# Patient Record
Sex: Male | Born: 1950 | Race: Black or African American | Hispanic: No | Marital: Single | Smoking: Former smoker
Health system: Southern US, Community
[De-identification: ages and names within clinical notes are randomized; demographics above are authoritative.]

## PROBLEM LIST (undated history)

## (undated) DIAGNOSIS — F1910 Other psychoactive substance abuse, uncomplicated: Secondary | ICD-10-CM

## (undated) DIAGNOSIS — I5022 Chronic systolic (congestive) heart failure: Secondary | ICD-10-CM

## (undated) DIAGNOSIS — I700 Atherosclerosis of aorta: Secondary | ICD-10-CM

## (undated) DIAGNOSIS — N189 Chronic kidney disease, unspecified: Secondary | ICD-10-CM

## (undated) DIAGNOSIS — E039 Hypothyroidism, unspecified: Secondary | ICD-10-CM

## (undated) DIAGNOSIS — I2510 Atherosclerotic heart disease of native coronary artery without angina pectoris: Secondary | ICD-10-CM

## (undated) DIAGNOSIS — G4730 Sleep apnea, unspecified: Secondary | ICD-10-CM

## (undated) DIAGNOSIS — I10 Essential (primary) hypertension: Secondary | ICD-10-CM

## (undated) DIAGNOSIS — Z952 Presence of prosthetic heart valve: Secondary | ICD-10-CM

## (undated) DIAGNOSIS — H9190 Unspecified hearing loss, unspecified ear: Secondary | ICD-10-CM

## (undated) DIAGNOSIS — J449 Chronic obstructive pulmonary disease, unspecified: Secondary | ICD-10-CM

## (undated) DIAGNOSIS — M1990 Unspecified osteoarthritis, unspecified site: Secondary | ICD-10-CM

## (undated) DIAGNOSIS — E079 Disorder of thyroid, unspecified: Secondary | ICD-10-CM

## (undated) HISTORY — PX: LIVER BIOPSY: SHX301

## (undated) HISTORY — DX: Chronic obstructive pulmonary disease, unspecified: J44.9

## (undated) HISTORY — DX: Atherosclerosis of aorta: I70.0

## (undated) HISTORY — DX: Chronic systolic (congestive) heart failure: I50.22

## (undated) HISTORY — DX: Atherosclerotic heart disease of native coronary artery without angina pectoris: I25.10

## (undated) HISTORY — DX: Presence of prosthetic heart valve: Z95.2

## (undated) HISTORY — PX: COLONOSCOPY: SHX174

## (undated) HISTORY — DX: Chronic kidney disease, unspecified: N18.9

## (undated) HISTORY — DX: Disorder of thyroid, unspecified: E07.9

---

## 2012-06-07 ENCOUNTER — Encounter (HOSPITAL_COMMUNITY): Payer: Self-pay | Admitting: *Deleted

## 2012-06-07 ENCOUNTER — Emergency Department (HOSPITAL_COMMUNITY)
Admission: EM | Admit: 2012-06-07 | Discharge: 2012-06-07 | Disposition: A | Discharge status: Home / Self Care | Payer: Medicare Other | Attending: Emergency Medicine | Admitting: Emergency Medicine

## 2012-06-07 DIAGNOSIS — E119 Type 2 diabetes mellitus without complications: Secondary | ICD-10-CM | POA: Insufficient documentation

## 2012-06-07 DIAGNOSIS — K0889 Other specified disorders of teeth and supporting structures: Secondary | ICD-10-CM

## 2012-06-07 DIAGNOSIS — F17200 Nicotine dependence, unspecified, uncomplicated: Secondary | ICD-10-CM | POA: Insufficient documentation

## 2012-06-07 DIAGNOSIS — K089 Disorder of teeth and supporting structures, unspecified: Secondary | ICD-10-CM | POA: Insufficient documentation

## 2012-06-07 MED ORDER — HYDROCODONE-ACETAMINOPHEN 5-325 MG PO TABS: 1.0000 | ORAL_TABLET | ORAL | Status: AC | PRN

## 2012-06-07 MED ORDER — HYDROCODONE-ACETAMINOPHEN 5-325 MG PO TABS
2.0000 | ORAL_TABLET | Freq: Once | ORAL | Status: AC
  Administered 2012-06-07: 2 via ORAL
  Filled 2012-06-07: qty 2

## 2012-06-07 NOTE — ED Provider Notes (Signed)
History     CSN: [id number]  Arrival date & time 06/07/12  0516   First MD Initiated Contact with Patient 06/07/12 [phone]      Chief Complaint  Patient presents with  . Dental Pain    (Consider location/radiation/quality/duration/timing/severity/associated sxs/prior treatment) HPI  Gordon Robinson is a 61 y.o. male who presents to the Emergency Department complaining of dental pain that has been worse since yesterday. [facility] and begun on Keflex and naprosyn. Last night the tooth became so painful he was unable to sleep. Denies fever, chills, facial swelling, difficulty swallowing.   [facility]  Past Medical History  Diagnosis Date  . Diabetes mellitus     History reviewed. No pertinent past surgical history.  History reviewed. No pertinent family history.  History  Substance Use Topics  . Smoking status: Current Everyday Smoker -- 1.0 packs/day  . Smokeless tobacco: Not on file  . Alcohol Use: No      Review of Systems  Constitutional: Negative for fever.       10 Systems reviewed and are negative for acute change except as noted in the HPI.  HENT: Positive for dental problem. Negative for congestion.   Eyes: Negative for discharge and redness.  Respiratory: Negative for cough and shortness of breath.   Cardiovascular: Negative for chest pain.  Gastrointestinal: Negative for vomiting and abdominal pain.  Musculoskeletal: Negative for back pain.  Skin: Negative for rash.  Neurological: Negative for syncope, numbness and headaches.  Psychiatric/Behavioral:       No behavior change.    Allergies  Review of patient's allergies indicates no known allergies.  Home Medications  No current outpatient prescriptions on file.  BP 157/102  Pulse 60  Temp 97.9 F (36.6 C) (Oral)  Resp 18  Ht 5\' 11"  (1.803 m)  Wt 200 lb (90.719 kg)  BMI 27.89 kg/m2  SpO2 100%  Physical Exam  Nursing note and vitals reviewed. Constitutional:  He appears well-developed and well-nourished.       Awake, alert, nontoxic appearance.  HENT:  Head: Atraumatic.       Dentition in fair repair with multiple fillings. Top 1st molar with missing filling. No abscess noted. No swelling of gums.   Eyes: Right eye exhibits no discharge. Left eye exhibits no discharge.  Neck: Neck supple.  Cardiovascular: Normal heart sounds.   Pulmonary/Chest: Effort normal and breath sounds normal. He exhibits no tenderness.  Abdominal: Soft. There is no tenderness. There is no rebound.  Musculoskeletal: He exhibits no tenderness.       Baseline ROM, no obvious new focal weakness.  Neurological:       Mental status and motor strength appears baseline for patient and situation.  Skin: No rash noted.  Psychiatric: He has a normal mood and affect.    ED Course  Procedures (including critical care time)    MDM  Patient with dental pain on antibiotics and antiinflammatory. Given analgesic. [facility]. Pt stable in ED with no significant deterioration in condition.The patient appears reasonably screened and/or stabilized for discharge and I doubt any other medical condition or other [facility] requiring further screening, evaluation, or treatment in the ED at this time prior to discharge.  MDM Reviewed: nursing note and vitals           Nicoletta Dress. Colon Branch, MD 06/07/12 [phone]

## 2012-06-07 NOTE — ED Notes (Signed)
Pt c/o pain to upper right side of jaw. Pt states he began to hurt yesterday but pain controled by medication. Pt states pain med not working this am.

## 2015-09-02 ENCOUNTER — Encounter (HOSPITAL_COMMUNITY): Payer: Self-pay | Admitting: Nurse Practitioner

## 2015-09-02 ENCOUNTER — Emergency Department (HOSPITAL_COMMUNITY)
Admission: EM | Admit: 2015-09-02 | Discharge: 2015-09-02 | Disposition: A | Discharge status: Home / Self Care | Payer: Medicare Other | Attending: Emergency Medicine | Admitting: Emergency Medicine

## 2015-09-02 ENCOUNTER — Emergency Department (HOSPITAL_COMMUNITY): Payer: Medicare Other

## 2015-09-02 DIAGNOSIS — F10129 Alcohol abuse with intoxication, unspecified: Secondary | ICD-10-CM | POA: Diagnosis not present

## 2015-09-02 DIAGNOSIS — S199XXA Unspecified injury of neck, initial encounter: Secondary | ICD-10-CM | POA: Diagnosis not present

## 2015-09-02 DIAGNOSIS — S0990XA Unspecified injury of head, initial encounter: Secondary | ICD-10-CM | POA: Insufficient documentation

## 2015-09-02 DIAGNOSIS — S0081XA Abrasion of other part of head, initial encounter: Secondary | ICD-10-CM | POA: Diagnosis not present

## 2015-09-02 DIAGNOSIS — R0600 Dyspnea, unspecified: Secondary | ICD-10-CM | POA: Diagnosis not present

## 2015-09-02 DIAGNOSIS — Y9389 Activity, other specified: Secondary | ICD-10-CM | POA: Insufficient documentation

## 2015-09-02 DIAGNOSIS — Y9289 Other specified places as the place of occurrence of the external cause: Secondary | ICD-10-CM | POA: Insufficient documentation

## 2015-09-02 DIAGNOSIS — IMO0002 Reserved for concepts with insufficient information to code with codable children: Secondary | ICD-10-CM

## 2015-09-02 DIAGNOSIS — Z23 Encounter for immunization: Secondary | ICD-10-CM | POA: Diagnosis not present

## 2015-09-02 DIAGNOSIS — S0993XA Unspecified injury of face, initial encounter: Secondary | ICD-10-CM | POA: Diagnosis present

## 2015-09-02 DIAGNOSIS — F17200 Nicotine dependence, unspecified, uncomplicated: Secondary | ICD-10-CM | POA: Diagnosis not present

## 2015-09-02 DIAGNOSIS — Y998 Other external cause status: Secondary | ICD-10-CM | POA: Insufficient documentation

## 2015-09-02 DIAGNOSIS — E119 Type 2 diabetes mellitus without complications: Secondary | ICD-10-CM | POA: Diagnosis not present

## 2015-09-02 LAB — ETHANOL: ALCOHOL ETHYL (B): 293 mg/dL — AB (ref <5)

## 2015-09-02 MED ORDER — SODIUM CHLORIDE 0.9 % IV SOLN: Freq: Once | INTRAVENOUS | Status: DC

## 2015-09-02 MED ORDER — TETANUS-DIPHTH-ACELL PERTUSSIS 5-2.5-18.5 LF-MCG/0.5 IM SUSP
0.5000 mL | Freq: Once | INTRAMUSCULAR | Status: AC
  Administered 2015-09-02: 0.5 mL via INTRAMUSCULAR
  Filled 2015-09-02: qty 0.5
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
## 2015-09-02 NOTE — ED Notes (Signed)
Resting quietly with eye closed. Easily arousable. Verbally responsive. Resp even and unlabored. ABC's intact. NAD noted.  

## 2015-09-02 NOTE — ED Notes (Signed)
Pt returned from CT °

## 2015-09-02 NOTE — ED Notes (Addendum)
Pt awake. Verbally responsive. Resp even and unlabored. No audible adventitious breath sounds noted. Pt eaten 100% of breakfast. Ambulated to bathroom with steady gait. Removed IV 18g from LAC. Pt tolerated well.

## 2015-09-02 NOTE — ED Notes (Signed)
Pt had taken off c collar,  Advised multiple times to adhere to medical advise, keep collar on,  Lay on bed
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
## 2015-09-02 NOTE — Discharge Instructions (Signed)
Abrasion  ° °An abrasion is a cut or scrape on the outer surface of your skin. An abrasion does not extend through all of the layers of your skin. It is important to care for your abrasion properly to prevent infection.  °CAUSES  °Most abrasions are caused by falling on or gliding across the ground or another surface. When your skin rubs on something, the outer and inner layer of skin rubs off.  °SYMPTOMS  °A cut or scrape is the main symptom of this condition. The scrape may be bleeding, or it may appear red or pink. If there was an associated fall, there may be an underlying bruise.  °DIAGNOSIS  °An abrasion is diagnosed with a physical exam.  °TREATMENT  °Treatment for this condition depends on how large and deep the abrasion is. Usually, your abrasion will be cleaned with water and mild soap. This removes any dirt or debris that may be stuck. An antibiotic ointment may be applied to the abrasion to help prevent infection. A bandage (dressing) may be placed on the abrasion to keep it clean.  °You may also need a tetanus shot.  °HOME CARE INSTRUCTIONS  °Medicines  °Take or apply medicines only as directed by your health care provider.  °If you were prescribed an antibiotic ointment, finish all of it even if you start to feel better. °Wound Care  °Clean the wound with mild soap and water 2-3 times per day or as directed by your health care provider. Pat your wound dry with a clean towel. Do not rub it.  °There are many different ways to close and cover a wound. Follow instructions from your health care provider about:  °Wound care.  °Dressing changes and removal. °Check your wound every day for signs of infection. Watch for:  °Redness, swelling, or pain.  °Fluid, blood, or pus. °General Instructions  ° °Keep the dressing dry as directed by your health care provider. Do not take baths, swim, use a hot tub, or do anything that would put your wound underwater until your health care provider approves.  °If there is  swelling, raise (elevate) the injured area above the level of your heart while you are sitting or lying down.  °Keep all follow-up visits as directed by your health care provider. This is important. °SEEK MEDICAL CARE IF:  °You received a tetanus shot and you have swelling, severe pain, redness, or bleeding at the injection site.  °Your pain is not controlled with medicine.  °You have increased redness, swelling, or pain at the site of your wound. °SEEK IMMEDIATE MEDICAL CARE IF:  °You have a red streak going away from your wound.  °You have a fever.  °You have fluid, blood, or pus coming from your wound.  °You notice a bad smell coming from your wound or your dressing. °This information is not intended to replace advice given to you by your health care provider. Make sure you discuss any questions you have with your health care provider.  °Document Released: 07/30/2005 Document Revised: 07/11/2015 Document Reviewed: 10/18/2014  °Elsevier Interactive Patient Education ©2016 Elsevier Inc.  ° ° °Emergency Department Resource Guide °1) Find a Doctor and Pay Out of Pocket °Although you won't have to find out who is covered by your insurance plan, it is a good idea to ask around and get recommendations. You will then need to call the office and see if the doctor you have chosen will accept you as a new patient and what types of   options they offer for patients who are self-pay. Some doctors offer discounts or will set up payment plans for their patients who do not have insurance, but you will need to ask so you aren't surprised when you get to your appointment. ° °2) Contact Your Local Health Department °Not all health departments have doctors that can see patients for sick visits, but many do, so it is worth a call to see if yours does. If you don't know where your local health department is, you can check in your phone book. The CDC also has a tool to help you locate your state's health department, and many state  websites also have listings of all of their local health departments. ° °3) Find a Walk-in Clinic °If your illness is not likely to be very severe or complicated, you may want to try a walk in clinic. These are popping up all over the country in pharmacies, drugstores, and shopping centers. They're usually staffed by nurse practitioners or physician assistants that have been trained to treat common illnesses and complaints. They're usually fairly quick and inexpensive. However, if you have serious medical issues or chronic medical problems, these are probably not your best option. ° °No Primary Care Doctor: °- Call [facility] at  832-8000 - they can help you locate a primary care doctor that  accepts your insurance, provides certain services, etc. °- Physician Referral Service- 1-800-533-3463 ° °Chronic Pain Problems: °Organization         Address  Phone   Notes  °[facility]  (336) 297-2271 Patients need to be referred by their primary care doctor.  ° °Medication Assistance: °Organization         Address  Phone   Notes  °[place] [facility] [address] °[place], [place] [address] (336) 641-8030 --Must be a resident of [place] °-- Must have NO insurance coverage whatsoever (no Medicaid/ Medicare, etc.) °-- The pt. MUST have a primary care doctor that directs their care regularly and follows them in the community °  °[facility]  (866) 331-1348   °[employer]  (888) 892-1162   ° °Agencies that provide inexpensive medical care: °Organization         Address  Phone   Notes  °[facility]  (336) 832-8035   °[facility]    (336) 832-7272   °[facility] [address] °[place], [place] [address] (336) 832-4777   °[facility] [address], °[place] (336) 271-4999   °[facility]    (336) 373-0678   °[facility]    (336) 272-1050   °[facility] ° [address], [place] Phone:  (336) 832-4444, Fax:  (336) 832-4440 Hours of Operation:  9 am - 6 pm, M-F.  Also accepts Medicaid/Medicare and self-pay.  °[facility] ° [address], [place] Phone: (336) 832-3150, Fax: (336) [phone]. Hours of Operation:  8:30 am - 5:30 pm, M-F.  Also accepts Medicaid and self-pay.  °[facility] [place] [address], [place] Phone: (336) 878-6027   °[facility] [address], [place], [place] (336)723-1848, Ext. 123 Mondays & Thursdays: 7-9 AM.  First 15 patients are seen on a first come, first serve basis. °  ° °Medicaid-accepting [place] Providers: ° °Organization         Address  Phone   Notes  °  [facility] [address], [place] (336) 641-2100 Also accepts self-pay patients.  °[facility] [address], [place] ° (336) 856-9996   °[facility] [address], [place] (336) 288-8857   °[facility] [address] [place] [address], [place] (336) 299-7000   °Veita Bland [address], [place]  ° (336) 373-1557 Only accepts [place] Access Medicaid patients after they have their name applied to their card.  ° °Self-Pay (no insurance) in [place]: ° °Organization         Address  Phone   Notes  °Sickle Cell Patients, [facility] [address], [place] (336) 832-1970   °[facility] [address], [place] (336) 832-4400   °[facility] ° [address], [place] (336) 992-4800   °[facility]/Dr. Osei-Bonsu ° [address] [place] [address], [place] or [address], [place] (336) [phone] Phone number for both [place] and [place] locations is the same.  °[facility] [address], [place] (336) 299-0000   °[facility] [address] [place] [address], [place] or [address] (336) 852-7530 °(336) 878-2260   °[facility] [address], [place] (336) 350-1642, phone; (336) 294-5005, fax Sees patients 1st and 3rd Saturday of every month.  Must not qualify for public or private insurance (i.e. Medicaid, Medicare, [place] Health Choice, Veterans' Benefits) • Household income should be no more than 200% of the poverty level •The clinic cannot treat you if you are pregnant or think you are pregnant • Sexually transmitted diseases are not treated at the clinic.  ° ° °Dental Care: °Organization         Address  Phone  Notes  °[facility] [address],  (336) 641-6152 Accepts children up to age 21 who are enrolled in Medicaid or [place] Health Choice; pregnant women with a Medicaid card; and children who have applied for Medicaid or [place] Health Choice, but were declined, whose parents can pay a reduced fee at time of service.  °[facility] [place]  [address], [place] (336) 641-7733 Accepts children up to age 21 who are enrolled in Medicaid or [place] Health Choice; pregnant women with a Medicaid card; and children who have applied for Medicaid or [place] Health Choice, but were declined, whose parents can pay a reduced fee at time of service.  °[facility] ° [address],  (336) 641-4533 Patients are seen by appointment only. Walk-ins are not accepted. [facility] will see patients 18 years of age and older. °Monday - Tuesday (8am-5pm) °Most Wednesdays (8:30-5pm) °$30 per visit, cash only  °[facility] ° [address], [place] (336) [phone] Patients are seen by appointment only. Walk-ins are not accepted. [facility] will see patients 18 years of age and older. °One Wednesday Evening (Monthly: Volunteer Based).  $30 per visit, cash only  °[facility]  (919) [phone] for adults; Children under age 4, call Graduate Pediatric Dentistry at ([phone]) [phone]. Children aged 4-14, please call ([phone]) [phone] to request a pediatric application. ° Dental services are provided in all areas of dental care including fillings, crowns and bridges, complete and partial   dentures, implants, gum treatment, root canals, and extractions. Preventive care is also provided. Treatment is provided to both adults and children. °Patients are selected via a lottery and there is often a waiting list. °  °[facility] [address], °[place] ° (336) 763-8833 [email or web address] °  °[facility] [address], [place], [place] (336)723-1848, Ext. 123 Second and Fourth Thursday of each month, opens at 6:30 AM; Clinic ends at 9 AM.  Patients are seen on a first-come first-served basis, and a limited number are seen during each clinic.  ° °[facility] ° [address], [place], [place] (336) [phone]   Eligibility Requirements °You must have lived in [place], [place], or [place] counties for at least the last three months. °  You cannot be eligible for state or federal sponsored healthcare insurance, including Veterans Administration, Medicaid, or Medicare. °  You generally cannot be eligible for healthcare insurance through your employer.  °  How to apply: °Eligibility screenings are held every Tuesday and Wednesday afternoon from 1:00 pm until 4:00 pm. You do not need an appointment for the interview!  °[facility] [address], [place], [place] 336-631-2330   °[facility]  336-342-8273   °[facility]  336-703-3100   °[facility]  336-570-6415   ° °Behavioral Health Resources in the Community: °Intensive Outpatient Programs °Organization         Address  Phone  Notes  °[facility] [address], [place], [place]  336-878-6098   °[facility] [address], [place], [place] 336-832-9800   °[facility] [address], [place], [place] ° 336-882-2125   °[facility] [address],  °[place], [place] 1-800-853-5163 or 336-641-4981   °Substance Abuse Resources °Organization         Address  Phone  Notes  °[facility]  336-882-2125   °[facility]  336-784-9470   °[facility]  336-285-9073   °[facility]  336-845-3988   °Residential & Outpatient Substance Abuse Program  1-800-659-3381   °Psychological Services °Organization         Address  Phone  Notes  °[facility]  336- 832-9600   °[facility]  336- 378-7881   °[facility] [address], [place] 1-800-853-5163 or 336-641-4981   ° °Mobile Crisis Teams °Organization         Address  Phone  Notes  °[facility], Mobile Crisis Care Unit  1-877-626-1772   °Assertive °[facility] ° [address] [place], [place] 336-834-9664   °Sharon DeEsch [address] °[place] 336-554-5454   ° °Self-Help/Support Groups °Organization         Address  Phone             Notes  °[facility] [place] - variety of support groups  336- 373-1402 Call for more information  °Narcotics Anonymous (NA), [facility] [address], °[place]  2 meetings at this location  ° °Residential Treatment Programs °Organization         Address  Phone  Notes  °[facility] [address],    °[place]  1-866-801-8205   °[facility] ° [address], [place], [place] 704-293-8524   °[facility] [address], [place] 336-845-3988 Admissions: 8am-3pm M-F  °[facility] [address]    °[place], [place] 336-841-1104   °[facility] [address], [place], [place] 336-379-7146   °[facility] [address]    °[place], [place] 336-285-9073   °[facility] Intensive Outpatient [address], [place],  [place] 336-852-3033   °[facility] [address]  °[place], [place] 1-877-615-2722 or 336-784-9470   °[facility] [address] [place], [place] 336-227-7417 Accepts Medicaid  °[facility] [address]  ° [place] 1-800-659-3381 Substance Abuse/Addiction Treatment  ° °[place] Behavioral Health Resources °Organization         Address  Phone  Notes  °[facility]  (888) 581-9988   °Julie Brannon, PhD [address] [place], [place]   (336) 349-5553 or (336) 951-0000   °[facility]   [address] °[place], [place] (336) 349-4454   °[facility] [address], [place], [place] (336) 342-8316 Insurance/Medicaid/sponsorship through [facility]  °[facility] [address]                                    [place], [place] (336) 342-8316 Therapy/tele-psych/case  °[facility] [address]  ° [place], [place] (336) 349-2233    °Dr. Arfeen  (336) 349-4544   °[facility] [place]  [employer] [place] [facility] 1) [address], [place] °2) [address], [place] °3)  [address], [place] (336) 349-3220 °(336) 342-7768 ° °(336) 342-8140   °[place] [facility] (336) 342-1394 or (336) 342-3537 (After Hours)    ° ° ° °

## 2015-09-02 NOTE — ED Notes (Signed)
Awake. Verbally responsive. A/O x4. Resp even and unlabored. No audible adventitious breath sounds noted. ABC's intact.  

## 2015-11-24 ENCOUNTER — Encounter (HOSPITAL_COMMUNITY): Payer: Self-pay | Admitting: Emergency Medicine

## 2015-11-24 ENCOUNTER — Emergency Department (HOSPITAL_COMMUNITY)
Admission: EM | Admit: 2015-11-24 | Discharge: 2015-11-24 | Disposition: A | Discharge status: Home / Self Care | Payer: Medicare Other | Attending: Emergency Medicine | Admitting: Emergency Medicine

## 2015-11-24 DIAGNOSIS — S0992XA Unspecified injury of nose, initial encounter: Secondary | ICD-10-CM | POA: Diagnosis present

## 2015-11-24 DIAGNOSIS — S0121XA Laceration without foreign body of nose, initial encounter: Secondary | ICD-10-CM | POA: Diagnosis not present

## 2015-11-24 DIAGNOSIS — Z79899 Other long term (current) drug therapy: Secondary | ICD-10-CM | POA: Diagnosis not present

## 2015-11-24 DIAGNOSIS — Y9289 Other specified places as the place of occurrence of the external cause: Secondary | ICD-10-CM | POA: Diagnosis not present

## 2015-11-24 DIAGNOSIS — S60312A Abrasion of left thumb, initial encounter: Secondary | ICD-10-CM | POA: Insufficient documentation

## 2015-11-24 DIAGNOSIS — F17210 Nicotine dependence, cigarettes, uncomplicated: Secondary | ICD-10-CM | POA: Insufficient documentation

## 2015-11-24 DIAGNOSIS — Y9389 Activity, other specified: Secondary | ICD-10-CM | POA: Insufficient documentation

## 2015-11-24 DIAGNOSIS — S51812A Laceration without foreign body of left forearm, initial encounter: Secondary | ICD-10-CM | POA: Diagnosis not present

## 2015-11-24 DIAGNOSIS — Z794 Long term (current) use of insulin: Secondary | ICD-10-CM | POA: Insufficient documentation

## 2015-11-24 DIAGNOSIS — Y998 Other external cause status: Secondary | ICD-10-CM | POA: Diagnosis not present

## 2015-11-24 DIAGNOSIS — E119 Type 2 diabetes mellitus without complications: Secondary | ICD-10-CM | POA: Diagnosis not present

## 2015-11-24 DIAGNOSIS — Z7951 Long term (current) use of inhaled steroids: Secondary | ICD-10-CM | POA: Diagnosis not present

## 2015-11-24 LAB — I-STAT CHEM 8, ED
BUN: 15 mg/dL (ref 6–20)
CREATININE: 0.9 mg/dL (ref 0.61–1.24)
Calcium, Ion: 1.15 mmol/L (ref 1.13–1.30)
Chloride: 104 mmol/L (ref 101–111)
GLUCOSE: 234 mg/dL — AB (ref 65–99)
HCT: 45 % (ref 39.0–52.0)
HEMOGLOBIN: 15.3 g/dL (ref 13.0–17.0)
POTASSIUM: 3.9 mmol/L (ref 3.5–5.1)
Sodium: 138 mmol/L (ref 135–145)
TCO2: 21 mmol/L (ref 0–100)

## 2015-11-24 MED ORDER — BACITRACIN ZINC 500 UNIT/GM EX OINT
TOPICAL_OINTMENT | Freq: Once | CUTANEOUS | Status: AC
  Administered 2015-11-24: 14:00:00 via TOPICAL
  Filled 2015-11-24: qty 0.9

## 2015-11-24 NOTE — Discharge Instructions (Signed)
Facial Laceration ° A facial laceration is a cut on the face. These injuries can be painful and cause bleeding. Lacerations usually heal quickly, but they need special care to reduce scarring. °DIAGNOSIS  °Your health care provider will take a medical history, ask for details about how the injury occurred, and examine the wound to determine how deep the cut is. °TREATMENT  °Some facial lacerations may not require closure. Others may not be able to be closed because of an increased risk of infection. The risk of infection and the chance for successful closure will depend on various factors, including the amount of time since the injury occurred. °The wound may be cleaned to help prevent infection. If closure is appropriate, pain medicines may be given if needed. Your health care provider will use stitches (sutures), wound glue (adhesive), or skin adhesive strips to repair the laceration. These tools bring the skin edges together to allow for faster healing and a better cosmetic outcome. If needed, you may also be given a tetanus shot. °HOME CARE INSTRUCTIONS °· Only take over-the-counter or prescription medicines as directed by your health care provider. °· Follow your health care provider's instructions for wound care. These instructions will vary depending on the technique used for closing the wound. °For Sutures: °· Keep the wound clean and dry.   °· If you were given a bandage (dressing), you should change it at least once a day. Also change the dressing if it becomes wet or dirty, or as directed by your health care provider.   °· Wash the wound with soap and water 2 times a day. Rinse the wound off with water to remove all soap. Pat the wound dry with a clean towel.   °· After cleaning, apply a thin layer of the antibiotic ointment recommended by your health care provider. This will help prevent infection and keep the dressing from sticking.   °· You may shower as usual after the first 24 hours. Do not soak the  wound in water until the sutures are removed.   °· Get your sutures removed as directed by your health care provider. With facial lacerations, sutures should usually be taken out after 4-5 days to avoid stitch marks.   °· Wait a few days after your sutures are removed before applying any makeup. °For Skin Adhesive Strips: °· Keep the wound clean and dry.   °· Do not get the skin adhesive strips wet. You may bathe carefully, using caution to keep the wound dry.   °· If the wound gets wet, pat it dry with a clean towel.   °· Skin adhesive strips will fall off on their own. You may trim the strips as the wound heals. Do not remove skin adhesive strips that are still stuck to the wound. They will fall off in time.   °For Wound Adhesive: °· You may briefly wet your wound in the shower or bath. Do not soak or scrub the wound. Do not swim. Avoid periods of heavy sweating until the skin adhesive has fallen off on its own. After showering or bathing, gently pat the wound dry with a clean towel.   °· Do not apply liquid medicine, cream medicine, ointment medicine, or makeup to your wound while the skin adhesive is in place. This may loosen the film before your wound is healed.   °· If a dressing is placed over the wound, be careful not to apply tape directly over the skin adhesive. This may cause the adhesive to be pulled off before the wound is healed.   °· Avoid   prolonged exposure to sunlight or tanning lamps while the skin adhesive is in place. °· The skin adhesive will usually remain in place for 5-10 days, then naturally fall off the skin. Do not pick at the adhesive film.   °After Healing: °Once the wound has healed, cover the wound with sunscreen during the day for 1 full year. This can help minimize scarring. Exposure to ultraviolet light in the first year will darken the scar. It can take 1-2 years for the scar to lose its redness and to heal completely.  °SEEK MEDICAL CARE IF: °· You have a fever. °SEEK IMMEDIATE  MEDICAL CARE IF: °· You have redness, pain, or swelling around the wound.   °· You see a yellowish-white fluid (pus) coming from the wound.   °  °This information is not intended to replace advice given to you by your health care provider. Make sure you discuss any questions you have with your health care provider. °  °Document Released: 11/27/2004 Document Revised: 11/10/2014 Document Reviewed: 06/02/2013 °Elsevier Interactive Patient Education ©2016 [employer]vier Inc. ° °
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
## 2016-01-25 ENCOUNTER — Emergency Department (HOSPITAL_COMMUNITY)
Admission: EM | Admit: 2016-01-25 | Discharge: 2016-01-25 | Disposition: A | Discharge status: Home / Self Care | Payer: Medicare Other | Source: Home / Self Care

## 2016-01-25 ENCOUNTER — Encounter (HOSPITAL_COMMUNITY): Payer: Self-pay | Admitting: Emergency Medicine

## 2016-01-25 ENCOUNTER — Emergency Department (HOSPITAL_COMMUNITY)
Admission: EM | Admit: 2016-01-25 | Discharge: 2016-01-25 | Disposition: A | Discharge status: Home / Self Care | Payer: Medicare Other | Attending: Emergency Medicine | Admitting: Emergency Medicine

## 2016-01-25 ENCOUNTER — Emergency Department (HOSPITAL_COMMUNITY)
Admission: EM | Admit: 2016-01-25 | Discharge: 2016-01-26 | Disposition: A | Discharge status: Home / Self Care | Payer: Medicare Other | Attending: Emergency Medicine | Admitting: Emergency Medicine

## 2016-01-25 ENCOUNTER — Encounter (HOSPITAL_COMMUNITY): Payer: Self-pay

## 2016-01-25 DIAGNOSIS — Z5321 Procedure and treatment not carried out due to patient leaving prior to being seen by health care provider: Secondary | ICD-10-CM | POA: Insufficient documentation

## 2016-01-25 DIAGNOSIS — Z79899 Other long term (current) drug therapy: Secondary | ICD-10-CM | POA: Insufficient documentation

## 2016-01-25 DIAGNOSIS — Z794 Long term (current) use of insulin: Secondary | ICD-10-CM | POA: Insufficient documentation

## 2016-01-25 DIAGNOSIS — F17210 Nicotine dependence, cigarettes, uncomplicated: Secondary | ICD-10-CM | POA: Insufficient documentation

## 2016-01-25 DIAGNOSIS — R739 Hyperglycemia, unspecified: Secondary | ICD-10-CM

## 2016-01-25 DIAGNOSIS — T83038A Leakage of other indwelling urethral catheter, initial encounter: Secondary | ICD-10-CM | POA: Insufficient documentation

## 2016-01-25 DIAGNOSIS — E119 Type 2 diabetes mellitus without complications: Secondary | ICD-10-CM | POA: Insufficient documentation

## 2016-01-25 DIAGNOSIS — T839XXA Unspecified complication of genitourinary prosthetic device, implant and graft, initial encounter: Secondary | ICD-10-CM

## 2016-01-25 DIAGNOSIS — E1165 Type 2 diabetes mellitus with hyperglycemia: Secondary | ICD-10-CM | POA: Insufficient documentation

## 2016-01-25 DIAGNOSIS — Z9114 Patient's other noncompliance with medication regimen: Secondary | ICD-10-CM

## 2016-01-25 DIAGNOSIS — R339 Retention of urine, unspecified: Secondary | ICD-10-CM | POA: Insufficient documentation

## 2016-01-25 DIAGNOSIS — Y732 Prosthetic and other implants, materials and accessory gastroenterology and urology devices associated with adverse incidents: Secondary | ICD-10-CM | POA: Diagnosis not present

## 2016-01-25 DIAGNOSIS — R338 Other retention of urine: Secondary | ICD-10-CM

## 2016-01-25 LAB — URINE MICROSCOPIC-ADD ON
SQUAMOUS EPITHELIAL / LPF: NONE SEEN
WBC, UA: NONE SEEN WBC/hpf (ref 0–5)

## 2016-01-25 LAB — CBC WITH DIFFERENTIAL/PLATELET
Basophils Absolute: 0 10*3/uL (ref 0.0–0.1)
Basophils Relative: 0 %
EOS ABS: 0 10*3/uL (ref 0.0–0.7)
EOS PCT: 0 %
HCT: 42 % (ref 39.0–52.0)
HEMOGLOBIN: 14.2 g/dL (ref 13.0–17.0)
LYMPHS ABS: 1.4 10*3/uL (ref 0.7–4.0)
Lymphocytes Relative: 16 %
MCH: 29.6 pg (ref 26.0–34.0)
MCHC: 33.8 g/dL (ref 30.0–36.0)
MCV: 87.7 fL (ref 78.0–100.0)
MONOS PCT: 7 %
Monocytes Absolute: 0.6 10*3/uL (ref 0.1–1.0)
Neutro Abs: 6.7 10*3/uL (ref 1.7–7.7)
Neutrophils Relative %: 77 %
Platelets: 220 10*3/uL (ref 150–400)
RBC: 4.79 MIL/uL (ref 4.22–5.81)
RDW: 12.4 % (ref 11.5–15.5)
WBC: 8.8 10*3/uL (ref 4.0–10.5)

## 2016-01-25 LAB — URINALYSIS, ROUTINE W REFLEX MICROSCOPIC
BILIRUBIN URINE: NEGATIVE
KETONES UR: NEGATIVE mg/dL
Leukocytes, UA: NEGATIVE
Nitrite: NEGATIVE
PH: 5 (ref 5.0–8.0)
Protein, ur: NEGATIVE mg/dL
Specific Gravity, Urine: 1.005 (ref 1.005–1.030)

## 2016-01-25 LAB — CBG MONITORING, ED
GLUCOSE-CAPILLARY: 408 mg/dL — AB (ref 65–99)
Glucose-Capillary: >600 mg/dL (ref 65–99)

## 2016-01-25 LAB — BASIC METABOLIC PANEL
Anion gap: 12 (ref 5–15)
BUN: 20 mg/dL (ref 6–20)
CHLORIDE: 93 mmol/L — AB (ref 101–111)
CO2: 22 mmol/L (ref 22–32)
CREATININE: 1.3 mg/dL — AB (ref 0.61–1.24)
Calcium: 9.6 mg/dL (ref 8.9–10.3)
GFR calc Af Amer: >60 mL/min (ref >60)
GFR calc non Af Amer: 57 mL/min — ABNORMAL LOW (ref >60)
Glucose, Bld: 778 mg/dL (ref 65–99)
Potassium: 4.8 mmol/L (ref 3.5–5.1)
Sodium: 127 mmol/L — ABNORMAL LOW (ref 135–145)

## 2016-01-25 MED ORDER — INSULIN REGULAR HUMAN 100 UNIT/ML IJ SOLN: 10.0000 [IU] | Freq: Three times a day (TID) | INTRAMUSCULAR | Status: DC

## 2016-01-25 MED ORDER — INSULIN ASPART 100 UNIT/ML ~~LOC~~ SOLN
12.0000 [IU] | Freq: Once | SUBCUTANEOUS | Status: AC
  Administered 2016-01-25: 12 [IU] via INTRAVENOUS
  Filled 2016-01-25: qty 1

## 2016-01-25 MED ORDER — INSULIN GLARGINE 100 UNIT/ML ~~LOC~~ SOLN: 25.0000 [IU] | Freq: Every day | SUBCUTANEOUS | Status: DC

## 2016-01-25 MED ORDER — HYDROGEN PEROXIDE 3 % EX SOLN
CUTANEOUS | Status: AC
  Filled 2016-01-25: qty 473

## 2016-01-25 MED ORDER — SODIUM CHLORIDE 0.9 % IV BOLUS (SEPSIS)
1000.0000 mL | Freq: Once | INTRAVENOUS | Status: AC
  Administered 2016-01-25: 1000 mL via INTRAVENOUS

## 2016-01-25 MED ORDER — SODIUM CHLORIDE 0.9 % IV BOLUS (SEPSIS)
1000.0000 mL | Freq: Once | INTRAVENOUS | Status: AC
Start: 2016-01-25 — End: 2016-01-25
  Administered 2016-01-25: 1000 mL via INTRAVENOUS

## 2016-01-25 NOTE — ED Notes (Signed)
Pt states he has not been able to void for two days. Pt was in the bathroom when called for triage. Stated he was able to void but does no know if his bladder empted

## 2016-01-25 NOTE — ED Notes (Signed)
CRITICAL VALUE ALERT  Critical value received: Glucose 778  Date of notification:  01/25/16  Time of notification:  1008 Critical value read back: Yes  Nurse who received alert: Tonia Ghentobin Knowles RN  MD notified Dr Patria Maneampos

## 2016-01-25 NOTE — ED Notes (Signed)
No answer in waiting areas.

## 2016-01-25 NOTE — ED Notes (Signed)
Pt states his catheter is leaking.

## 2016-01-25 NOTE — Discharge Instructions (Signed)
Acute Urinary Retention, Male Acute urinary retention is the temporary inability to urinate. This is a common problem in older men. As men age their prostates become larger and block the flow of urine from the bladder. This is usually a problem that has come on gradually.  HOME CARE INSTRUCTIONS If you are sent home with a Foley catheter and a drainage system, you will need to discuss the best course of action with your health care provider. While the catheter is in, maintain a good intake of fluids. Keep the drainage bag emptied and lower than your catheter. This is so that contaminated urine will not flow back into your bladder, which could lead to a urinary tract infection. There are two main types of drainage bags. One is a large bag that usually is used at night. It has a good capacity that will allow you to sleep through the night without having to empty it. The second type is called a leg bag. It has a smaller capacity, so it needs to be emptied more frequently. However, the main advantage is that it can be attached by a leg strap and can go underneath your clothing, allowing you the freedom to move about or leave your home. Only take over-the-counter or prescription medicines for pain, discomfort, or fever as directed by your health care provider.  SEEK MEDICAL CARE IF:  You develop a low-grade fever.  You experience spasms or leakage of urine with the spasms. SEEK IMMEDIATE MEDICAL CARE IF:   You develop chills or fever.  Your catheter stops draining urine.  Your catheter falls out.  You start to develop increased bleeding that does not respond to rest and increased fluid intake. MAKE SURE YOU:  Understand these instructions.  Will watch your condition.  Will get help right away if you are not doing well or get worse.   This information is not intended to replace advice given to you by your health care provider. Make sure you discuss any questions you have with your health care  provider.   Document Released: 01/26/2001 Document Revised: 03/06/2015 Document Reviewed: 03/31/2013 Elsevier Interactive Patient Education 2016 [employer]  Hyperglycemia Hyperglycemia occurs when the glucose (sugar) in your blood is too high. Hyperglycemia can happen for many reasons, but it most often happens to people who do not know they have diabetes or are not managing their diabetes properly.  CAUSES  Whether you have diabetes or not, there are other causes of hyperglycemia. Hyperglycemia can occur when you have diabetes, but it can also occur in other situations that you might not be as aware of, such as: Diabetes  If you have diabetes and are having problems controlling your blood glucose, hyperglycemia could occur because of some of the following reasons:  Not following your meal plan.  Not taking your diabetes medications or not taking it properly.  Exercising less or doing less activity than you normally do.  Being sick. Pre-diabetes  This cannot be ignored. Before people develop Type 2 diabetes, they almost always have "pre-diabetes." This is when your blood glucose levels are higher than normal, but not yet high enough to be diagnosed as diabetes. Research has shown that some long-term damage to the body, especially the heart and circulatory system, may already be occurring during pre-diabetes. If you take action to manage your blood glucose when you have pre-diabetes, you may delay or prevent Type 2 diabetes from developing. Stress  If you have diabetes, you may be "diet" controlled or on  oral medications or insulin to control your diabetes. However, you may find that your blood glucose is higher than usual in the hospital whether you have diabetes or not. This is often referred to as "stress hyperglycemia." Stress can elevate your blood glucose. This happens because of hormones put out by the body during times of stress. If stress has been the cause of your high blood  glucose, it can be followed regularly by your caregiver. That way he/she can make sure your hyperglycemia does not continue to get worse or progress to diabetes. Steroids  Steroids are medications that act on the infection fighting system (immune system) to block inflammation or infection. One side effect can be a rise in blood glucose. Most people can produce enough extra insulin to allow for this rise, but for those who cannot, steroids make blood glucose levels go even higher. It is not unusual for steroid treatments to "uncover" diabetes that is developing. It is not always possible to determine if the hyperglycemia will go away after the steroids are stopped. A special blood test called an A1c is sometimes done to determine if your blood glucose was elevated before the steroids were started. SYMPTOMS  Thirsty.  Frequent urination.  Dry mouth.  Blurred vision.  Tired or fatigue.  Weakness.  Sleepy.  Tingling in feet or leg. DIAGNOSIS  Diagnosis is made by monitoring blood glucose in one or all of the following ways:  A1c test. This is a chemical found in your blood.  Fingerstick blood glucose monitoring.  Laboratory results. TREATMENT  First, knowing the cause of the hyperglycemia is important before the hyperglycemia can be treated. Treatment may include, but is not be limited to:  Education.  Change or adjustment in medications.  Change or adjustment in meal plan.  Treatment for an illness, infection, etc.  More frequent blood glucose monitoring.  Change in exercise plan.  Decreasing or stopping steroids.  Lifestyle changes. HOME CARE INSTRUCTIONS   Test your blood glucose as directed.  Exercise regularly. Your caregiver will give you instructions about exercise. Pre-diabetes or diabetes which comes on with stress is helped by exercising.  Eat wholesome, balanced meals. Eat often and at regular, fixed times. Your caregiver or nutritionist will give you a  meal plan to guide your sugar intake.  Being at an ideal weight is important. If needed, losing as little as 10 to 15 pounds may help improve blood glucose levels. SEEK MEDICAL CARE IF:   You have questions about medicine, activity, or diet.  You continue to have symptoms (problems such as increased thirst, urination, or weight gain). SEEK IMMEDIATE MEDICAL CARE IF:   You are vomiting or have diarrhea.  Your breath smells fruity.  You are breathing faster or slower.  You are very sleepy or incoherent.  You have numbness, tingling, or pain in your feet or hands.  You have chest pain.  Your symptoms get worse even though you have been following your caregiver's orders.  If you have any other questions or concerns.   This information is not intended to replace advice given to you by your health care provider. Make sure you discuss any questions you have with your health care provider.   Document Released: 04/15/2001 Document Revised: 01/12/2012 Document Reviewed: 06/26/2015 Elsevier Interactive Patient Education [employer]

## 2016-01-25 NOTE — ED Notes (Signed)
Pt had a foley placed today for urinary retention.  Pt states he thinks it is coming out because it is painful and leaking around the insertion site.  There is pale yellow urine in the bag.

## 2016-01-25 NOTE — ED Notes (Signed)
Pt reports not being able to empty his bladder for 2 days. Pt brought to room from triage, Triage RN bladder scanned pt and placed urinary catheter with approx 1000 ml output. Pt reports relief in symptoms.

## 2016-01-25 NOTE — ED Provider Notes (Signed)
CSN: [id number]     Arrival date & time 01/25/16  0902 History   First MD Initiated Contact with Patient 01/25/16 0919     Chief Complaint  Patient presents with  . Urinary Retention     (Consider location/radiation/quality/duration/timing/severity/associated sxs/prior Treatment) The history is provided by the patient.   Gordon Robinson is a 65 y.o. male with a past medical history of Insulin-dependent diabetes presenting with a 1 day history of urinary retention, stating he last urinated normally early yesterday morning.  Since he has had episodes of incontinent small dribbles of urine but has been unable to fully empty his bladder.  He also endorses increased thirst, chronic dry mouth and fatigue.  He is currently in the middle moving, states his insulin is in a box somewhere so has had no diabetes medications in approximately 1 month.  He denies abdominal pain, nausea or vomiting.  He endorses fatigue, primarily due to inability to sleep last night secondary to bladder distention and pain.  Since arriving here he has obtained a Foley catheter with 1 L of urine resulted, he feels much improved.  Denies prior history of urinary retention or prostate problems or UTI history.    Past Medical History  Diagnosis Date  . Diabetes mellitus    Past Surgical History  Procedure Laterality Date  . Liver biopsy     No family history on file. Social History  Substance Use Topics  . Smoking status: Current Every Day Smoker -- 0.50 packs/day    Types: Cigarettes  . Smokeless tobacco: None  . Alcohol Use: Yes     Comment: occassinally    Review of Systems  Constitutional: Positive for fatigue. Negative for fever and chills.  HENT: Negative for congestion and sore throat.   Eyes: Negative.   Respiratory: Negative for chest tightness and shortness of breath.   Cardiovascular: Negative for chest pain.  Gastrointestinal: Negative for nausea, vomiting, abdominal pain, diarrhea and constipation.   Genitourinary: Positive for difficulty urinating. Negative for hematuria and flank pain.       Pelvic pain  Musculoskeletal: Negative for joint swelling, arthralgias and neck pain.  Skin: Negative.  Negative for rash and wound.  Neurological: Negative for dizziness, weakness, light-headedness, numbness and headaches.  Psychiatric/Behavioral: Negative.       Allergies  Review of patient's allergies indicates no known allergies.  Home Medications   Prior to Admission medications   Medication Sig Start Date End Date Taking? Authorizing Provider  acetaminophen (TYLENOL) 500 MG tablet Take 500 mg by mouth every 6 (six) hours as needed.   Yes Historical Provider, MD  fluticasone (FLONASE) 50 MCG/ACT nasal spray Place 2 sprays into both nostrils daily.   Yes Historical Provider, MD  levothyroxine (SYNTHROID, LEVOTHROID) 112 MCG tablet Take 112 mcg by mouth daily before breakfast.   Yes Historical Provider, MD  insulin glargine (LANTUS) 100 UNIT/ML injection Inject 0.25 mLs (25 Units total) into the skin at bedtime. 01/25/16   Burgess AmorJulie Romolo Sieling, PA-C  insulin regular (NOVOLIN R,HUMULIN R) 100 units/mL injection Inject 0.1 mLs (10 Units total) into the skin 3 (three) times daily before meals. 01/25/16   Burgess AmorJulie Maxden Naji, PA-C   BP 123/79 mmHg  Pulse 77  Temp(Src) 97.9 F (36.6 C) (Oral)  Resp 18  Ht 5\' 11"  (1.803 m)  Wt 68.04 kg  BMI 20.93 kg/m2  SpO2 100% Physical Exam  Constitutional: He appears well-developed and well-nourished.  HENT:  Head: Normocephalic and atraumatic.  Mouth/Throat: Uvula is midline. Mucous membranes  are dry.  Eyes: Conjunctivae are normal.  Neck: Normal range of motion.  Cardiovascular: Normal rate, regular rhythm, normal heart sounds and intact distal pulses.   Pulmonary/Chest: Effort normal and breath sounds normal. He has no wheezes.  Abdominal: Soft. Bowel sounds are normal. He exhibits no distension. There is no tenderness. There is no guarding.  Musculoskeletal:  Normal range of motion.  Neurological: He is alert.  Skin: Skin is warm and dry.  Psychiatric: He has a normal mood and affect.  Nursing note and vitals reviewed.   ED Course  Procedures (including critical care time) Labs Review Labs Reviewed  URINALYSIS, ROUTINE W REFLEX MICROSCOPIC (NOT AT [facility]) - Abnormal; Notable for the following:    Glucose, UA >1000 (*)    Hgb urine dipstick LARGE (*)    All other components within normal limits  BASIC METABOLIC PANEL - Abnormal; Notable for the following:    Sodium 127 (*)    Chloride 93 (*)    Glucose, Bld 778 (*)    Creatinine, Ser 1.30 (*)    GFR calc non Af Amer 57 (*)    All other components within normal limits  URINE MICROSCOPIC-ADD ON - Abnormal; Notable for the following:    Bacteria, UA RARE (*)    All other components within normal limits  CBG MONITORING, ED - Abnormal; Notable for the following:    Glucose-Capillary >600 (*)    All other components within normal limits  CBG MONITORING, ED - Abnormal; Notable for the following:    Glucose-Capillary 408 (*)    All other components within normal limits  CBC WITH DIFFERENTIAL/PLATELET    Imaging Review No results found. I have personally reviewed and evaluated these images and lab results as part of my medical decision-making.   EKG Interpretation None      MDM   Final diagnoses:  Acute urinary retention  Hyperglycemia  H/O medication noncompliance    Patients labs reviewed.    Results were also discussed with patient.  He was given NS 2 liters along with IV regular insulin 12 units with significant improvement in cbg, no dka.  Corrected Na 137.  Pt was prescribed both his regular insulin and his lantus, advised recheck by his pcp and frequent cbg checks, also wrote script for syringes and needles.  He was given a leg bag, foley left in place.  He will need close f/u with urology. Advised recheck on Monday or Tuesday for further eval of the urinary retention.  No  uti today.  He is seen by the [facility]. Unsure if he can get appt that quickly. Referral given for [facility] for f/u in [place] early next week.  Pt agrees with and understands plan.  Discussed patient with Dr. Patria Mane prior to dc home.     Burgess Amor, PA-C 01/25/16 1753  Azalia Bilis, MD 01/26/16 1004

## 2016-01-26 LAB — BASIC METABOLIC PANEL
ANION GAP: 7 (ref 5–15)
BUN: 18 mg/dL (ref 6–20)
CALCIUM: 9 mg/dL (ref 8.9–10.3)
CO2: 26 mmol/L (ref 22–32)
CREATININE: 1.32 mg/dL — AB (ref 0.61–1.24)
Chloride: 94 mmol/L — ABNORMAL LOW (ref 101–111)
GFR, EST NON AFRICAN AMERICAN: 56 mL/min — AB (ref >60)
Glucose, Bld: 800 mg/dL (ref 65–99)
Potassium: 4.9 mmol/L (ref 3.5–5.1)
SODIUM: 127 mmol/L — AB (ref 135–145)

## 2016-01-26 LAB — CBG MONITORING, ED
GLUCOSE-CAPILLARY: 467 mg/dL — AB (ref 65–99)
Glucose-Capillary: 330 mg/dL — ABNORMAL HIGH (ref 65–99)

## 2016-01-26 MED ORDER — INSULIN ASPART 100 UNIT/ML ~~LOC~~ SOLN
5.0000 [IU] | Freq: Once | SUBCUTANEOUS | Status: AC
  Administered 2016-01-26: 5 [IU] via INTRAVENOUS
  Filled 2016-01-26: qty 1

## 2016-01-26 MED ORDER — INSULIN ASPART 100 UNIT/ML ~~LOC~~ SOLN
10.0000 [IU] | Freq: Once | SUBCUTANEOUS | Status: AC
  Administered 2016-01-26: 10 [IU] via INTRAVENOUS
  Filled 2016-01-26: qty 1

## 2016-01-26 MED ORDER — SODIUM CHLORIDE 0.9 % IV BOLUS (SEPSIS)
1000.0000 mL | Freq: Once | INTRAVENOUS | Status: AC
  Administered 2016-01-26: 1000 mL via INTRAVENOUS

## 2016-01-26 MED ORDER — INSULIN ASPART 100 UNIT/ML ~~LOC~~ SOLN
5.0000 [IU] | Freq: Once | SUBCUTANEOUS | Status: AC
  Administered 2016-01-26: 5 [IU] via SUBCUTANEOUS
  Filled 2016-01-26: qty 1

## 2016-01-26 NOTE — Discharge Instructions (Signed)
You need to get your prescriptions filled that you were given earlier today for your insulin and your needles. Follow-up with [facility] as discussed at your prior ED visit. Hyperglycemia Hyperglycemia occurs when the glucose (sugar) in your blood is too high. Hyperglycemia can happen for many reasons, but it most often happens to people who do not know they have diabetes or are not managing their diabetes properly.  CAUSES  Whether you have diabetes or not, there are other causes of hyperglycemia. Hyperglycemia can occur when you have diabetes, but it can also occur in other situations that you might not be as aware of, such as: Diabetes  If you have diabetes and are having problems controlling your blood glucose, hyperglycemia could occur because of some of the following reasons:  Not following your meal plan.  Not taking your diabetes medications or not taking it properly.  Exercising less or doing less activity than you normally do.  Being sick. Pre-diabetes  This cannot be ignored. Before people develop Type 2 diabetes, they almost always have "pre-diabetes." This is when your blood glucose levels are higher than normal, but not yet high enough to be diagnosed as diabetes. Research has shown that some long-term damage to the body, especially the heart and circulatory system, may already be occurring during pre-diabetes. If you take action to manage your blood glucose when you have pre-diabetes, you may delay or prevent Type 2 diabetes from developing. Stress  If you have diabetes, you may be "diet" controlled or on oral medications or insulin to control your diabetes. However, you may find that your blood glucose is higher than usual in the hospital whether you have diabetes or not. This is often referred to as "stress hyperglycemia." Stress can elevate your blood glucose. This happens because of hormones put out by the body during times of stress. If stress has been the cause of your  high blood glucose, it can be followed regularly by your caregiver. That way he/she can make sure your hyperglycemia does not continue to get worse or progress to diabetes. Steroids  Steroids are medications that act on the infection fighting system (immune system) to block inflammation or infection. One side effect can be a rise in blood glucose. Most people can produce enough extra insulin to allow for this rise, but for those who cannot, steroids make blood glucose levels go even higher. It is not unusual for steroid treatments to "uncover" diabetes that is developing. It is not always possible to determine if the hyperglycemia will go away after the steroids are stopped. A special blood test called an A1c is sometimes done to determine if your blood glucose was elevated before the steroids were started. SYMPTOMS  Thirsty.  Frequent urination.  Dry mouth.  Blurred vision.  Tired or fatigue.  Weakness.  Sleepy.  Tingling in feet or leg. DIAGNOSIS  Diagnosis is made by monitoring blood glucose in one or all of the following ways:  A1c test. This is a chemical found in your blood.  Fingerstick blood glucose monitoring.  Laboratory results. TREATMENT  First, knowing the cause of the hyperglycemia is important before the hyperglycemia can be treated. Treatment may include, but is not be limited to:  Education.  Change or adjustment in medications.  Change or adjustment in meal plan.  Treatment for an illness, infection, etc.  More frequent blood glucose monitoring.  Change in exercise plan.  Decreasing or stopping steroids.  Lifestyle changes. HOME CARE INSTRUCTIONS   Test your  blood glucose as directed.  Exercise regularly. Your caregiver will give you instructions about exercise. Pre-diabetes or diabetes which comes on with stress is helped by exercising.  Eat wholesome, balanced meals. Eat often and at regular, fixed times. Your caregiver or nutritionist will  give you a meal plan to guide your sugar intake.  Being at an ideal weight is important. If needed, losing as little as 10 to 15 pounds may help improve blood glucose levels. SEEK MEDICAL CARE IF:   You have questions about medicine, activity, or diet.  You continue to have symptoms (problems such as increased thirst, urination, or weight gain). SEEK IMMEDIATE MEDICAL CARE IF:   You are vomiting or have diarrhea.  Your breath smells fruity.  You are breathing faster or slower.  You are very sleepy or incoherent.  You have numbness, tingling, or pain in your feet or hands.  You have chest pain.  Your symptoms get worse even though you have been following your caregiver's orders.  If you have any other questions or concerns.   This information is not intended to replace advice given to you by your health care provider. Make sure you discuss any questions you have with your health care provider.   Document Released: 04/15/2001 Document Revised: 01/12/2012 Document Reviewed: 06/26/2015 Elsevier Interactive Patient Education [employer]

## 2016-01-26 NOTE — ED Notes (Signed)
Went to evaluate patient's catheter, leg bag was not attached to catheter it was attached to the end of the clear tubing from original foley.  Original tubing removed and new leg bag attached correctly.  New foley bag given to patient with detailed instructions on how to change it at home.  Foley anchored using elastic band foley holder. Instructions on how to empty both given to patient and information on the importance of keeping each bag lower than his bladder to decrease the risk of infection.  Patient verbalized understanding of all instructions given.

## 2016-01-26 NOTE — ED Provider Notes (Signed)
CSN: [id number]     Arrival date & time 01/25/16  2300 History   First MD Initiated Contact with Patient 01/26/16 0151   Chief Complaint  Patient presents with  . Catheter Leaking     (Consider location/radiation/quality/duration/timing/severity/associated sxs/prior Treatment) HPI patient Gordon Robinson reports he was seen earlier this morning in ED for urinary retention and had a Foley catheter inserted. He states immediately it started draining or leaking around his urethra. He states initially the urine was bloody but it is clear now. He denies any abdominal pain, nausea, vomiting, or fever. During the conversation we were discussing discharge and he brought up that he had not been able to find his insulin needles. He now reports he hasn't had his insulin in 2 months. He wanted to get some insulin prior to leaving the ED. Evidently when he was seen earlier this morning his blood sugar was 778. He was given IV fluids and regular insulin and his CBG improved to 408. He was given a prescription for his insulin however he has not gotten it filled.   PCP [facility]  Past Medical History  Diagnosis Date  . Diabetes mellitus    Past Surgical History  Procedure Laterality Date  . Liver biopsy     No family history on file. Social History  Substance Use Topics  . Smoking status: Current Every Day Smoker -- 0.50 packs/day    Types: Cigarettes  . Smokeless tobacco: None  . Alcohol Use: Yes     Comment: occassinally    Review of Systems  All other systems reviewed and are negative.     Allergies  Review of patient's allergies indicates no known allergies.  Home Medications   Prior to Admission medications   Medication Sig Start Date End Date Taking? Authorizing Provider  acetaminophen (TYLENOL) 500 MG tablet Take 500 mg by mouth every 6 (six) hours as needed.    Historical Provider, MD  fluticasone (FLONASE) 50 MCG/ACT nasal spray Place 2 sprays into both nostrils daily.    Historical Provider,  MD  insulin glargine (LANTUS) 100 UNIT/ML injection Inject 0.25 mLs (25 Units total) into the skin at bedtime. 01/25/16   Burgess Amor, PA-C  insulin regular (NOVOLIN R,HUMULIN R) 100 units/mL injection Inject 0.1 mLs (10 Units total) into the skin 3 (three) times daily before meals. 01/25/16   Burgess Amor, PA-C  levothyroxine (SYNTHROID, LEVOTHROID) 112 MCG tablet Take 112 mcg by mouth daily before breakfast.    Historical Provider, MD   BP 107/62 mmHg  Pulse 84  Temp(Src) 98 F (36.7 C) (Oral)  Resp 16  Ht  (1.803 m)  Wt 177 lb (80.287 kg)  BMI 24.70 kg/m2  SpO2 98%  Vital signs normal   Physical Exam  Constitutional: He is oriented to person, place, and time. He appears well-developed and well-nourished.  Non-toxic appearance. He does not appear ill. No distress.  HENT:  Head: Normocephalic and atraumatic.  Right Ear: External ear normal.  Left Ear: External ear normal.  Nose: Nose normal. No mucosal edema or rhinorrhea.  Mouth/Throat: Oropharynx is clear and moist and mucous membranes are normal. No dental abscesses or uvula swelling.  Eyes: Conjunctivae and EOM are normal. Pupils are equal, round, and reactive to light.  Neck: Normal range of motion and full passive range of motion without pain. Neck supple.  Cardiovascular: Normal rate, regular rhythm and normal heart sounds.  Exam reveals no gallop and no friction rub.   No murmur heard. Pulmonary/Chest: Effort normal  and breath sounds normal. No respiratory distress. He has no wheezes. He has no rhonchi. He has no rales. He exhibits no tenderness and no crepitus.  Abdominal: Soft. Normal appearance and bowel sounds are normal. He exhibits no distension. There is no tenderness. There is no rebound and no guarding.  Genitourinary:  Foley catheter in place, he has a leg bag with clear yellow urine in it.  Musculoskeletal: Normal range of motion. He exhibits no edema or tenderness.  Moves all extremities well.   Neurological:  He is alert and oriented to person, place, and time. He has normal strength. No cranial nerve deficit.  Skin: Skin is warm, dry and intact. No rash noted. No erythema. No pallor.  Psychiatric: He has a normal mood and affect. His speech is normal and behavior is normal. His mood appears not anxious.  Nursing note and vitals reviewed.   ED Course  Procedures (including critical care time)  Medications  insulin aspart (novoLOG) injection 5 Units (not administered)  sodium chloride 0.9 % bolus 1,000 mL (0 mLs Intravenous Stopped 01/26/16 0305)  insulin aspart (novoLOG) injection 10 Units (10 Units Intravenous Given 01/26/16 0305)  sodium chloride 0.9 % bolus 1,000 mL (0 mLs Intravenous Stopped 01/26/16 0354)  sodium chloride 0.9 % bolus 1,000 mL (0 mLs Intravenous Stopped 01/26/16 0435)  insulin aspart (novoLOG) injection 5 Units (5 Units Intravenous Given 01/26/16 0434)  ' Nursing staff had evaluated patient's Foley catheter which was not connected properly. After it was connected properly he had normal drainage into his leg bag. However when patient CBG was checked it was too high to read. Bmet shows that his glucose is 800 without metabolic acidosis. He does have a renal insufficiency. He was given IV fluids, he was given IV insulin twice. His last CBG was 330 and he was given subcutaneous insulin. Patient had been given a prescription for his insulin with his prior ED visit earlier today. He is encouraged to get that filled. He should follow through with urology as previously instructed. Nursing staff tonight gave him more instruction on how to take care of his Foley catheter.  Labs Review Results for orders placed or performed during the hospital encounter of 01/25/16  Basic metabolic panel  Result Value Ref Range   Sodium 127 (L) 135 - 145 mmol/L   Potassium 4.9 3.5 - 5.1 mmol/L   Chloride 94 (L) 101 - 111 mmol/L   CO2 26 22 - 32 mmol/L   Glucose, Bld 800 (HH) 65 - 99 mg/dL   BUN 18 6 - 20  mg/dL   Creatinine, Ser 1.61 (H) 0.61 - 1.24 mg/dL   Calcium 9.0 8.9 - 09.6 mg/dL   GFR calc non Af Amer 56 (L) >60 mL/min   GFR calc Af Amer >60 >60 mL/min   Anion gap 7 5 - 15  CBG monitoring, ED  Result Value Ref Range   Glucose-Capillary >600 (HH) 65 - 99 mg/dL   Comment 1 Document in Chart    Comment 2 Call MD NNP PA CNM   CBG monitoring, ED  Result Value Ref Range   Glucose-Capillary 467 (H) 65 - 99 mg/dL  CBG monitoring, ED  Result Value Ref Range   Glucose-Capillary 330 (H) 65 - 99 mg/dL    Laboratory interpretation all normal except for hyperglycemia, renal insufficiency    MDM   Final diagnoses:  Foley catheter problem, initial encounter (HCC)  Hyperglycemia    Plan discharge  Devoria Albe, MD, Armando Gang  CRITICAL CARE Performed by: Devoria AlbeKNAPP,Kofi Murrell L Total critical care time: 31 minutes Critical care time was exclusive of separately billable procedures and treating other patients. Critical care was necessary to treat or prevent imminent or life-threatening deterioration. Critical care was time spent personally by me on the following activities: development of treatment plan with patient and/or surrogate as well as nursing, discussions with consultants, evaluation of patient's response to treatment, examination of patient, obtaining history from patient or surrogate, ordering and performing treatments and interventions, ordering and review of laboratory studies, ordering and review of radiographic studies, pulse oximetry and re-evaluation of patient's condition.   Devoria AlbeIva Dajon Rowe, MD 01/26/16 [phone]

## 2016-01-26 NOTE — ED Notes (Signed)
CRITICAL VALUE ALERT  Critical value received:  Glucose 800  Date of notification:  01/26/16  Time of notification:  0301  Critical value read back:Yes.    Nurse who received alert:  Rudene AndaSusan Frye-Yarbrough, RN  Responding MD:  Dr. Devoria AlbeIva Knapp  Time MD responded:  [phone]

## 2017-10-29 ENCOUNTER — Encounter (HOSPITAL_COMMUNITY): Payer: Self-pay | Admitting: Emergency Medicine

## 2017-10-29 ENCOUNTER — Other Ambulatory Visit: Payer: Self-pay

## 2017-10-29 ENCOUNTER — Emergency Department (HOSPITAL_COMMUNITY)
Admission: EM | Admit: 2017-10-29 | Discharge: 2017-10-29 | Disposition: A | Discharge status: Home / Self Care | Payer: Medicare Other | Attending: Emergency Medicine | Admitting: Emergency Medicine

## 2017-10-29 DIAGNOSIS — S60862A Insect bite (nonvenomous) of left wrist, initial encounter: Secondary | ICD-10-CM | POA: Diagnosis present

## 2017-10-29 DIAGNOSIS — W57XXXA Bitten or stung by nonvenomous insect and other nonvenomous arthropods, initial encounter: Secondary | ICD-10-CM | POA: Insufficient documentation

## 2017-10-29 DIAGNOSIS — Z794 Long term (current) use of insulin: Secondary | ICD-10-CM | POA: Diagnosis not present

## 2017-10-29 DIAGNOSIS — S1086XA Insect bite of other specified part of neck, initial encounter: Secondary | ICD-10-CM | POA: Diagnosis not present

## 2017-10-29 DIAGNOSIS — Y939 Activity, unspecified: Secondary | ICD-10-CM | POA: Diagnosis not present

## 2017-10-29 DIAGNOSIS — E119 Type 2 diabetes mellitus without complications: Secondary | ICD-10-CM | POA: Diagnosis not present

## 2017-10-29 DIAGNOSIS — S60861A Insect bite (nonvenomous) of right wrist, initial encounter: Secondary | ICD-10-CM | POA: Diagnosis not present

## 2017-10-29 DIAGNOSIS — Y998 Other external cause status: Secondary | ICD-10-CM | POA: Diagnosis not present

## 2017-10-29 DIAGNOSIS — F17210 Nicotine dependence, cigarettes, uncomplicated: Secondary | ICD-10-CM | POA: Insufficient documentation

## 2017-10-29 DIAGNOSIS — Y929 Unspecified place or not applicable: Secondary | ICD-10-CM | POA: Insufficient documentation

## 2017-10-29 MED ORDER — DEXAMETHASONE 4 MG PO TABS: 4.0000 mg | ORAL_TABLET | Freq: Two times a day (BID) | ORAL | 0 refills | Status: DC

## 2017-10-29 MED ORDER — LIDOCAINE 5 % EX OINT
TOPICAL_OINTMENT | Freq: Once | CUTANEOUS | Status: AC
  Administered 2017-10-29: via TOPICAL
  Filled 2017-10-29: qty 35.44

## 2017-10-29 MED ORDER — LIDOCAINE 5 % EX OINT: 1.0000 "application " | TOPICAL_OINTMENT | CUTANEOUS | 0 refills | Status: DC | PRN

## 2017-10-29 MED ORDER — HYDROXYZINE PAMOATE 25 MG PO CAPS: 25.0000 mg | ORAL_CAPSULE | Freq: Four times a day (QID) | ORAL | 0 refills | Status: DC | PRN

## 2017-10-29 MED ORDER — LORATADINE 10 MG PO TABS
10.0000 mg | ORAL_TABLET | Freq: Once | ORAL | Status: AC
  Administered 2017-10-29: 10 mg via ORAL
  Filled 2017-10-29: qty 1

## 2017-10-29 MED ORDER — METHYLPREDNISOLONE SODIUM SUCC 125 MG IJ SOLR
125.0000 mg | Freq: Once | INTRAMUSCULAR | Status: AC
  Administered 2017-10-29: 125 mg via INTRAMUSCULAR
  Filled 2017-10-29: qty 2

## 2017-10-29 NOTE — Discharge Instructions (Signed)
Your examination is consistent with mites, or insect bites.  Please wash your linens and clothing daily to prevent any spread and to kill any eggs or larva. Please apply lidocaine ointment daily.  Please use Decadron 2 times daily with a meal.  Use Claritin for itching during the day.  Use Vistaril for itching at night.  This medication will cause drowsiness, please do not drive, operate machinery, drink alcohol, or participate in activities requiring concentration when taking this medication.

## 2017-10-29 NOTE — ED Triage Notes (Signed)
Pt has insect bites to the back of neck and bilateral hands since yesterday after a friend move.

## 2017-10-29 NOTE — ED Provider Notes (Signed)
[facility] EMERGENCY DEPARTMENT Provider Note   CSN: [id number] Arrival date & time: 10/29/17  2228     History   Chief Complaint Chief Complaint  Patient presents with  . Insect Bite    HPI Gordon Robinson is a 66 y.o. male.  Patient is a 66 year old male who presents to the emergency department with a complaint of insect bites.  The patient states he was helping a friend move furniture on yesterday.  He began to have itching about his wrist and then on his neck.  He states the problem is getting worse and worse.  At times he had difficulty resting last night because of the itching and the pain.  Patient states he did not see any flying insects are crawling insects.  He states that most of the other workers did not have any rash or other complaints.  Is been no fever or chills reported.  The patient presents now for assistance with this problem.      Past Medical History:  Diagnosis Date  . Diabetes mellitus     There are no active problems to display for this patient.   Past Surgical History:  Procedure Laterality Date  . LIVER BIOPSY         Home Medications    Prior to Admission medications   Medication Sig Start Date End Date Taking? Authorizing Provider  acetaminophen (TYLENOL) 500 MG tablet Take 500 mg by mouth every 6 (six) hours as needed.    [provider]  fluticasone (FLONASE) 50 MCG/ACT nasal spray Place 2 sprays into both nostrils daily.    [provider]  insulin glargine (LANTUS) 100 UNIT/ML injection Inject 0.25 mLs (25 Units total) into the skin at bedtime. 01/25/16   Burgess Amor, PA-C  insulin regular (NOVOLIN R,HUMULIN R) 100 units/mL injection Inject 0.1 mLs (10 Units total) into the skin 3 (three) times daily before meals. 01/25/16   Burgess Amor, PA-C  levothyroxine (SYNTHROID, LEVOTHROID) 112 MCG tablet Take 112 mcg by mouth daily before breakfast.    [provider]    Family History History reviewed. No pertinent  family history.  Social History Social History   Tobacco Use  . Smoking status: Current Every Day Smoker    Packs/day: 0.50    Types: Cigarettes  . Smokeless tobacco: Never Used  Substance Use Topics  . Alcohol use: Yes    Comment: occassinally  . Drug use: Yes    Types: Cocaine    Comment: a week ago     Allergies   Patient has no known allergies.   Review of Systems Review of Systems  Constitutional: Negative for activity change.       All ROS Neg except as noted in HPI  HENT: Negative for nosebleeds.   Eyes: Negative for photophobia and discharge.  Respiratory: Negative for cough, shortness of breath and wheezing.   Cardiovascular: Negative for chest pain and palpitations.  Gastrointestinal: Negative for abdominal pain and blood in stool.  Genitourinary: Negative for dysuria, frequency and hematuria.  Musculoskeletal: Negative for arthralgias, back pain and neck pain.  Skin: Positive for rash.       Insect bites  Neurological: Negative for dizziness, seizures and speech difficulty.  Psychiatric/Behavioral: Negative for confusion and hallucinations.     Physical Exam Updated Vital Signs BP (!) 151/96   Pulse (!) 105   Temp 98.4 F (36.9 C)   Resp 20   Ht 5\' 11"  (1.803 m)   Wt 78.5 kg (173  lb)   SpO2 100%   BMI 24.13 kg/m   Physical Exam  Constitutional: He is oriented to person, place, and time. He appears well-developed and well-nourished.  Non-toxic appearance.  HENT:  Head: Normocephalic.  Right Ear: Tympanic membrane and external ear normal.  Left Ear: Tympanic membrane and external ear normal.  Eyes: EOM and lids are normal. Pupils are equal, round, and reactive to light.  Neck: Normal range of motion. Neck supple. Carotid bruit is not present.  Cardiovascular: Regular rhythm, normal heart sounds, intact distal pulses and normal pulses. Tachycardia present.  Pulmonary/Chest: Breath sounds normal. No respiratory distress.  Abdominal: Soft. Bowel  sounds are normal. There is no tenderness. There is no guarding.  Musculoskeletal: Normal range of motion.  Lymphadenopathy:       Head (right side): No submandibular adenopathy present.       Head (left side): No submandibular adenopathy present.    He has no cervical adenopathy.  Neurological: He is alert and oriented to person, place, and time. He has normal strength. No cranial nerve deficit or sensory deficit.  Skin: Skin is warm and dry.  Red raised rash on the right and left wrist. Similar rash on the posterior neck. No other rash areas.  Psychiatric: He has a normal mood and affect. His speech is normal.  Nursing note and vitals reviewed.    ED Treatments / Results  Labs (all labs ordered are listed, but only abnormal results are displayed) Labs Reviewed - No data to display  EKG  EKG Interpretation None       Radiology No results found.  Procedures Procedures (including critical care time)  Medications Ordered in ED Medications  lidocaine (XYLOCAINE) 5 % ointment (not administered)  methylPREDNISolone sodium succinate (SOLU-MEDROL) 125 mg/2 mL injection 125 mg (125 mg Intramuscular Given 10/29/17 2315)  loratadine (CLARITIN) tablet 10 mg (10 mg Oral Given 10/29/17 2315)     Initial Impression / Assessment and Plan / ED Course  I have reviewed the triage vital signs and the nursing notes.  Pertinent labs & imaging results that were available during my care of the patient were reviewed by me and considered in my medical decision making (see chart for details).       Final Clinical Impressions(s) / ED Diagnoses Pt seen with me by Dr Deretha EmoryZackowski Patient states he was moving furniture, some of it had cloth covering and cushions.  He is having increasing itching of the back of the neck as well as the right and left wrist.  No lesions noted between the fingers.  He has no other itching areas.  Question if this may be related to mites, insect bites, or  irritation.  Patient will be treated with lidocaine ointment, short course of Decadron, Claritin in the morning, and Vistaril at bedtime if needed for itching.  The patient is to [facility] if not improving.  Patient is in agreement with this plan.   Final diagnoses:  Insect bite, initial encounter    ED Discharge Orders        Ordered    dexamethasone (DECADRON) 4 MG tablet  2 times daily with meals     10/29/17 2333    hydrOXYzine (VISTARIL) 25 MG capsule  Every 6 hours PRN     10/29/17 2333    lidocaine (XYLOCAINE) 5 % ointment  As needed     10/29/17 2333       Ivery QualeBryant, Shivank Pinedo,  PA-C 10/29/17 2357    Vanetta MuldersZackowski, Scott, MD 11/06/17 1524

## 2017-10-29 NOTE — ED Provider Notes (Signed)
Medical screening examination/treatment/procedure(s) were conducted as a shared visit with non-physician practitioner(s) and myself.  I personally evaluated the patient during the encounter.   EKG Interpretation None      Patient seen by me along with the physician assistant.  Patient presents with thinks are insect bites to the back of his neck and at the base of the neck.  And he thinks also on his hands since yesterday.  Patient has no systemic symptoms.  No fever no abdominal pain no headache no nausea no vomiting.  Not exactly sure what made it occur.   Appears to be like a folliculitis on the back part of the neck.  Certainly could be insect bites.  Certainly no abrasions.  No cellulitis of any extent.  Discussed with the physician assistant will treat symptomatically.  Patient nontoxic no acute distress.  Otherwise is alert oriented lungs are clear heart regular abdomen soft.   Vanetta MuldersZackowski, Gordon Skufca, MD 10/29/17 2340

## 2018-02-15 LAB — GLUCOSE, POCT (MANUAL RESULT ENTRY): POC GLUCOSE: 378 mg/dL — AB (ref 70–99)

## 2018-10-01 NOTE — Congregational Nurse Program (Signed)
Seen at the [facility]. [address] P [address]. Encouraged to take medications as prescribed, eat a heart healthy diet, exercise and increase fluid intake. Voiced understanding Jenene SlickerEmma Victorio Creeden RN, [facility], [phone]

## 2019-04-27 ENCOUNTER — Other Ambulatory Visit: Payer: Self-pay

## 2019-04-27 ENCOUNTER — Other Ambulatory Visit: Payer: Self-pay | Admitting: Internal Medicine

## 2019-04-27 DIAGNOSIS — Z20822 Contact with and (suspected) exposure to covid-19: Secondary | ICD-10-CM

## 2019-05-02 LAB — NOVEL CORONAVIRUS, NAA: SARS-CoV-2, NAA: NOT DETECTED

## 2019-11-08 ENCOUNTER — Other Ambulatory Visit: Payer: Medicare Other

## 2019-11-10 ENCOUNTER — Other Ambulatory Visit: Payer: Medicare Other

## 2019-11-11 ENCOUNTER — Other Ambulatory Visit: Payer: Medicare Other

## 2019-11-18 ENCOUNTER — Other Ambulatory Visit: Payer: Medicare Other

## 2020-01-31 ENCOUNTER — Ambulatory Visit: Payer: Medicare Other | Attending: Internal Medicine

## 2020-01-31 DIAGNOSIS — Z23 Encounter for immunization: Secondary | ICD-10-CM

## 2020-01-31 NOTE — Progress Notes (Signed)
   Covid-19 Vaccination Clinic  Name:  Gordon Robinson    MRN: [id number] DOB: 06-01-1951  01/31/2020  Gordon Robinson was observed post Covid-19 immunization for 15 minutes without incident. He was provided with Vaccine Information Sheet and instruction to access the V-Safe system.   Gordon Robinson was instructed to call 911 with any severe reactions post vaccine: Marland Kitchen Difficulty breathing  . Swelling of face and throat  . A fast heartbeat  . A bad rash all over body  . Dizziness and weakness   Immunizations Administered    Name Date Dose VIS Date Route   Moderna COVID-19 Vaccine 01/31/2020  9:20 AM 0.5 mL 10/04/2019 Intramuscular   Manufacturer: Moderna   Lot: [id number]   NDC: 54982-641-58

## 2020-03-10 ENCOUNTER — Ambulatory Visit: Payer: Medicare Other | Attending: Internal Medicine

## 2020-03-10 DIAGNOSIS — Z23 Encounter for immunization: Secondary | ICD-10-CM

## 2020-03-10 NOTE — Progress Notes (Signed)
   Covid-19 Vaccination Clinic  Name:  Gordon Robinson    MRN: [id number] DOB: [birth year]  03/10/2020  Mr. [name] was observed post Covid-19 immunization for 15 minutes without incident. He was provided with Vaccine Information Sheet and instruction to access the V-Safe system.   Mr. [name] was instructed to call 911 with any severe reactions post vaccine: Marland Kitchen Difficulty breathing  . Swelling of face and throat  . A fast heartbeat  . A bad rash all over body  . Dizziness and weakness   Immunizations Administered    Name Date Dose VIS Date Route   Moderna COVID-19 Vaccine 03/10/2020 10:11 AM 0.5 mL 10/2019 Intramuscular   Manufacturer: Moderna   Lot: [id number]   NDC: 48350-757-32

## 2020-05-23 ENCOUNTER — Emergency Department (HOSPITAL_COMMUNITY)
Admission: EM | Admit: 2020-05-23 | Discharge: 2020-05-23 | Disposition: A | Discharge status: Home / Self Care | Payer: Medicare Other | Attending: Emergency Medicine | Admitting: Emergency Medicine

## 2020-05-23 ENCOUNTER — Encounter (HOSPITAL_COMMUNITY): Payer: Self-pay | Admitting: Emergency Medicine

## 2020-05-23 ENCOUNTER — Other Ambulatory Visit: Payer: Self-pay

## 2020-05-23 DIAGNOSIS — E1165 Type 2 diabetes mellitus with hyperglycemia: Secondary | ICD-10-CM | POA: Insufficient documentation

## 2020-05-23 DIAGNOSIS — Z794 Long term (current) use of insulin: Secondary | ICD-10-CM | POA: Insufficient documentation

## 2020-05-23 DIAGNOSIS — F17210 Nicotine dependence, cigarettes, uncomplicated: Secondary | ICD-10-CM | POA: Diagnosis not present

## 2020-05-23 DIAGNOSIS — R350 Frequency of micturition: Secondary | ICD-10-CM | POA: Diagnosis present

## 2020-05-23 LAB — CBC
HCT: 43.1 % (ref 39.0–52.0)
Hemoglobin: 14.3 g/dL (ref 13.0–17.0)
MCH: 29.5 pg (ref 26.0–34.0)
MCHC: 33.2 g/dL (ref 30.0–36.0)
MCV: 89 fL (ref 80.0–100.0)
Platelets: 219 K/uL (ref 150–400)
RBC: 4.84 MIL/uL (ref 4.22–5.81)
RDW: 12.3 % (ref 11.5–15.5)
WBC: 6.7 K/uL (ref 4.0–10.5)
nRBC: 0 % (ref 0.0–0.2)

## 2020-05-23 LAB — BASIC METABOLIC PANEL WITH GFR
Anion gap: 11 (ref 5–15)
BUN: 23 mg/dL (ref 8–23)
CO2: 26 mmol/L (ref 22–32)
Calcium: 9.3 mg/dL (ref 8.9–10.3)
Chloride: 94 mmol/L — ABNORMAL LOW (ref 98–111)
Creatinine, Ser: 1.11 mg/dL (ref 0.61–1.24)
GFR calc Af Amer: >60 mL/min (ref >60)
GFR calc non Af Amer: >60 mL/min (ref >60)
Glucose, Bld: 532 mg/dL (ref 70–99)
Potassium: 4.4 mmol/L (ref 3.5–5.1)
Sodium: 131 mmol/L — ABNORMAL LOW (ref 135–145)

## 2020-05-23 LAB — CBG MONITORING, ED
Glucose-Capillary: 557 mg/dL (ref 70–99)
Glucose-Capillary: 242 mg/dL — ABNORMAL HIGH (ref 70–99)

## 2020-05-23 MED ORDER — INSULIN REGULAR HUMAN 100 UNIT/ML IJ SOLN: 10.0000 [IU] | Freq: Three times a day (TID) | INTRAMUSCULAR | 0 refills | Status: DC

## 2020-05-23 MED ORDER — INSULIN ASPART 100 UNIT/ML IV SOLN
10.0000 [IU] | Freq: Once | INTRAVENOUS | Status: AC
  Administered 2020-05-23: 10 [IU] via INTRAVENOUS

## 2020-05-23 MED ORDER — SODIUM CHLORIDE 0.9 % IV BOLUS
1000.0000 mL | Freq: Once | INTRAVENOUS | Status: AC
  Administered 2020-05-23: 1000 mL via INTRAVENOUS

## 2020-05-23 MED ORDER — NYSTATIN 100000 UNIT/GM EX CREA: TOPICAL_CREAM | Freq: Two times a day (BID) | CUTANEOUS | Status: DC

## 2020-05-23 MED ORDER — NYSTATIN 100000 UNIT/GM EX CREA: TOPICAL_CREAM | CUTANEOUS | 0 refills | Status: DC

## 2020-05-23 NOTE — ED Triage Notes (Signed)
Patient states urinary frequency x 2 weeks with no pain urinating or any other complaints.

## 2020-05-23 NOTE — Discharge Instructions (Signed)
Your testing has shown that your sugar is elevated but it is come down significantly with medications and IV fluids.  I would encourage you to please take your insulin exactly as prescribed by your doctor, you may return to the hospital for any severe or worsening symptoms or if you have any other concerns. Please see your doctor in the office in the next week for recheck

## 2020-05-23 NOTE — ED Notes (Signed)
Pt standing in room, pt has some dry rash around the head of his penis, pt c/o urinary frequency, denies pain or drainage.

## 2020-05-23 NOTE — ED Provider Notes (Addendum)
[facility] EMERGENCY DEPARTMENT Provider Note   CSN: [id number] Arrival date & time: 05/23/20  7829     History Chief Complaint  Patient presents with  . urinary frequency    hyperglycemia    Gordon Robinson is a 69 y.o. male.  HPI   This patient is a 69 year old male with a known history of diabetes, he presents to the hospital with increased urinary frequency, increased thirst, progressive mild weakness and lightheadedness with standing.  He reports that sometimes he just does not want to take his medication, he gets tired of taking it, he runs out of places to inject in his abdomen.  He still has his medication at home, he knows that he should take it but does not sometimes.  Because of his increasing symptoms he feared that his blood sugar was high and states that it has been as high as 900 in the past.  He denies chest pain shortness of breath or palpitations.  No nausea vomiting or diarrhea.  No fevers or chills.  Past Medical History:  Diagnosis Date  . Diabetes mellitus     There are no problems to display for this patient.   Past Surgical History:  Procedure Laterality Date  . LIVER BIOPSY         History reviewed. No pertinent family history.  Social History   Tobacco Use  . Smoking status: Current Every Day Smoker    Packs/day: 0.50    Types: Cigarettes  . Smokeless tobacco: Never Used  Substance Use Topics  . Alcohol use: Yes    Comment: occassinally  . Drug use: Yes    Types: Cocaine    Comment: a week ago    Home Medications Prior to Admission medications   Medication Sig Start Date End Date Taking? Authorizing Provider  acetaminophen (TYLENOL) 500 MG tablet Take 500 mg by mouth every 6 (six) hours as needed.    [provider]  dexamethasone (DECADRON) 4 MG tablet Take 1 tablet (4 mg total) by mouth 2 (two) times daily with a meal. 10/29/17   Ivery Quale, PA-C  fluticasone (FLONASE) 50 MCG/ACT nasal spray Place 2 sprays into both  nostrils daily.    [provider]  hydrOXYzine (VISTARIL) 25 MG capsule Take 1 capsule (25 mg total) by mouth every 6 (six) hours as needed. Use for itching and rash 10/29/17   Ivery Quale, PA-C  insulin glargine (LANTUS) 100 UNIT/ML injection Inject 0.25 mLs (25 Units total) into the skin at bedtime. 01/25/16   Burgess Amor, PA-C  insulin regular (NOVOLIN R) 100 units/mL injection Inject 0.1 mLs (10 Units total) into the skin 3 (three) times daily before meals. 05/23/20   Eber Hong, MD  levothyroxine (SYNTHROID, LEVOTHROID) 112 MCG tablet Take 112 mcg by mouth daily before breakfast.    [provider]  lidocaine (XYLOCAINE) 5 % ointment Apply 1 application topically as needed. Use for pain and itching of rash 10/29/17   Ivery Quale, PA-C    Allergies    Patient has no known allergies.  Review of Systems   Review of Systems  All other systems reviewed and are negative.   Physical Exam Updated Vital Signs BP 137/76   Pulse 76   Temp 98.9 F (37.2 C) (Oral)   Resp 18   Ht 1.803 m (5\' 11" )   Wt 71.1 kg   SpO2 100%   BMI 21.86 kg/m   Physical Exam Vitals and nursing note reviewed.  Constitutional:  General: He is not in acute distress.    Appearance: He is well-developed.  HENT:     Head: Normocephalic and atraumatic.     Mouth/Throat:     Pharynx: No oropharyngeal exudate.  Eyes:     General: No scleral icterus.       Right eye: No discharge.        Left eye: No discharge.     Conjunctiva/sclera: Conjunctivae normal.     Pupils: Pupils are equal, round, and reactive to light.  Neck:     Thyroid: No thyromegaly.     Vascular: No JVD.  Cardiovascular:     Rate and Rhythm: Normal rate and regular rhythm.     Heart sounds: Normal heart sounds. No murmur heard.  No friction rub. No gallop.   Pulmonary:     Effort: Pulmonary effort is normal. No respiratory distress.     Breath sounds: Normal breath sounds. No wheezing or rales.  Abdominal:       General: Bowel sounds are normal. There is no distension.     Palpations: Abdomen is soft. There is no mass.     Tenderness: There is no abdominal tenderness.  Musculoskeletal:        General: No tenderness. Normal range of motion.     Cervical back: Normal range of motion and neck supple.  Lymphadenopathy:     Cervical: No cervical adenopathy.  Skin:    General: Skin is warm and dry.     Findings: No erythema or rash.  Neurological:     Mental Status: He is alert.     Coordination: Coordination normal.  Psychiatric:        Behavior: Behavior normal.     ED Results / Procedures / Treatments   Labs (all labs ordered are listed, but only abnormal results are displayed) Labs Reviewed  BASIC METABOLIC PANEL - Abnormal; Notable for the following components:      Result Value   Sodium 131 (*)    Chloride 94 (*)    Glucose, Bld 532 (*)    All other components within normal limits  CBG MONITORING, ED - Abnormal; Notable for the following components:   Glucose-Capillary 557 (*)    All other components within normal limits  CBG MONITORING, ED - Abnormal; Notable for the following components:   Glucose-Capillary 242 (*)    All other components within normal limits  CBC    EKG None  Radiology No results found.  Procedures Procedures (including critical care time)  Medications Ordered in ED Medications  sodium chloride 0.9 % bolus 1,000 mL (0 mLs Intravenous Stopped 05/23/20 2226)  insulin aspart (novoLOG) injection 10 Units (10 Units Intravenous Given 05/23/20 2105)  sodium chloride 0.9 % bolus 1,000 mL (0 mLs Intravenous Stopped 05/23/20 2226)    ED Course  I have reviewed the triage vital signs and the nursing notes.  Pertinent labs & imaging results that were available during my care of the patient were reviewed by me and considered in my medical decision making (see chart for details).    MDM Rules/Calculators/A&P                          The patient's exam is  rather unremarkable given his glucose of 557.  His metabolic panel is pending at this time, CBC is unremarkable and his vital signs reflect mild hypertension but no tachycardia or fever or hypoxia.  At this time the patient  will have lab work to further clarify the consequences of his hyperglycemia such as DKA however he appears well, will go ahead with IV hydration and insulin  The patient sugar has improved significantly down to 242, vital signs remain normal, he is still well-appearing, I have given him a refill on his medication as he states he was getting a little bit low on the short acting insulin.  Agreeable for discharge, no other abnormal findings on laboratory work-up that would require further inpatient evaluation, no signs of DKA  Final Clinical Impression(s) / ED Diagnoses Final diagnoses:  Hyperglycemia due to diabetes mellitus (HCC)    Rx / DC Orders ED Discharge Orders         Ordered    insulin regular (NOVOLIN R) 100 units/mL injection  3 times daily before meals     Discontinue  Reprint     05/23/20 2318           Eber Hong, MD 05/23/20 5364    Eber Hong, MD 05/23/20 2344

## 2020-06-21 ENCOUNTER — Ambulatory Visit (INDEPENDENT_AMBULATORY_CARE_PROVIDER_SITE_OTHER): Payer: Medicare Other | Admitting: Nurse Practitioner

## 2021-02-21 ENCOUNTER — Other Ambulatory Visit (HOSPITAL_COMMUNITY): Payer: Self-pay | Admitting: Internal Medicine

## 2021-02-21 DIAGNOSIS — F17200 Nicotine dependence, unspecified, uncomplicated: Secondary | ICD-10-CM

## 2021-02-22 ENCOUNTER — Other Ambulatory Visit (HOSPITAL_COMMUNITY): Payer: Self-pay | Admitting: Internal Medicine

## 2021-02-22 DIAGNOSIS — F17200 Nicotine dependence, unspecified, uncomplicated: Secondary | ICD-10-CM

## 2021-03-22 ENCOUNTER — Ambulatory Visit (HOSPITAL_COMMUNITY): Admission: RE | Admit: 2021-03-22 | Payer: Medicare Other | Source: Ambulatory Visit

## 2021-03-22 ENCOUNTER — Encounter (HOSPITAL_COMMUNITY): Payer: Self-pay

## 2021-09-17 ENCOUNTER — Other Ambulatory Visit (HOSPITAL_COMMUNITY): Payer: Self-pay | Admitting: Family Medicine

## 2021-09-17 DIAGNOSIS — R918 Other nonspecific abnormal finding of lung field: Secondary | ICD-10-CM

## 2021-09-17 DIAGNOSIS — F17200 Nicotine dependence, unspecified, uncomplicated: Secondary | ICD-10-CM

## 2021-10-10 ENCOUNTER — Encounter (HOSPITAL_COMMUNITY): Admission: RE | Admit: 2021-10-10 | Payer: Medicare Other | Source: Ambulatory Visit

## 2021-10-17 ENCOUNTER — Other Ambulatory Visit (HOSPITAL_COMMUNITY): Payer: Medicare Other

## 2021-10-24 ENCOUNTER — Encounter (HOSPITAL_COMMUNITY): Admission: RE | Admit: 2021-10-24 | Payer: Medicare Other | Source: Ambulatory Visit

## 2021-10-24 ENCOUNTER — Encounter (HOSPITAL_COMMUNITY): Payer: Self-pay

## 2021-11-07 ENCOUNTER — Other Ambulatory Visit: Payer: Self-pay

## 2021-11-07 ENCOUNTER — Ambulatory Visit (HOSPITAL_COMMUNITY)
Admission: RE | Admit: 2021-11-07 | Discharge: 2021-11-07 | Disposition: A | Discharge status: Home / Self Care | Payer: Medicare Other | Source: Ambulatory Visit | Attending: Family Medicine | Admitting: Family Medicine

## 2021-11-07 DIAGNOSIS — F17200 Nicotine dependence, unspecified, uncomplicated: Secondary | ICD-10-CM | POA: Diagnosis present

## 2021-11-07 DIAGNOSIS — R918 Other nonspecific abnormal finding of lung field: Secondary | ICD-10-CM | POA: Insufficient documentation

## 2021-11-07 MED ORDER — FLUDEOXYGLUCOSE F - 18 (FDG) INJECTION
7.9400 | Freq: Once | INTRAVENOUS | Status: AC | PRN
  Administered 2021-11-07: 7.94 via INTRAVENOUS

## 2022-02-13 ENCOUNTER — Other Ambulatory Visit (HOSPITAL_COMMUNITY): Payer: Self-pay | Admitting: Family Medicine

## 2022-02-13 DIAGNOSIS — M899 Disorder of bone, unspecified: Secondary | ICD-10-CM

## 2022-02-13 DIAGNOSIS — R918 Other nonspecific abnormal finding of lung field: Secondary | ICD-10-CM

## 2022-02-13 DIAGNOSIS — R948 Abnormal results of function studies of other organs and systems: Secondary | ICD-10-CM

## 2022-03-09 ENCOUNTER — Other Ambulatory Visit: Payer: Self-pay

## 2022-03-09 ENCOUNTER — Encounter (HOSPITAL_COMMUNITY): Payer: Self-pay | Admitting: Emergency Medicine

## 2022-03-09 ENCOUNTER — Emergency Department (HOSPITAL_COMMUNITY)
Admission: EM | Admit: 2022-03-09 | Discharge: 2022-03-09 | Disposition: A | Discharge status: Home / Self Care | Payer: Medicare Other | Attending: Emergency Medicine | Admitting: Emergency Medicine

## 2022-03-09 DIAGNOSIS — R197 Diarrhea, unspecified: Secondary | ICD-10-CM | POA: Insufficient documentation

## 2022-03-09 DIAGNOSIS — E119 Type 2 diabetes mellitus without complications: Secondary | ICD-10-CM | POA: Diagnosis not present

## 2022-03-09 DIAGNOSIS — Z794 Long term (current) use of insulin: Secondary | ICD-10-CM | POA: Diagnosis not present

## 2022-03-09 DIAGNOSIS — R61 Generalized hyperhidrosis: Secondary | ICD-10-CM | POA: Diagnosis not present

## 2022-03-09 DIAGNOSIS — R6883 Chills (without fever): Secondary | ICD-10-CM | POA: Insufficient documentation

## 2022-03-09 DIAGNOSIS — D72829 Elevated white blood cell count, unspecified: Secondary | ICD-10-CM | POA: Diagnosis not present

## 2022-03-09 DIAGNOSIS — R112 Nausea with vomiting, unspecified: Secondary | ICD-10-CM | POA: Diagnosis not present

## 2022-03-09 DIAGNOSIS — Z79899 Other long term (current) drug therapy: Secondary | ICD-10-CM | POA: Diagnosis not present

## 2022-03-09 LAB — LIPASE, BLOOD: Lipase: 25 U/L (ref 11–51)

## 2022-03-09 LAB — CBC WITH DIFFERENTIAL/PLATELET
Abs Immature Granulocytes: 0.05 10*3/uL (ref 0.00–0.07)
Basophils Absolute: 0 10*3/uL (ref 0.0–0.1)
Basophils Relative: 0 %
Eosinophils Absolute: 0.1 10*3/uL (ref 0.0–0.5)
Eosinophils Relative: 0 %
HCT: 48.5 % (ref 39.0–52.0)
Hemoglobin: 15.9 g/dL (ref 13.0–17.0)
Immature Granulocytes: 0 %
Lymphocytes Relative: 3 %
Lymphs Abs: 0.4 10*3/uL — ABNORMAL LOW (ref 0.7–4.0)
MCH: 29.6 pg (ref 26.0–34.0)
MCHC: 32.8 g/dL (ref 30.0–36.0)
MCV: 90.1 fL (ref 80.0–100.0)
Monocytes Absolute: 0.7 10*3/uL (ref 0.1–1.0)
Monocytes Relative: 5 %
Neutro Abs: 12.3 10*3/uL — ABNORMAL HIGH (ref 1.7–7.7)
Neutrophils Relative %: 92 %
Platelets: 199 10*3/uL (ref 150–400)
RBC: 5.38 MIL/uL (ref 4.22–5.81)
RDW: 12.1 % (ref 11.5–15.5)
WBC: 13.5 10*3/uL — ABNORMAL HIGH (ref 4.0–10.5)
nRBC: 0 % (ref 0.0–0.2)

## 2022-03-09 LAB — COMPREHENSIVE METABOLIC PANEL
ALT: 15 U/L (ref 0–44)
AST: 16 U/L (ref 15–41)
Albumin: 3.3 g/dL — ABNORMAL LOW (ref 3.5–5.0)
Alkaline Phosphatase: 51 U/L (ref 38–126)
Anion gap: 6 (ref 5–15)
BUN: 12 mg/dL (ref 8–23)
CO2: 26 mmol/L (ref 22–32)
Calcium: 8.8 mg/dL — ABNORMAL LOW (ref 8.9–10.3)
Chloride: 104 mmol/L (ref 98–111)
Creatinine, Ser: 1.14 mg/dL (ref 0.61–1.24)
GFR, Estimated: >60 mL/min (ref >60)
Glucose, Bld: 193 mg/dL — ABNORMAL HIGH (ref 70–99)
Potassium: 4.9 mmol/L (ref 3.5–5.1)
Sodium: 136 mmol/L (ref 135–145)
Total Bilirubin: 0.5 mg/dL (ref 0.3–1.2)
Total Protein: 7.7 g/dL (ref 6.5–8.1)

## 2022-03-09 LAB — URINALYSIS, ROUTINE W REFLEX MICROSCOPIC
Bilirubin Urine: NEGATIVE
Glucose, UA: 50 mg/dL — AB
Hgb urine dipstick: NEGATIVE
Ketones, ur: NEGATIVE mg/dL
Leukocytes,Ua: NEGATIVE
Nitrite: NEGATIVE
Protein, ur: NEGATIVE mg/dL
Specific Gravity, Urine: 1.017 (ref 1.005–1.030)
pH: 5 (ref 5.0–8.0)

## 2022-03-09 MED ORDER — SODIUM CHLORIDE 0.9 % IV BOLUS
500.0000 mL | Freq: Once | INTRAVENOUS | Status: AC
  Administered 2022-03-09: 500 mL via INTRAVENOUS

## 2022-03-09 MED ORDER — ONDANSETRON HCL 4 MG PO TABS: 4.0000 mg | ORAL_TABLET | Freq: Four times a day (QID) | ORAL | 0 refills | Status: DC

## 2022-03-09 MED ORDER — ONDANSETRON HCL 4 MG/2ML IJ SOLN
4.0000 mg | Freq: Once | INTRAMUSCULAR | Status: AC
  Administered 2022-03-09: 4 mg via INTRAVENOUS
  Filled 2022-03-09: qty 2

## 2022-03-09 NOTE — ED Triage Notes (Signed)
Patient c/o nausea, vomiting, and diarrhea that started suddenly this morning. Denies any fevers but reports "sweating." Patient states family member has had same symptoms x3 days. Denies any blood in emesis or stools. Denies any abd pain or urinary symptoms.  ?

## 2022-03-09 NOTE — ED Provider Notes (Signed)
?[facility] EMERGENCY DEPARTMENT ?Provider Note ? ? ?CSN: [id number] ?Arrival date & time: 03/09/22  1157 ? ?  ? ?History ? ?Chief Complaint  ?Patient presents with  ? Vomiting  ? ? ?Gordon Robinson is a 71 y.o. male. ? ?HPI ? ?  ? ?Gordon Robinson is a 71 y.o. male with past medical history of type 2 diabetes who presents to the Emergency Department complaining of nausea vomiting and diarrhea with onset at 4 AM this morning.  He reports multiple episodes of vomiting and loose watery stools.  Denies any black or bloody stools or blood in his vomitus.  States he had abdominal pain during his vomiting episodes that has since resolved.  States family members had similar symptoms 3 days ago.  No reported fever, but had "sweating and chills this morning."  He denies chest pain, shortness of breath, flank pain or urinary symptoms. ? ? ?Home Medications ?Prior to Admission medications   ?Medication Sig Start Date End Date Taking? Authorizing Provider  ?acetaminophen (TYLENOL) 500 MG tablet Take 500 mg by mouth every 6 (six) hours as needed.    [provider]  ?dexamethasone (DECADRON) 4 MG tablet Take 1 tablet (4 mg total) by mouth 2 (two) times daily with a meal. 10/29/17   Ivery Quale, PA-C  ?fluticasone (FLONASE) 50 MCG/ACT nasal spray Place 2 sprays into both nostrils daily.    [provider]  ?hydrOXYzine (VISTARIL) 25 MG capsule Take 1 capsule (25 mg total) by mouth every 6 (six) hours as needed. Use for itching and rash 10/29/17   Ivery Quale, PA-C  ?insulin glargine (LANTUS) 100 UNIT/ML injection Inject 0.25 mLs (25 Units total) into the skin at bedtime. 01/25/16   Burgess Amor, PA-C  ?insulin regular (NOVOLIN R) 100 units/mL injection Inject 0.1 mLs (10 Units total) into the skin 3 (three) times daily before meals. 05/23/20   Eber Hong, MD  ?levothyroxine (SYNTHROID, LEVOTHROID) 112 MCG tablet Take 112 mcg by mouth daily before breakfast.    [provider]  ?lidocaine (XYLOCAINE) 5 %  ointment Apply 1 application topically as needed. Use for pain and itching of rash 10/29/17   Ivery Quale, PA-C  ?nystatin cream (MYCOSTATIN) Apply to affected area 2 times daily 05/23/20   Eber Hong, MD  ?   ? ?Allergies    ?Patient has no known allergies.   ? ?Review of Systems   ?Review of Systems  ?Constitutional:  Positive for chills. Negative for fever.  ?Respiratory:  Negative for chest tightness and shortness of breath.   ?Cardiovascular:  Negative for chest pain.  ?Gastrointestinal:  Positive for diarrhea, nausea and vomiting. Negative for abdominal pain.  ?Genitourinary:  Negative for decreased urine volume and dysuria.  ?Musculoskeletal:  Negative for back pain, myalgias, neck pain and neck stiffness.  ?Skin:  Negative for rash.  ?Neurological:  Negative for dizziness, weakness, numbness and headaches.  ? ?Physical Exam ?Updated Vital Signs ?BP 126/87 (BP Location: Right Arm)   Pulse 99   Temp 98.8 ?F (37.1 ?C) (Oral)   Resp 16   Ht 5\' 11"  (1.803 m)   Wt 71.6 kg   SpO2 99%   BMI 22.02 kg/m?  ?Physical Exam ?Vitals and nursing note reviewed.  ?Constitutional:   ?   General: He is not in acute distress. ?   Appearance: Normal appearance. He is not ill-appearing or toxic-appearing.  ?HENT:  ?   Mouth/Throat:  ?   Mouth: Mucous membranes are dry.  ?   Comments:  Mucous membranes slightly dry.  No erythema or edema of the oropharynx.  Uvula midline nonedematous ?Cardiovascular:  ?   Rate and Rhythm: Normal rate and regular rhythm.  ?   Pulses: Normal pulses.  ?Pulmonary:  ?   Effort: Pulmonary effort is normal.  ?Chest:  ?   Chest wall: No tenderness.  ?Abdominal:  ?   General: There is no distension.  ?   Palpations: Abdomen is soft.  ?   Tenderness: There is no abdominal tenderness. There is no right CVA tenderness or left CVA tenderness.  ?Musculoskeletal:  ?   Right lower leg: No edema.  ?   Left lower leg: No edema.  ?Skin: ?   General: Skin is warm.  ?   Findings: No rash.  ?Neurological:  ?    General: No focal deficit present.  ?   Mental Status: He is alert.  ?   Sensory: No sensory deficit.  ?   Motor: No weakness.  ? ? ?ED Results / Procedures / Treatments   ?Labs ?(all labs ordered are listed, but only abnormal results are displayed) ?Labs Reviewed  ?CBC WITH DIFFERENTIAL/PLATELET - Abnormal; Notable for the following components:  ?    Result Value  ? WBC 13.5 (*)   ? Neutro Abs 12.3 (*)   ? Lymphs Abs 0.4 (*)   ? All other components within normal limits  ?COMPREHENSIVE METABOLIC PANEL - Abnormal; Notable for the following components:  ? Glucose, Bld 193 (*)   ? Calcium 8.8 (*)   ? Albumin 3.3 (*)   ? All other components within normal limits  ?LIPASE, BLOOD  ? ? ?EKG ?None ? ?Radiology ?No results found. ? ?Procedures ?Procedures  ? ? ?Medications Ordered in ED ?Medications  ?ondansetron (ZOFRAN) injection 4 mg (has no administration in time range)  ?sodium chloride 0.9 % bolus 500 mL (has no administration in time range)  ? ? ?ED Course/ Medical Decision Making/ A&P ?  ?                        ?Medical Decision Making ?Patient here for evaluation of nausea vomiting and diarrhea.  Sudden onset earlier this morning.  Reports multiple episodes of vomiting and diarrhea without evidence of bloody or black stools.  No hematemesis.  Abdominal pain at onset but denies any pain at this time.  Family members had similar symptoms 3 days ago. ? ?On exam, patient nontoxic-appearing.  No abdominal tenderness on my exam.  Mucous membranes are slightly dry.  He has a history of diabetes.  Will obtain labs to check chemistries, administer IV fluids I suspect this is a viral process.  If symptoms are improving he will likely be discharged home.  Clinically I have a low suspicion of acute abdomen. ? ?Amount and/or Complexity of Data Reviewed ?Labs: ordered. ?   Details: Labs interpreted by me, mild t leukocytosis with white count of 13,500.  Lipase unremarkable, chemistries show blood glucose of 193.  Kidney  function unremarkable.  No elevated LFTs.  Urinalysis unremarkable ?Discussion of management or test interpretation with external provider(s): Patient with symptoms of nausea vomiting and diarrhea and family member recently with similar symptoms.  He has had a benign abdominal exam.  He has denied any dysuria symptoms.  He has a slightly elevated white count, but no fever or obvious source of infection.  I suspect this is related to his recent vomiting, no significant electrolyte derangement.  Urinalysis unremarkable. ? ?  Patient appears appropriate for discharge home, will provide prescription for antiemetic and he is agreeable to close outpatient follow-up.  Return precautions were discussed. ? ?Risk ?Prescription drug management. ? ? ?On recheck, patient has received IV fluids and antiemetic and reports feeling better and states he is ready for discharge home.   ?Repeat exam of the abdomen, remains soft and nontender.  We will try oral fluid challenge. ? ?On reexam, patient has tolerated oral fluids without difficulty.  States his nausea and vomiting have resolved.  He is ambulated to the restroom without difficulty.  He is wanting to go home.  I feel that this is appropriate.  We will treat with antiemetic he is agreeable to close outpatient follow-up if needed. ? ? ? ? ? ? ? ? ?Final Clinical Impression(s) / ED Diagnoses ?Final diagnoses:  ?Nausea vomiting and diarrhea  ? ? ?Rx / DC Orders ?ED Discharge Orders   ? ? None  ? ?  ? ? ?  ?Pauline Ausriplett, Brailen Macneal, PA-C ?03/09/22 2314 ? ?  ?Derwood KaplanNanavati, Ankit, MD ?03/10/22 1101 ? ?

## 2022-03-09 NOTE — Discharge Instructions (Signed)
Frequent small sips of fluids and small amounts of food for this evening.  Bland diet as tolerated tomorrow.  Follow-up with your primary care provider for recheck.  Return emergency department for any new or worsening symptoms. ?

## 2022-03-25 ENCOUNTER — Ambulatory Visit (HOSPITAL_COMMUNITY): Admission: RE | Admit: 2022-03-25 | Payer: Medicare Other | Source: Ambulatory Visit

## 2022-03-25 ENCOUNTER — Ambulatory Visit (HOSPITAL_COMMUNITY): Payer: Medicare Other

## 2022-03-25 ENCOUNTER — Encounter (HOSPITAL_COMMUNITY): Payer: Self-pay

## 2022-03-27 ENCOUNTER — Encounter (HOSPITAL_COMMUNITY): Admission: RE | Admit: 2022-03-27 | Payer: Medicare Other | Source: Ambulatory Visit

## 2022-03-27 ENCOUNTER — Encounter (HOSPITAL_COMMUNITY): Payer: Medicare Other

## 2022-04-08 ENCOUNTER — Encounter (HOSPITAL_COMMUNITY): Payer: Medicare Other

## 2022-05-24 ENCOUNTER — Emergency Department (HOSPITAL_COMMUNITY): Payer: Medicare Other

## 2022-05-24 ENCOUNTER — Other Ambulatory Visit: Payer: Self-pay

## 2022-05-24 ENCOUNTER — Observation Stay (HOSPITAL_COMMUNITY)
Admission: EM | Admit: 2022-05-24 | Discharge: 2022-05-25 | Disposition: A | Discharge status: Home / Self Care | Payer: Medicare Other | Attending: Emergency Medicine | Admitting: Emergency Medicine

## 2022-05-24 ENCOUNTER — Encounter (HOSPITAL_COMMUNITY): Payer: Self-pay | Admitting: Emergency Medicine

## 2022-05-24 DIAGNOSIS — S2249XA Multiple fractures of ribs, unspecified side, initial encounter for closed fracture: Secondary | ICD-10-CM | POA: Diagnosis present

## 2022-05-24 DIAGNOSIS — K8020 Calculus of gallbladder without cholecystitis without obstruction: Secondary | ICD-10-CM | POA: Insufficient documentation

## 2022-05-24 DIAGNOSIS — Z79899 Other long term (current) drug therapy: Secondary | ICD-10-CM | POA: Diagnosis not present

## 2022-05-24 DIAGNOSIS — S2241XA Multiple fractures of ribs, right side, initial encounter for closed fracture: Principal | ICD-10-CM

## 2022-05-24 DIAGNOSIS — Y9355 Activity, bike riding: Secondary | ICD-10-CM | POA: Diagnosis not present

## 2022-05-24 DIAGNOSIS — Z85118 Personal history of other malignant neoplasm of bronchus and lung: Secondary | ICD-10-CM | POA: Diagnosis not present

## 2022-05-24 DIAGNOSIS — Y92410 Unspecified street and highway as the place of occurrence of the external cause: Secondary | ICD-10-CM | POA: Insufficient documentation

## 2022-05-24 DIAGNOSIS — S299XXA Unspecified injury of thorax, initial encounter: Secondary | ICD-10-CM | POA: Diagnosis present

## 2022-05-24 DIAGNOSIS — R911 Solitary pulmonary nodule: Secondary | ICD-10-CM

## 2022-05-24 HISTORY — DX: Essential (primary) hypertension: I10

## 2022-05-24 LAB — URINALYSIS, ROUTINE W REFLEX MICROSCOPIC
Bacteria, UA: NONE SEEN
Bilirubin Urine: NEGATIVE
Glucose, UA: >=500 mg/dL — AB
Hgb urine dipstick: NEGATIVE
Ketones, ur: 5 mg/dL — AB
Leukocytes,Ua: NEGATIVE
Nitrite: NEGATIVE
Protein, ur: NEGATIVE mg/dL
Specific Gravity, Urine: 1.032 — ABNORMAL HIGH (ref 1.005–1.030)
pH: 5 (ref 5.0–8.0)

## 2022-05-24 LAB — COMPREHENSIVE METABOLIC PANEL
ALT: 17 U/L (ref 0–44)
AST: 21 U/L (ref 15–41)
Albumin: 3.5 g/dL (ref 3.5–5.0)
Alkaline Phosphatase: 46 U/L (ref 38–126)
Anion gap: 7 (ref 5–15)
BUN: 22 mg/dL (ref 8–23)
CO2: 23 mmol/L (ref 22–32)
Calcium: 8.3 mg/dL — ABNORMAL LOW (ref 8.9–10.3)
Chloride: 106 mmol/L (ref 98–111)
Creatinine, Ser: 1.17 mg/dL (ref 0.61–1.24)
GFR, Estimated: >60 mL/min (ref >60)
Glucose, Bld: 127 mg/dL — ABNORMAL HIGH (ref 70–99)
Potassium: 4.2 mmol/L (ref 3.5–5.1)
Sodium: 136 mmol/L (ref 135–145)
Total Bilirubin: 0.4 mg/dL (ref 0.3–1.2)
Total Protein: 7.2 g/dL (ref 6.5–8.1)

## 2022-05-24 LAB — CBC WITH DIFFERENTIAL/PLATELET
Abs Immature Granulocytes: 0.03 10*3/uL (ref 0.00–0.07)
Basophils Absolute: 0 10*3/uL (ref 0.0–0.1)
Basophils Relative: 0 %
Eosinophils Absolute: 0 10*3/uL (ref 0.0–0.5)
Eosinophils Relative: 0 %
HCT: 42.7 % (ref 39.0–52.0)
Hemoglobin: 13.6 g/dL (ref 13.0–17.0)
Immature Granulocytes: 0 %
Lymphocytes Relative: 8 %
Lymphs Abs: 0.8 10*3/uL (ref 0.7–4.0)
MCH: 29 pg (ref 26.0–34.0)
MCHC: 31.9 g/dL (ref 30.0–36.0)
MCV: 91 fL (ref 80.0–100.0)
Monocytes Absolute: 0.7 10*3/uL (ref 0.1–1.0)
Monocytes Relative: 7 %
Neutro Abs: 8.2 10*3/uL — ABNORMAL HIGH (ref 1.7–7.7)
Neutrophils Relative %: 85 %
Platelets: 177 10*3/uL (ref 150–400)
RBC: 4.69 MIL/uL (ref 4.22–5.81)
RDW: 12.7 % (ref 11.5–15.5)
WBC: 9.7 10*3/uL (ref 4.0–10.5)
nRBC: 0 % (ref 0.0–0.2)

## 2022-05-24 MED ORDER — HYDROMORPHONE HCL 1 MG/ML IJ SOLN
0.5000 mg | Freq: Once | INTRAMUSCULAR | Status: AC
  Administered 2022-05-24: 0.5 mg via INTRAVENOUS
  Filled 2022-05-24: qty 0.5

## 2022-05-24 MED ORDER — OXYCODONE HCL 5 MG PO TABS
5.0000 mg | ORAL_TABLET | ORAL | Status: DC | PRN
  Administered 2022-05-24 – 2022-05-25 (×4): 5 mg via ORAL
  Filled 2022-05-24 (×4): qty 1

## 2022-05-24 MED ORDER — KETOROLAC TROMETHAMINE 15 MG/ML IJ SOLN
15.0000 mg | Freq: Once | INTRAMUSCULAR | Status: AC
  Administered 2022-05-24: 15 mg via INTRAVENOUS
  Filled 2022-05-24: qty 1

## 2022-05-24 MED ORDER — DOCUSATE SODIUM 100 MG PO CAPS
100.0000 mg | ORAL_CAPSULE | Freq: Two times a day (BID) | ORAL | Status: DC
  Administered 2022-05-24 – 2022-05-25 (×3): 100 mg via ORAL
  Filled 2022-05-24 (×3): qty 1

## 2022-05-24 MED ORDER — SODIUM CHLORIDE 0.9 % IV BOLUS
500.0000 mL | Freq: Once | INTRAVENOUS | Status: AC
  Administered 2022-05-24: 500 mL via INTRAVENOUS

## 2022-05-24 MED ORDER — IOHEXOL 300 MG/ML  SOLN
100.0000 mL | Freq: Once | INTRAMUSCULAR | Status: AC | PRN
  Administered 2022-05-24: 100 mL via INTRAVENOUS
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
## 2022-05-24 NOTE — ED Notes (Signed)
Patient transported to CT 

## 2022-05-24 NOTE — Progress Notes (Signed)
Incentive instructed and given to patient
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
## 2022-05-25 ENCOUNTER — Observation Stay (HOSPITAL_COMMUNITY): Payer: Medicare Other

## 2022-05-25 MED ORDER — IBUPROFEN 400 MG PO TABS
400.0000 mg | ORAL_TABLET | Freq: Once | ORAL | Status: AC
  Administered 2022-05-25: 400 mg via ORAL
  Filled 2022-05-25: qty 1

## 2022-05-25 MED ORDER — IBUPROFEN 400 MG PO TABS: 400.0000 mg | ORAL_TABLET | Freq: Three times a day (TID) | ORAL | 0 refills | Status: DC | PRN

## 2022-05-25 MED ORDER — OXYCODONE HCL 5 MG PO TABS: 5.0000 mg | ORAL_TABLET | Freq: Four times a day (QID) | ORAL | 0 refills | Status: DC | PRN

## 2022-05-25 MED ORDER — METHOCARBAMOL 500 MG PO TABS: 500.0000 mg | ORAL_TABLET | Freq: Two times a day (BID) | ORAL | 0 refills | Status: DC | PRN

## 2022-05-25 MED ORDER — ACETAMINOPHEN ER 650 MG PO TBCR: 650.0000 mg | EXTENDED_RELEASE_TABLET | Freq: Three times a day (TID) | ORAL | 0 refills | Status: DC | PRN

## 2022-05-25 MED ORDER — DOCUSATE SODIUM 100 MG PO CAPS: 100.0000 mg | ORAL_CAPSULE | Freq: Two times a day (BID) | ORAL | 0 refills | Status: DC

## 2022-05-25 MED ORDER — OXYCODONE-ACETAMINOPHEN 5-325 MG PO TABS: 1.0000 | ORAL_TABLET | Freq: Four times a day (QID) | ORAL | 0 refills | Status: DC | PRN

## 2022-05-25 NOTE — Discharge Instructions (Signed)
Use the incentive spirometer every hour while awake.  Use the oxycodone for severe, breakthrough pain.  Prior to this use the ibuprofen, Tylenol, and Robaxin if needed.  Your CT scan showed a lung nodule that was a little larger than the last time it was imaged.  You need to follow-up with your primary doctor at the [facility] for further outpatient work-up and testing.

## 2022-05-25 NOTE — Care Management (Addendum)
Consult for medication assistance. Patient is not eligible for MATCH due to being  insured. He can use good rx for his perscriptions if that would assist him. CSW and MD messaged.

## 2022-05-25 NOTE — ED Notes (Signed)
[facility] called for transport back home. 

## 2022-05-25 NOTE — ED Notes (Signed)
Got pt a urinal ?

## 2022-05-25 NOTE — ED Provider Notes (Addendum)
Patient has boarded over night. Chest xray ordered, viewed by myself.  No pneumothorax seen. Patient states that he is doing better when he gets up but then developed some pain and requires pain medicine.  Offered for patient to go home with some pain control but he declines and feels like he still needs to be admitted.  1:03 PM I discussed case with Dr. Bedelia Person.  She recommends that at this point if he had been admitted to [facility] he would likely be discharged given that he is on room air, no pneumothorax on repeat x-ray, etc.  She recommends multi faceted pain approach with Tylenol, ibuprofen, Robaxin and oxycodone.  Patient agreeable to be discharged and is resting comfortably, eating lunch.  Will discharge home with these prescriptions as well as return precautions and he can follow-up with PCP.  He was made aware of the lung lesion and [facility]   Pricilla Loveless, MD 05/25/22 1308  1:57 PM Patient reports he wants to stay one more night.  I discussed at this point with him able to get out of bed, getting only oral pain medicine, this is not really an indication to stay.  He tells me that he is worried he is can have a hard time affording the medicine.  We will consult TOC and give a prepack of oxycodone but unfortunately I do not think that is a reason to keep him in the hospital with no respiratory distress, hypoxia, etc.  2:29 PM TOC indicates due to his insurance and [facility] status they cannot match his meds.  Offered to print good Rx but he will just try [facility] as he has a card there.  Will discharge.    Pricilla Loveless, MD 05/25/22 [phone]

## 2022-05-25 NOTE — ED Notes (Signed)
Pre-pack of narcotic pain medication, containing 6 oxycodone, given to [facility] transport to go with pt back home

## 2022-05-25 NOTE — ED Notes (Signed)
Signature pad unavailable for discharge signature  

## 2022-05-28 ENCOUNTER — Emergency Department (HOSPITAL_COMMUNITY): Payer: No Typology Code available for payment source

## 2022-05-28 ENCOUNTER — Encounter (HOSPITAL_COMMUNITY): Payer: Self-pay | Admitting: Emergency Medicine

## 2022-05-28 ENCOUNTER — Emergency Department (HOSPITAL_COMMUNITY)
Admission: EM | Admit: 2022-05-28 | Discharge: 2022-05-28 | Disposition: A | Discharge status: Home / Self Care | Payer: No Typology Code available for payment source | Attending: Emergency Medicine | Admitting: Emergency Medicine

## 2022-05-28 ENCOUNTER — Other Ambulatory Visit: Payer: Self-pay

## 2022-05-28 DIAGNOSIS — I10 Essential (primary) hypertension: Secondary | ICD-10-CM | POA: Insufficient documentation

## 2022-05-28 DIAGNOSIS — R042 Hemoptysis: Secondary | ICD-10-CM

## 2022-05-28 DIAGNOSIS — S2241XD Multiple fractures of ribs, right side, subsequent encounter for fracture with routine healing: Secondary | ICD-10-CM

## 2022-05-28 DIAGNOSIS — S2231XD Fracture of one rib, right side, subsequent encounter for fracture with routine healing: Secondary | ICD-10-CM | POA: Insufficient documentation

## 2022-05-28 DIAGNOSIS — F17200 Nicotine dependence, unspecified, uncomplicated: Secondary | ICD-10-CM | POA: Insufficient documentation

## 2022-05-28 DIAGNOSIS — S299XXD Unspecified injury of thorax, subsequent encounter: Secondary | ICD-10-CM | POA: Diagnosis present

## 2022-05-28 DIAGNOSIS — R911 Solitary pulmonary nodule: Secondary | ICD-10-CM | POA: Diagnosis not present

## 2022-05-28 DIAGNOSIS — N289 Disorder of kidney and ureter, unspecified: Secondary | ICD-10-CM | POA: Diagnosis not present

## 2022-05-28 DIAGNOSIS — E119 Type 2 diabetes mellitus without complications: Secondary | ICD-10-CM | POA: Diagnosis not present

## 2022-05-28 LAB — CBC WITH DIFFERENTIAL/PLATELET
Abs Immature Granulocytes: 0.02 10*3/uL (ref 0.00–0.07)
Basophils Absolute: 0 10*3/uL (ref 0.0–0.1)
Basophils Relative: 0 %
Eosinophils Absolute: 0 10*3/uL (ref 0.0–0.5)
Eosinophils Relative: 1 %
HCT: 46.8 % (ref 39.0–52.0)
Hemoglobin: 15.3 g/dL (ref 13.0–17.0)
Immature Granulocytes: 0 %
Lymphocytes Relative: 20 %
Lymphs Abs: 1.3 10*3/uL (ref 0.7–4.0)
MCH: 29.4 pg (ref 26.0–34.0)
MCHC: 32.7 g/dL (ref 30.0–36.0)
MCV: 89.8 fL (ref 80.0–100.0)
Monocytes Absolute: 0.5 10*3/uL (ref 0.1–1.0)
Monocytes Relative: 7 %
Neutro Abs: 4.9 10*3/uL (ref 1.7–7.7)
Neutrophils Relative %: 72 %
Platelets: 189 10*3/uL (ref 150–400)
RBC: 5.21 MIL/uL (ref 4.22–5.81)
RDW: 12.7 % (ref 11.5–15.5)
WBC: 6.8 10*3/uL (ref 4.0–10.5)
nRBC: 0 % (ref 0.0–0.2)

## 2022-05-28 LAB — BASIC METABOLIC PANEL
Anion gap: 8 (ref 5–15)
BUN: 21 mg/dL (ref 8–23)
CO2: 25 mmol/L (ref 22–32)
Calcium: 9 mg/dL (ref 8.9–10.3)
Chloride: 103 mmol/L (ref 98–111)
Creatinine, Ser: 1.37 mg/dL — ABNORMAL HIGH (ref 0.61–1.24)
GFR, Estimated: 55 mL/min — ABNORMAL LOW (ref >60)
Glucose, Bld: 159 mg/dL — ABNORMAL HIGH (ref 70–99)
Potassium: 4.1 mmol/L (ref 3.5–5.1)
Sodium: 136 mmol/L (ref 135–145)

## 2022-05-28 MED ORDER — IOHEXOL 300 MG/ML  SOLN
75.0000 mL | Freq: Once | INTRAMUSCULAR | Status: AC | PRN
  Administered 2022-05-28: 75 mL via INTRAVENOUS

## 2022-05-28 NOTE — ED Notes (Signed)
RN found phone, ID card, and insurance card in room. Family called and stated they will pick up belongings at the [facility] registration desk.
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
## 2022-05-28 NOTE — ED Notes (Signed)
Ice pack applied to first IV site where vein infiltrated

## 2022-05-28 NOTE — Discharge Instructions (Signed)
Your CT scan did show a nodule in the right lower lung.  You are being referred to a lung specialist to further evaluate that nodule.  Your scan did not show any serious problem that would lead to you coughing up blood.  However, if the amount of blood that you are coughing increases, return to the emergency department for further evaluation.
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
## 2022-05-28 NOTE — ED Notes (Signed)
Patient transported to CT 

## 2022-05-28 NOTE — ED Notes (Signed)
[facility] [employer] called to set up transport at this time. 

## 2022-05-28 NOTE — ED Notes (Addendum)
Upon discharge, pt states that he has no way home and no one to call. Will work on trying to secure transportation for pt. Discharge papers left at bedside

## 2022-06-20 ENCOUNTER — Institutional Professional Consult (permissible substitution): Payer: No Typology Code available for payment source | Admitting: Internal Medicine

## 2022-06-30 ENCOUNTER — Institutional Professional Consult (permissible substitution): Payer: No Typology Code available for payment source | Admitting: Internal Medicine

## 2022-06-30 ENCOUNTER — Ambulatory Visit (HOSPITAL_COMMUNITY)
Admission: RE | Admit: 2022-06-30 | Discharge: 2022-06-30 | Disposition: A | Discharge status: Home / Self Care | Payer: Medicare Other | Source: Ambulatory Visit | Attending: Family Medicine | Admitting: Family Medicine

## 2022-06-30 DIAGNOSIS — R918 Other nonspecific abnormal finding of lung field: Secondary | ICD-10-CM | POA: Diagnosis present

## 2022-06-30 DIAGNOSIS — M899 Disorder of bone, unspecified: Secondary | ICD-10-CM | POA: Insufficient documentation

## 2022-06-30 MED ORDER — IOHEXOL 300 MG/ML  SOLN
100.0000 mL | Freq: Once | INTRAMUSCULAR | Status: AC | PRN
  Administered 2022-06-30: 75 mL via INTRAVENOUS

## 2022-07-28 ENCOUNTER — Encounter: Payer: Self-pay | Admitting: Internal Medicine

## 2022-07-28 ENCOUNTER — Ambulatory Visit (INDEPENDENT_AMBULATORY_CARE_PROVIDER_SITE_OTHER): Payer: Medicare Other | Admitting: Internal Medicine

## 2022-07-28 DIAGNOSIS — F17210 Nicotine dependence, cigarettes, uncomplicated: Secondary | ICD-10-CM | POA: Diagnosis not present

## 2022-07-28 DIAGNOSIS — R911 Solitary pulmonary nodule: Secondary | ICD-10-CM | POA: Diagnosis not present

## 2022-07-28 DIAGNOSIS — J449 Chronic obstructive pulmonary disease, unspecified: Secondary | ICD-10-CM | POA: Diagnosis not present

## 2022-07-28 NOTE — Assessment & Plan Note (Signed)
Active smoker -  CT chest 06/2022 c/w severe emphysema  -  07/28/2022   Walked on RA  x  one  lap(s) =  approx 150  ft  @ fast pace, stopped due to "in a hurry to leave"  with lowest 02 sats 96%    Reports Not limited by breathing from desired activities  And no tendency to aecopd  - I suspect he has learned to live with limited ventilatory reserve but as long as denies limitation or exacerbation, no need for rx.  >>> return for pfts w/a

## 2022-07-28 NOTE — Progress Notes (Unsigned)
Gordon Robinson, male    DOB: 09-13-1951    MRN: [id number]   Brief patient profile:  26  yobm  active smoker  referred to pulmonary clinic in [place]  07/28/2022 by Dr Preston Fleeting for SPN    History of Present Illness  07/28/2022  Pulmonary/ 1st office eval/ Roddie Riegler / [facility]  Chief Complaint  Patient presents with   Consult    Consult for abnormal CT   Dyspnea:  walking around apts with some hill x 45 s sob  Cough: none  Sleep: no resp cc  SABA use: none   No obvious day to day or daytime pattern/variability or assoc excess/ purulent sputum or mucus plugs or hemoptysis or cp or chest tightness, subjective wheeze or overt sinus or hb symptoms.   Sleeping  without nocturnal  or early am exacerbation  of respiratory  c/o's or need for noct saba. Also denies any obvious fluctuation of symptoms with weather or environmental changes or other aggravating or alleviating factors except as outlined above   No unusual exposure hx or h/o childhood pna/ asthma or knowledge of premature birth.  Current Allergies, Complete Past Medical History, Past Surgical History, Family History, and Social History were reviewed in [employer] record.  ROS  The following are not active complaints unless bolded Hoarseness, sore throat, dysphagia, dental problems, itching, sneezing,  nasal congestion or discharge of excess mucus or purulent secretions, ear ache,   fever, chills, sweats, unintended wt loss or wt gain, classically pleuritic or exertional cp,  orthopnea pnd or arm/hand swelling  or leg swelling, presyncope, palpitations, abdominal pain, anorexia, nausea, vomiting, diarrhea  or change in bowel habits or change in bladder habits, change in stools or change in urine, dysuria, hematuria,  rash, arthralgias, visual complaints, headache, numbness, weakness or ataxia or problems with walking or coordination,  change in mood or  memory.             Past Medical History:  Diagnosis  Date   Diabetes mellitus    Hypertension     Outpatient Medications Prior to Visit  Medication Sig Dispense Refill   acetaminophen (TYLENOL 8 HOUR) 650 MG CR tablet Take 1 tablet (650 mg total) by mouth every 8 (eight) hours as needed for pain. 60 tablet 0   docusate sodium (COLACE) 100 MG capsule Take 1 capsule (100 mg total) by mouth every 12 (twelve) hours. 60 capsule 0   empagliflozin (JARDIANCE) 25 MG TABS tablet TAKE ONE TABLET BY MOUTH EVERY MORNING FOR DIABETES     fluticasone (FLONASE) 50 MCG/ACT nasal spray Place 2 sprays into both nostrils daily.     ibuprofen (ADVIL) 400 MG tablet Take 1 tablet (400 mg total) by mouth every 8 (eight) hours as needed. 60 tablet 0   levothyroxine (SYNTHROID, LEVOTHROID) 112 MCG tablet Take 112 mcg by mouth daily before breakfast.     methocarbamol (ROBAXIN) 500 MG tablet Take 1 tablet (500 mg total) by mouth 2 (two) times daily as needed for muscle spasms. 20 tablet 0   oxyCODONE (ROXICODONE) 5 MG immediate release tablet Take 1 tablet (5 mg total) by mouth every 6 (six) hours as needed for severe pain. 15 tablet 0   oxyCODONE-acetaminophen (PERCOCET/ROXICET) 5-325 MG tablet Take 1 tablet by mouth every 6 (six) hours as needed for severe pain. 6 tablet 0   simvastatin (ZOCOR) 10 MG tablet Take 10 mg by mouth daily.     No facility-administered medications prior to visit.  Objective:     BP 132/78 (BP Location: Right Arm, Patient Position: Sitting)   Pulse 98   Temp 98 F (36.7 C) (Temporal)   Ht 5\' 11"  (1.803 m)   Wt 152 lb 12.8 oz (69.3 kg)   SpO2 96% Comment: ra  BMI 21.31 kg/m   SpO2: 96 % (ra)  Jovial amb thin bm nad     HEENT :  Oropharynx  clear/ top detures   Nasal turbinates nl    NECK :  without JVD/Nodes/TM/ nl carotid upstrokes bilaterally   LUNGS: no acc muscle use,  Mod barrel  contour chest wall with bilateral  Distant bs s audible wheeze and  without cough on insp or exp maneuvers and mod  Hyperresonant  to   percussion bilaterally     CV:  RRR  no s3 or murmur or increase in P2, and no edema   ABD:  soft and nontender with pos mid insp Hoover's  in the supine position. No bruits or organomegaly appreciated, bowel sounds nl  MS:   Ext warm without deformities or   obvious joint restrictions , calf tenderness, cyanosis or clubbing  SKIN: warm and dry without lesions    NEURO:  alert, approp, nl sensorium with  no motor or cerebellar deficits apparent.               I personally reviewed images and agree with radiology impression as follows:   Chest CT with contrast 06/30/22 1. Healing fourth through ninth posterior right rib fractures. There is adjacent pleural thickening and minimal ground-glass opacities which may represent pulmonary contusion. 2. Unchanged 11 mm right lower lobe nodule.   3. Mildly enlarged right hilar lymph node, indeterminate. 4. Severe emphysema. My review:   originally  10.9 mm 09/11/2021 with neg PET 11/07/21     Assessment   Solitary pulmonary nodule on lung CT Active smoker Originally detected  RLL x  10.9 mm 09/11/2021 with neg PET 11/07/21  Chest CT with contrast 06/30/22 1. Healing fourth through ninth posterior right rib fractures. There is adjacent pleural thickening and minimal ground-glass opacities which may represent pulmonary contusion. 2. Unchanged 11 mm right lower lobe nodule.   3. Mildly enlarged right hilar lymph node, indeterminate (41mm) 4. Severe emphysema.  With neg pet and no change x almost a year there is no need for repeat study or any intervention now - rec repeat CT in 6 m (placed in reminder file) and eval copd in meantime with pfts  Discussed in detail all the  indications, usual  risks and alternatives  relative to the benefits with patient who agrees to proceed with conservative f/u as outlined      COPD GOLD ? / active smoker Active smoker -  CT chest 06/2022 c/w severe emphysema  -  07/28/2022   Walked on RA  x  one  lap(s) =   approx 150  ft  @ fast pace, stopped due to "in a hurry to leave"  with lowest 02 sats 96%    Reports Not limited by breathing from desired activities  And no tendency to aecopd  - I suspect he has learned to live with limited ventilatory reserve but as long as denies limitation or exacerbation, no need for rx.  >>> return for pfts w/a  Cigarette smoker Counseled re importance of smoking cessation but did not meet time criteria for separate billing     Each maintenance medication was reviewed in detail including emphasizing most  importantly the difference between maintenance and prns and under what circumstances the prns are to be triggered using an action plan format where appropriate.  Total time for H and P, chart review, counseling,  directly observing portions of ambulatory 02 saturation study/ and generating customized AVS unique to this office visit / same day charting = 48 min for new pt eval           Sandrea Hughs, MD 07/28/2022

## 2022-07-28 NOTE — Assessment & Plan Note (Signed)
Active smoker Originally detected  RLL x  10.9 mm 09/11/2021 with neg PET 11/07/21  Chest CT with contrast 06/30/22 1. Healing fourth through ninth posterior right rib fractures. There is adjacent pleural thickening and minimal ground-glass opacities which may represent pulmonary contusion. 2. Unchanged 11 mm right lower lobe nodule.   3. Mildly enlarged right hilar lymph node, indeterminate (48mm) 4. Severe emphysema.  With neg pet and no change x almost a year there is no need for repeat study or any intervention now - rec repeat CT in 6 m (placed in reminder file) and eval copd in meantime with pfts  Discussed in detail all the  indications, usual  risks and alternatives  relative to the benefits with patient who agrees to proceed with conservative f/u as outlined

## 2022-07-28 NOTE — Patient Instructions (Signed)
PFTs here next available.  The key is to stop smoking completely before smoking completely stops you!   Please schedule a follow up visit in 4  months but call sooner if needed

## 2022-07-29 ENCOUNTER — Encounter: Payer: Self-pay | Admitting: Internal Medicine

## 2022-07-29 DIAGNOSIS — F17210 Nicotine dependence, cigarettes, uncomplicated: Secondary | ICD-10-CM | POA: Insufficient documentation

## 2022-07-29 NOTE — Assessment & Plan Note (Signed)
Counseled re importance of smoking cessation but did not meet time criteria for separate billing     Each maintenance medication was reviewed in detail including emphasizing most importantly the difference between maintenance and prns and under what circumstances the prns are to be triggered using an action plan format where appropriate.  Total time for H and P, chart review, counseling,  directly observing portions of ambulatory 02 saturation study/ and generating customized AVS unique to this office visit / same day charting = 48 min for new pt eval

## 2022-09-23 ENCOUNTER — Encounter (HOSPITAL_COMMUNITY): Payer: No Typology Code available for payment source

## 2022-09-30 ENCOUNTER — Ambulatory Visit (HOSPITAL_COMMUNITY)
Admission: RE | Admit: 2022-09-30 | Discharge: 2022-09-30 | Disposition: A | Discharge status: Home / Self Care | Payer: Medicare Other | Source: Ambulatory Visit | Attending: Internal Medicine | Admitting: Internal Medicine

## 2022-09-30 DIAGNOSIS — J449 Chronic obstructive pulmonary disease, unspecified: Secondary | ICD-10-CM | POA: Insufficient documentation

## 2022-09-30 LAB — PULMONARY FUNCTION TEST
DL/VA % pred: 86 %
DL/VA: 3.47 ml/min/mmHg/L
DLCO unc % pred: 67 %
DLCO unc: 18.07 ml/min/mmHg
FEF 25-75 Pre: 2.42 L/sec
FEF2575-%Pred-Pre: 94 %
FEV1-%Change-Post: -26 %
FEV1-%Pred-Post: 56 %
FEV1-%Pred-Pre: 76 %
FEV1-Post: 1.91 L
FEV1-Pre: 2.59 L
FEV1FVC-%Change-Post: 1 %
FEV1FVC-%Pred-Pre: 99 %
FEV6-%Change-Post: -23 %
FEV6-%Pred-Post: 59 %
FEV6-%Pred-Pre: 77 %
FEV6-Post: 2.58 L
FEV6-Pre: 3.36 L
FEV6FVC-%Pred-Post: 105 %
FEV6FVC-%Pred-Pre: 105 %
FVC-%Change-Post: -27 %
FVC-%Pred-Post: 56 %
FVC-%Pred-Pre: 77 %
FVC-Post: 2.58 L
FVC-Pre: 3.54 L
Post FEV1/FVC ratio: 74 %
Post FEV6/FVC ratio: 100 %
Pre FEV1/FVC ratio: 73 %
Pre FEV6/FVC Ratio: 100 %
RV % pred: 8 %
RV: 0.21 L
TLC % pred: 48 %
TLC: 3.51 L

## 2022-09-30 MED ORDER — ALBUTEROL SULFATE (2.5 MG/3ML) 0.083% IN NEBU
2.5000 mg | INHALATION_SOLUTION | Freq: Once | RESPIRATORY_TRACT | Status: AC
  Administered 2022-09-30: 2.5 mg via RESPIRATORY_TRACT

## 2022-10-02 ENCOUNTER — Telehealth: Payer: Self-pay | Admitting: Internal Medicine

## 2022-10-02 NOTE — Telephone Encounter (Signed)
Study is not diagnostic of anything specific but does rule out copd and asthma so needs ov with all meds to regroup if still having symptoms

## 2022-10-02 NOTE — Telephone Encounter (Signed)
s/w Pt's brother regarding his follow-up appt and brother Casimiro Needle was asking about the PFT results.  Please advise.   Pft was done on 09/30/2022. Dr. Sherene Sires have you reviewed patients PFT yet? Please advise, thanks!

## 2022-10-03 NOTE — Telephone Encounter (Signed)
Spoke with patients brother Casimiro Needle (ok per dpr) regarding PFT results. They verbalized understanding. No further questions. Ov was scheduled for 11/05/2021 at 2:45 pm advised patients brother to call our office if he needs [place] before then.  Nothing further needed at this time.

## 2022-11-05 ENCOUNTER — Encounter: Payer: Self-pay | Admitting: Internal Medicine

## 2022-11-05 ENCOUNTER — Ambulatory Visit (INDEPENDENT_AMBULATORY_CARE_PROVIDER_SITE_OTHER): Payer: Medicare Other | Admitting: Internal Medicine

## 2022-11-05 VITALS — BP 128/78 | HR 88 | Temp 98.0°F | Ht 71.0 in | Wt 149.6 lb

## 2022-11-05 DIAGNOSIS — R911 Solitary pulmonary nodule: Secondary | ICD-10-CM

## 2022-11-05 DIAGNOSIS — F17210 Nicotine dependence, cigarettes, uncomplicated: Secondary | ICD-10-CM

## 2022-11-05 DIAGNOSIS — J449 Chronic obstructive pulmonary disease, unspecified: Secondary | ICD-10-CM

## 2022-11-05 NOTE — Assessment & Plan Note (Signed)
Active smoker Originally detected  RLL x  10.9 mm 09/11/2021 with neg PET 11/07/21  Chest CT with contrast 06/30/22 1. Healing fourth through ninth posterior right rib fractures. There is adjacent pleural thickening and minimal ground-glass opacities which may represent pulmonary contusion. 2. Unchanged 11 mm right lower lobe nodule.   3. Mildly enlarged right hilar lymph node, indeterminate (101mm) 4. Severe emphysema. - CT chest s contrast  11/05/2022 >>>   Discussed in detail all the  indications, usual  risks and alternatives  relative to the benefits with patient who agrees to proceed with w/u as outlined.

## 2022-11-05 NOTE — Progress Notes (Signed)
Gordon Robinson, male    DOB: April 24, 1951    MRN: [id number]   Brief patient profile:  22  yobm  active smoker  referred to pulmonary clinic in [place]  1/69/6789 by Dr Roxanne Mins for SPN    History of Present Illness  07/28/2022  Pulmonary/ 1st office eval/ [name] / [place] [facility]  Chief Complaint  Patient presents with   Consult    Consult for abnormal CT   Dyspnea:  walking around apts with some hill x 5m s sob  Cough: none  Sleep: no resp cc  SABA use: none  Rec PFTs here next available. The key is to stop smoking completely before smoking completely stops you!   11/05/2022  f/u ov/[place] office/[name] re: GOLD 0 maint on no rx but still smoking   Chief Complaint  Patient presents with   Follow-up    Breathing better since last ov pft done 11/28   Dyspnea:  denies limiting doe but not very active  Cough: none Sleeping: no resp cc / had cpap not using SABA use: none  02: none Covid status: vax missing last shot      No obvious day to day or daytime variability or assoc excess/ purulent sputum or mucus plugs or hemoptysis or cp or chest tightness, subjective wheeze or overt sinus or hb symptoms.   Sleeping  without nocturnal  or early am exacerbation  of respiratory  c/o's or need for noct saba. Also denies any obvious fluctuation of symptoms with weather or environmental changes or other aggravating or alleviating factors except as outlined above   No unusual exposure hx or h/o childhood pna/ asthma or knowledge of premature birth.  Current Allergies, Complete Past Medical History, Past Surgical History, Family History, and Social History were reviewed in [employer] record.  ROS  The following are not active complaints unless bolded Hoarseness, sore throat, dysphagia, dental problems, itching, sneezing,  nasal congestion or discharge of excess mucus or purulent secretions, ear ache,   fever, chills, sweats, unintended wt loss or wt gain, classically  pleuritic or exertional cp,  orthopnea pnd or arm/hand swelling  or leg swelling, presyncope, palpitations, abdominal pain, anorexia, nausea, vomiting, diarrhea  or change in bowel habits or change in bladder habits, change in stools or change in urine, dysuria, hematuria,  rash, arthralgias, visual complaints, headache, numbness, weakness or ataxia or problems with walking or coordination,  change in mood or  memory.        Current Meds  Medication Sig   acetaminophen (TYLENOL 8 HOUR) 650 MG CR tablet Take 1 tablet (650 mg total) by mouth every 8 (eight) hours as needed for pain.   docusate sodium (COLACE) 100 MG capsule Take 1 capsule (100 mg total) by mouth every 12 (twelve) hours.   empagliflozin (JARDIANCE) 25 MG TABS tablet TAKE ONE TABLET BY MOUTH EVERY MORNING FOR DIABETES   fluticasone (FLONASE) 50 MCG/ACT nasal spray Place 2 sprays into both nostrils daily.   ibuprofen (ADVIL) 400 MG tablet Take 1 tablet (400 mg total) by mouth every 8 (eight) hours as needed.   levothyroxine (SYNTHROID, LEVOTHROID) 112 MCG tablet Take 112 mcg by mouth daily before breakfast.   methocarbamol (ROBAXIN) 500 MG tablet Take 1 tablet (500 mg total) by mouth 2 (two) times daily as needed for muscle spasms.   oxyCODONE (ROXICODONE) 5 MG immediate release tablet Take 1 tablet (5 mg total) by mouth every 6 (six) hours as needed for severe pain.  oxyCODONE-acetaminophen (PERCOCET/ROXICET) 5-325 MG tablet Take 1 tablet by mouth every 6 (six) hours as needed for severe pain.   simvastatin (ZOCOR) 10 MG tablet Take 10 mg by mouth daily.                       Past Medical History:  Diagnosis Date   Diabetes mellitus    Hypertension        Objective:    Wt Readings from Last 3 Encounters:  11/05/22 149 lb 9.6 oz (67.9 kg)  07/28/22 152 lb 12.8 oz (69.3 kg)  05/28/22 149 lb 14.6 oz (68 kg)      Vital signs reviewed  11/05/2022  - Note at rest 02 sats  97% on RA   General appearance:    amb bm nad        HEENT : Oropharynx  clear      NECK :  without  apparent JVD/ palpable Nodes/TM    LUNGS: no acc muscle use,  Min barrel  contour chest wall with bilateral  slightly decreased bs s audible wheeze and  without cough on insp or exp maneuvers and min  Hyperresonant  to  percussion bilaterally    CV:  RRR  no s3 or murmur or increase in P2, and no edema   ABD:  soft and nontender with pos end  insp Hoover's  in the supine position.  No bruits or organomegaly appreciated   MS:  Nl gait/ ext warm without deformities Or obvious joint restrictions  calf tenderness, cyanosis or clubbing     SKIN: warm and dry without lesions    NEURO:  alert, approp, nl sensorium with  no motor or cerebellar deficits apparent.                Assessment

## 2022-11-05 NOTE — Assessment & Plan Note (Signed)
Counseled re importance of smoking cessation but did not meet time criteria for separate billing           Each maintenance medication was reviewed in detail including emphasizing most importantly the difference between maintenance and prns and under what circumstances the prns are to be triggered using an action plan format where appropriate.  Total time for H and P, chart review, counseling,  and generating customized AVS unique to this office visit / same day charting  > 30 min multiple pulmonary issues evaluated.

## 2022-11-05 NOTE — Assessment & Plan Note (Signed)
Active smoker -  CT chest 06/2022 c/w severe emphysema  -  07/28/2022   Walked on RA  x  one  lap(s) =  approx 150  ft  @ fast pace, stopped due to "in a hurry to leave"  with lowest 02 sats 96%   - PFT's  09/30/22  FEV1 2.59 (76 % ) ratio 0.73  p 0 % improvement from saba p 0 prior to study with DLCO  18 (67%)   and FV curve erratic    - 11/05/2022   Walked on RA  x  2  lap(s) =  approx 300  ft  @ mod pace, with lowest 02 sats 97% s sob   > 3 min discussion I reviewed the Fletcher curve with the patient that basically indicates  if you quit smoking when your best day FEV1 is still well preserved (as is clearly still  the case here)  it is highly unlikely you will progress to severe disease and informed the patient there was  no medication on the market that has proven to alter the curve/ its downward trajectory  or the likelihood of progression of their disease(unlike other chronic medical conditions such as atheroclerosis where we do think we can change the natural hx with risk reducing meds)    Therefore stopping smoking and maintaining abstinence are  the most important aspects of care, not choice of inhalers or for that matter, pulmonary doctors.   Treatment other than smoking cessation  is entirely directed by severity of symptoms and focused also on reducing exacerbations, not attempting to change the natural history of the disease.  Since not limited by sob or having aecopd no need for rx at present.

## 2022-11-05 NOTE — Patient Instructions (Signed)
You do not have significant copd at this point so it's not too late to quit smoking   My office will be contacting you by phone for referral to ct chest  - if you don't hear back from my office within one week please call us back or notify us thru MyChart and we'll address it right away.   Please schedule a follow up visit in 6 months but call sooner if needed

## 2022-12-23 ENCOUNTER — Ambulatory Visit (HOSPITAL_COMMUNITY)
Admission: RE | Admit: 2022-12-23 | Discharge: 2022-12-23 | Disposition: A | Discharge status: Home / Self Care | Payer: 59 | Source: Ambulatory Visit | Attending: Internal Medicine | Admitting: Internal Medicine

## 2022-12-23 DIAGNOSIS — R911 Solitary pulmonary nodule: Secondary | ICD-10-CM | POA: Diagnosis not present

## 2023-05-15 ENCOUNTER — Emergency Department (HOSPITAL_COMMUNITY): Payer: No Typology Code available for payment source

## 2023-05-15 ENCOUNTER — Other Ambulatory Visit: Payer: Self-pay

## 2023-05-15 ENCOUNTER — Inpatient Hospital Stay (HOSPITAL_COMMUNITY)
Admission: EM | Admit: 2023-05-15 | Discharge: 2023-05-26 | DRG: 515 | Disposition: A | Discharge status: Skilled Nursing Facility | Payer: No Typology Code available for payment source | Attending: Student | Admitting: Student

## 2023-05-15 ENCOUNTER — Encounter (HOSPITAL_COMMUNITY): Payer: Self-pay | Admitting: *Deleted

## 2023-05-15 DIAGNOSIS — Z79899 Other long term (current) drug therapy: Secondary | ICD-10-CM

## 2023-05-15 DIAGNOSIS — S32591A Other specified fracture of right pubis, initial encounter for closed fracture: Secondary | ICD-10-CM | POA: Diagnosis present

## 2023-05-15 DIAGNOSIS — S50811A Abrasion of right forearm, initial encounter: Secondary | ICD-10-CM | POA: Diagnosis present

## 2023-05-15 DIAGNOSIS — T40605A Adverse effect of unspecified narcotics, initial encounter: Secondary | ICD-10-CM | POA: Diagnosis present

## 2023-05-15 DIAGNOSIS — F17200 Nicotine dependence, unspecified, uncomplicated: Secondary | ICD-10-CM | POA: Diagnosis not present

## 2023-05-15 DIAGNOSIS — Z7901 Long term (current) use of anticoagulants: Secondary | ICD-10-CM | POA: Diagnosis not present

## 2023-05-15 DIAGNOSIS — F17210 Nicotine dependence, cigarettes, uncomplicated: Secondary | ICD-10-CM | POA: Diagnosis not present

## 2023-05-15 DIAGNOSIS — I10 Essential (primary) hypertension: Secondary | ICD-10-CM | POA: Diagnosis present

## 2023-05-15 DIAGNOSIS — S40811A Abrasion of right upper arm, initial encounter: Secondary | ICD-10-CM | POA: Diagnosis present

## 2023-05-15 DIAGNOSIS — K30 Functional dyspepsia: Secondary | ICD-10-CM | POA: Diagnosis present

## 2023-05-15 DIAGNOSIS — S32444A Nondisplaced fracture of posterior column [ilioischial] of right acetabulum, initial encounter for closed fracture: Principal | ICD-10-CM | POA: Diagnosis present

## 2023-05-15 DIAGNOSIS — K5903 Drug induced constipation: Secondary | ICD-10-CM | POA: Diagnosis present

## 2023-05-15 DIAGNOSIS — E43 Unspecified severe protein-calorie malnutrition: Secondary | ICD-10-CM | POA: Insufficient documentation

## 2023-05-15 DIAGNOSIS — K219 Gastro-esophageal reflux disease without esophagitis: Secondary | ICD-10-CM | POA: Diagnosis present

## 2023-05-15 DIAGNOSIS — E559 Vitamin D deficiency, unspecified: Secondary | ICD-10-CM | POA: Diagnosis present

## 2023-05-15 DIAGNOSIS — Y92481 Parking lot as the place of occurrence of the external cause: Secondary | ICD-10-CM

## 2023-05-15 DIAGNOSIS — Z79890 Hormone replacement therapy (postmenopausal): Secondary | ICD-10-CM

## 2023-05-15 DIAGNOSIS — Z7984 Long term (current) use of oral hypoglycemic drugs: Secondary | ICD-10-CM

## 2023-05-15 DIAGNOSIS — E1165 Type 2 diabetes mellitus with hyperglycemia: Secondary | ICD-10-CM | POA: Diagnosis not present

## 2023-05-15 DIAGNOSIS — E782 Mixed hyperlipidemia: Secondary | ICD-10-CM | POA: Insufficient documentation

## 2023-05-15 DIAGNOSIS — E039 Hypothyroidism, unspecified: Secondary | ICD-10-CM | POA: Diagnosis present

## 2023-05-15 DIAGNOSIS — E119 Type 2 diabetes mellitus without complications: Secondary | ICD-10-CM

## 2023-05-15 DIAGNOSIS — Z6820 Body mass index (BMI) 20.0-20.9, adult: Secondary | ICD-10-CM | POA: Diagnosis not present

## 2023-05-15 DIAGNOSIS — J439 Emphysema, unspecified: Secondary | ICD-10-CM | POA: Diagnosis not present

## 2023-05-15 DIAGNOSIS — S32401A Unspecified fracture of right acetabulum, initial encounter for closed fracture: Secondary | ICD-10-CM | POA: Diagnosis not present

## 2023-05-15 LAB — CBC WITH DIFFERENTIAL/PLATELET
Abs Immature Granulocytes: 0.04 10*3/uL (ref 0.00–0.07)
Basophils Absolute: 0.1 10*3/uL (ref 0.0–0.1)
Basophils Relative: 0 %
Eosinophils Absolute: 0.1 10*3/uL (ref 0.0–0.5)
Eosinophils Relative: 1 %
HCT: 49.9 % (ref 39.0–52.0)
Hemoglobin: 16 g/dL (ref 13.0–17.0)
Immature Granulocytes: 0 %
Lymphocytes Relative: 10 %
Lymphs Abs: 1.3 10*3/uL (ref 0.7–4.0)
MCH: 29.6 pg (ref 26.0–34.0)
MCHC: 32.1 g/dL (ref 30.0–36.0)
MCV: 92.2 fL (ref 80.0–100.0)
Monocytes Absolute: 0.7 10*3/uL (ref 0.1–1.0)
Monocytes Relative: 5 %
Neutro Abs: 11 10*3/uL — ABNORMAL HIGH (ref 1.7–7.7)
Neutrophils Relative %: 84 %
Platelets: 195 10*3/uL (ref 150–400)
RBC: 5.41 MIL/uL (ref 4.22–5.81)
RDW: 12.9 % (ref 11.5–15.5)
WBC: 13.2 10*3/uL — ABNORMAL HIGH (ref 4.0–10.5)
nRBC: 0 % (ref 0.0–0.2)

## 2023-05-15 LAB — TYPE AND SCREEN
ABO/RH(D): B POS
Antibody Screen: NEGATIVE

## 2023-05-15 LAB — COMPREHENSIVE METABOLIC PANEL
ALT: 19 U/L (ref 0–44)
AST: 22 U/L (ref 15–41)
Albumin: 3.9 g/dL (ref 3.5–5.0)
Alkaline Phosphatase: 71 U/L (ref 38–126)
Anion gap: 8 (ref 5–15)
BUN: 22 mg/dL (ref 8–23)
CO2: 24 mmol/L (ref 22–32)
Calcium: 9 mg/dL (ref 8.9–10.3)
Chloride: 103 mmol/L (ref 98–111)
Creatinine, Ser: 1.09 mg/dL (ref 0.61–1.24)
GFR, Estimated: >60 mL/min (ref >60)
Glucose, Bld: 190 mg/dL — ABNORMAL HIGH (ref 70–99)
Potassium: 4.1 mmol/L (ref 3.5–5.1)
Sodium: 135 mmol/L (ref 135–145)
Total Bilirubin: 0.4 mg/dL (ref 0.3–1.2)
Total Protein: 8.4 g/dL — ABNORMAL HIGH (ref 6.5–8.1)

## 2023-05-15 LAB — PROTIME-INR
INR: 0.9 (ref 0.8–1.2)
Prothrombin Time: 12.1 seconds (ref 11.4–15.2)

## 2023-05-15 MED ORDER — OXYCODONE-ACETAMINOPHEN 5-325 MG PO TABS
1.0000 | ORAL_TABLET | ORAL | Status: DC | PRN
  Administered 2023-05-15 – 2023-05-18 (×14): 1 via ORAL
  Filled 2023-05-15 (×14): qty 1

## 2023-05-15 MED ORDER — ONDANSETRON HCL 4 MG/2ML IJ SOLN: 4.0000 mg | Freq: Four times a day (QID) | INTRAMUSCULAR | Status: DC | PRN

## 2023-05-15 MED ORDER — INSULIN ASPART 100 UNIT/ML IJ SOLN
0.0000 [IU] | Freq: Three times a day (TID) | INTRAMUSCULAR | Status: DC
  Administered 2023-05-16: 1 [IU] via SUBCUTANEOUS
  Administered 2023-05-16 – 2023-05-18 (×4): 2 [IU] via SUBCUTANEOUS
  Administered 2023-05-20: 3 [IU] via SUBCUTANEOUS
  Administered 2023-05-21: 1 [IU] via SUBCUTANEOUS
  Administered 2023-05-22: 2 [IU] via SUBCUTANEOUS
  Administered 2023-05-22 – 2023-05-23 (×2): 1 [IU] via SUBCUTANEOUS
  Administered 2023-05-23: 3 [IU] via SUBCUTANEOUS
  Administered 2023-05-24: 2 [IU] via SUBCUTANEOUS
  Administered 2023-05-24 – 2023-05-25 (×3): 1 [IU] via SUBCUTANEOUS
  Administered 2023-05-26: 3 [IU] via SUBCUTANEOUS

## 2023-05-15 MED ORDER — ONDANSETRON HCL 4 MG PO TABS: 4.0000 mg | ORAL_TABLET | Freq: Four times a day (QID) | ORAL | Status: DC | PRN

## 2023-05-15 MED ORDER — ACETAMINOPHEN 325 MG PO TABS
650.0000 mg | ORAL_TABLET | Freq: Four times a day (QID) | ORAL | Status: DC | PRN
  Administered 2023-05-17 – 2023-05-25 (×7): 650 mg via ORAL
  Filled 2023-05-15 (×9): qty 2

## 2023-05-15 MED ORDER — OXYCODONE-ACETAMINOPHEN 5-325 MG PO TABS
1.0000 | ORAL_TABLET | Freq: Once | ORAL | Status: AC
  Administered 2023-05-15: 1 via ORAL
  Filled 2023-05-15: qty 1

## 2023-05-15 MED ORDER — ACETAMINOPHEN 650 MG RE SUPP: 650.0000 mg | Freq: Four times a day (QID) | RECTAL | Status: DC | PRN

## 2023-05-15 MED ORDER — HEPARIN SODIUM (PORCINE) 5000 UNIT/ML IJ SOLN
5000.0000 [IU] | Freq: Three times a day (TID) | INTRAMUSCULAR | Status: DC
  Administered 2023-05-16 – 2023-05-17 (×5): 5000 [IU] via SUBCUTANEOUS
  Filled 2023-05-15 (×5): qty 1

## 2023-05-15 NOTE — ED Notes (Signed)
Patient transported to CT 
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
## 2023-05-16 DIAGNOSIS — S32444A Nondisplaced fracture of posterior column [ilioischial] of right acetabulum, initial encounter for closed fracture: Secondary | ICD-10-CM | POA: Diagnosis not present

## 2023-05-16 LAB — COMPREHENSIVE METABOLIC PANEL
ALT: 18 U/L (ref 0–44)
AST: 19 U/L (ref 15–41)
Albumin: 3.2 g/dL — ABNORMAL LOW (ref 3.5–5.0)
Alkaline Phosphatase: 64 U/L (ref 38–126)
Anion gap: 9 (ref 5–15)
BUN: 20 mg/dL (ref 8–23)
CO2: 22 mmol/L (ref 22–32)
Calcium: 8.8 mg/dL — ABNORMAL LOW (ref 8.9–10.3)
Chloride: 105 mmol/L (ref 98–111)
Creatinine, Ser: 1.05 mg/dL (ref 0.61–1.24)
GFR, Estimated: >60 mL/min (ref >60)
Glucose, Bld: 154 mg/dL — ABNORMAL HIGH (ref 70–99)
Potassium: 3.7 mmol/L (ref 3.5–5.1)
Sodium: 136 mmol/L (ref 135–145)
Total Bilirubin: 0.5 mg/dL (ref 0.3–1.2)
Total Protein: 7.1 g/dL (ref 6.5–8.1)

## 2023-05-16 LAB — CBC
HCT: 43.1 % (ref 39.0–52.0)
Hemoglobin: 14.2 g/dL (ref 13.0–17.0)
MCH: 29.2 pg (ref 26.0–34.0)
MCHC: 32.9 g/dL (ref 30.0–36.0)
MCV: 88.7 fL (ref 80.0–100.0)
Platelets: 204 10*3/uL (ref 150–400)
RBC: 4.86 MIL/uL (ref 4.22–5.81)
RDW: 13 % (ref 11.5–15.5)
WBC: 10.7 10*3/uL — ABNORMAL HIGH (ref 4.0–10.5)
nRBC: 0 % (ref 0.0–0.2)

## 2023-05-16 LAB — GLUCOSE, CAPILLARY
Glucose-Capillary: 198 mg/dL — ABNORMAL HIGH (ref 70–99)
Glucose-Capillary: 133 mg/dL — ABNORMAL HIGH (ref 70–99)
Glucose-Capillary: 92 mg/dL (ref 70–99)
Glucose-Capillary: 124 mg/dL — ABNORMAL HIGH (ref 70–99)

## 2023-05-16 LAB — MAGNESIUM: Magnesium: 2.1 mg/dL (ref 1.7–2.4)

## 2023-05-16 LAB — PHOSPHORUS: Phosphorus: 4 mg/dL (ref 2.5–4.6)

## 2023-05-16 MED ORDER — HYDROMORPHONE HCL 1 MG/ML IJ SOLN
0.5000 mg | Freq: Once | INTRAMUSCULAR | Status: AC | PRN
  Administered 2023-05-16: 0.5 mg via INTRAVENOUS
  Filled 2023-05-16: qty 0.5

## 2023-05-16 MED ORDER — LEVOTHYROXINE SODIUM 137 MCG PO TABS
137.0000 ug | ORAL_TABLET | Freq: Every day | ORAL | Status: DC
  Administered 2023-05-16 – 2023-05-26 (×11): 137 ug via ORAL
  Filled 2023-05-16 (×11): qty 1

## 2023-05-16 MED ORDER — SIMVASTATIN 20 MG PO TABS
10.0000 mg | ORAL_TABLET | Freq: Every day | ORAL | Status: DC
  Administered 2023-05-16 – 2023-05-26 (×11): 10 mg via ORAL
  Filled 2023-05-16 (×11): qty 1

## 2023-05-16 MED ORDER — EMPAGLIFLOZIN 25 MG PO TABS
25.0000 mg | ORAL_TABLET | Freq: Every day | ORAL | Status: DC
  Administered 2023-05-16 – 2023-05-26 (×11): 25 mg via ORAL
  Filled 2023-05-16 (×11): qty 1

## 2023-05-16 NOTE — Plan of Care (Signed)
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
## 2023-05-17 DIAGNOSIS — S32444A Nondisplaced fracture of posterior column [ilioischial] of right acetabulum, initial encounter for closed fracture: Secondary | ICD-10-CM | POA: Diagnosis not present

## 2023-05-17 LAB — COMPREHENSIVE METABOLIC PANEL
ALT: 16 U/L (ref 0–44)
AST: 14 U/L — ABNORMAL LOW (ref 15–41)
Albumin: 3.1 g/dL — ABNORMAL LOW (ref 3.5–5.0)
Alkaline Phosphatase: 40 U/L (ref 38–126)
Anion gap: 9 (ref 5–15)
BUN: 21 mg/dL (ref 8–23)
CO2: 21 mmol/L — ABNORMAL LOW (ref 22–32)
Calcium: 8.7 mg/dL — ABNORMAL LOW (ref 8.9–10.3)
Chloride: 104 mmol/L (ref 98–111)
Creatinine, Ser: 1.21 mg/dL (ref 0.61–1.24)
GFR, Estimated: >60 mL/min (ref >60)
Glucose, Bld: 123 mg/dL — ABNORMAL HIGH (ref 70–99)
Potassium: 4.5 mmol/L (ref 3.5–5.1)
Sodium: 134 mmol/L — ABNORMAL LOW (ref 135–145)
Total Bilirubin: 0.7 mg/dL (ref 0.3–1.2)
Total Protein: 7.2 g/dL (ref 6.5–8.1)

## 2023-05-17 LAB — CBC
HCT: 45.3 % (ref 39.0–52.0)
Hemoglobin: 14.5 g/dL (ref 13.0–17.0)
MCH: 28.9 pg (ref 26.0–34.0)
MCHC: 32 g/dL (ref 30.0–36.0)
MCV: 90.2 fL (ref 80.0–100.0)
Platelets: 187 10*3/uL (ref 150–400)
RBC: 5.02 MIL/uL (ref 4.22–5.81)
RDW: 13.1 % (ref 11.5–15.5)
WBC: 8.3 10*3/uL (ref 4.0–10.5)
nRBC: 0 % (ref 0.0–0.2)

## 2023-05-17 LAB — GLUCOSE, CAPILLARY
Glucose-Capillary: 174 mg/dL — ABNORMAL HIGH (ref 70–99)
Glucose-Capillary: 87 mg/dL (ref 70–99)
Glucose-Capillary: 166 mg/dL — ABNORMAL HIGH (ref 70–99)
Glucose-Capillary: 137 mg/dL — ABNORMAL HIGH (ref 70–99)

## 2023-05-17 NOTE — Plan of Care (Signed)
  Problem: Health Behavior/Discharge Planning: Goal: Ability to manage health-related needs will improve Outcome: Progressing   Problem: Nutritional: Goal: Maintenance of adequate nutrition will improve Outcome: Progressing   Problem: Skin Integrity: Goal: Risk for impaired skin integrity will decrease Outcome: Progressing   
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
## 2023-05-17 NOTE — Progress Notes (Signed)
     Subjective:  Patient reports pain as mild to moderate.  Pain feels well controlled.  Anticipating surgery with Dr. Carola Frost tomorrow for comminuted displaced acetabulum fracture.  NPO past midnight.  Denies numbness or tingling of extremity.  Currently bed rest with NWB status.  No other complaints.  Objective:   VITALS:   Vitals:   05/16/23 0758 05/16/23 1541 05/16/23 2029 05/17/23 0445  BP: 134/71 136/69 135/67 125/74  Pulse: 71 76 77 79  Resp:  16 16 18   Temp: 97.7 F (36.5 C) 98.2 F (36.8 C) 98.3 F (36.8 C)   TempSrc:      SpO2: 98% 98% 97% 96%  Weight:      Height:        EXAMINATION: AAOX4, sitting comfortably  RLE     mild discomfort with right hip range of motion, mild tenderness about the right hip.                              No knee or ankle effusion             Sens DPN, SPN, TN intact             Motor EHL, ext, flex 5/5             Well perfused No significant edema   LLENo traumatic wounds, ecchymosis, or rash             Nontender             Negative log roll             No knee or ankle effusion             Sens DPN, SPN, TN intact             Motor EHL, ext, flex 5/5             Well perfused No significant edema   Lab Results  Component Value Date   WBC 8.3 05/17/2023   HGB 14.5 05/17/2023   HCT 45.3 05/17/2023   MCV 90.2 05/17/2023   PLT 187 05/17/2023   BMET    Component Value Date/Time   NA 134 (L) 05/17/2023 0133   K 4.5 05/17/2023 0133   CL 104 05/17/2023 0133   CO2 21 (L) 05/17/2023 0133   GLUCOSE 123 (H) 05/17/2023 0133   BUN 21 05/17/2023 0133   CREATININE 1.21 05/17/2023 0133   CALCIUM 8.7 (L) 05/17/2023 0133   GFRNONAA >60 05/17/2023 0133    Xray: stable imaging  Assessment/Plan:     Principal Problem:   Acetabulum fracture, right (HCC) Active Problems:   Abrasion of right forearm   Type 2 diabetes mellitus (HCC)   Acquired hypothyroidism   Mixed hyperlipidemia   Tobacco use disorder   Plan: Plan is  still reduction for fixation to stabilize the comminuted displaced acetabulum fracture with Dr. Carola Frost tomorrow pending or availability.  NPO after midnight.  Continue bedrest and NWB of RLE.   Cecil Cobbs 05/17/2023, 8:02 AM   Weber Cooks, MD  Contact information:   [phone] 7am-5pm epic message Dr. Blanchie Dessert, or call office for patient follow up: [phone] After hours and holidays please check [email or web address] for group call information for [facility]

## 2023-05-17 NOTE — Plan of Care (Signed)

## 2023-05-18 ENCOUNTER — Inpatient Hospital Stay (HOSPITAL_COMMUNITY): Payer: No Typology Code available for payment source

## 2023-05-18 DIAGNOSIS — S32444A Nondisplaced fracture of posterior column [ilioischial] of right acetabulum, initial encounter for closed fracture: Secondary | ICD-10-CM | POA: Diagnosis not present

## 2023-05-18 LAB — CBC
HCT: 45 % (ref 39.0–52.0)
Hemoglobin: 14.8 g/dL (ref 13.0–17.0)
MCH: 30 pg (ref 26.0–34.0)
MCHC: 32.9 g/dL (ref 30.0–36.0)
MCV: 91.3 fL (ref 80.0–100.0)
Platelets: 201 10*3/uL (ref 150–400)
RBC: 4.93 MIL/uL (ref 4.22–5.81)
RDW: 12.6 % (ref 11.5–15.5)
WBC: 8.3 10*3/uL (ref 4.0–10.5)
nRBC: 0 % (ref 0.0–0.2)

## 2023-05-18 LAB — COMPREHENSIVE METABOLIC PANEL
ALT: 16 U/L (ref 0–44)
AST: 12 U/L — ABNORMAL LOW (ref 15–41)
Albumin: 3.1 g/dL — ABNORMAL LOW (ref 3.5–5.0)
Alkaline Phosphatase: 42 U/L (ref 38–126)
Anion gap: 7 (ref 5–15)
BUN: 22 mg/dL (ref 8–23)
CO2: 23 mmol/L (ref 22–32)
Calcium: 8.9 mg/dL (ref 8.9–10.3)
Chloride: 101 mmol/L (ref 98–111)
Creatinine, Ser: 1.23 mg/dL (ref 0.61–1.24)
GFR, Estimated: >60 mL/min (ref >60)
Glucose, Bld: 147 mg/dL — ABNORMAL HIGH (ref 70–99)
Potassium: 4.4 mmol/L (ref 3.5–5.1)
Sodium: 131 mmol/L — ABNORMAL LOW (ref 135–145)
Total Bilirubin: 0.6 mg/dL (ref 0.3–1.2)
Total Protein: 7.4 g/dL (ref 6.5–8.1)

## 2023-05-18 LAB — SURGICAL PCR SCREEN
MRSA, PCR: NEGATIVE
Staphylococcus aureus: NEGATIVE

## 2023-05-18 LAB — TYPE AND SCREEN
ABO/RH(D): B POS
Antibody Screen: NEGATIVE

## 2023-05-18 LAB — GLUCOSE, CAPILLARY
Glucose-Capillary: 136 mg/dL — ABNORMAL HIGH (ref 70–99)
Glucose-Capillary: 120 mg/dL — ABNORMAL HIGH (ref 70–99)
Glucose-Capillary: 190 mg/dL — ABNORMAL HIGH (ref 70–99)
Glucose-Capillary: 131 mg/dL — ABNORMAL HIGH (ref 70–99)

## 2023-05-18 LAB — HEMOGLOBIN A1C
Hgb A1c MFr Bld: 6.9 % — ABNORMAL HIGH (ref 4.8–5.6)
Mean Plasma Glucose: 151 mg/dL

## 2023-05-18 MED ORDER — LACTATED RINGERS IV SOLN: INTRAVENOUS | Status: DC

## 2023-05-18 MED ORDER — CHLORHEXIDINE GLUCONATE 4 % EX SOLN
60.0000 mL | Freq: Once | CUTANEOUS | Status: AC
  Administered 2023-05-18: 4 via TOPICAL
  Filled 2023-05-18: qty 60

## 2023-05-18 MED ORDER — CEFAZOLIN SODIUM-DEXTROSE 2-4 GM/100ML-% IV SOLN
2.0000 g | Freq: Once | INTRAVENOUS | Status: AC
  Administered 2023-05-19: 2 g via INTRAVENOUS
  Filled 2023-05-18: qty 100

## 2023-05-18 NOTE — Plan of Care (Signed)

## 2023-05-18 NOTE — TOC Initial Note (Signed)
Transition of Care [facility]) - Initial/Assessment Note    Patient Details  Name: Gordon Robinson MRN: [id number] Date of Birth: November 17, 1950  Transition of Care [facility]) CM/SW Contact:    Ronny Bacon, RN Phone Number: 05/18/2023, 1:26 PM  Clinical Narrative:   Patient from home alone. Reports has family in the area but, "they are too busy." Patient does not have any medical equipment at home and has 2 steps to get into home. [facility]. Patient expected to have surgery tomorrow. TOC will continue to follow for any discharge planning needs.               Expected Discharge Plan: Home w Home Health Services Barriers to Discharge: Continued Medical Work up   Patient Goals and CMS Choice Patient states their goals for this hospitalization and ongoing recovery are:: to go home          Expected Discharge Plan and Services   Discharge Planning Services: CM Consult   Living arrangements for the past 2 months: Apartment                                      Prior Living Arrangements/Services Living arrangements for the past 2 months: Apartment Lives with:: Self Patient language and need for interpreter reviewed:: Yes Do you feel safe going back to the place where you live?: Yes      Need for Family Participation in Patient Care: No (Comment) Care giver support system in place?: No (comment)   Criminal Activity/Legal Involvement Pertinent to Current Situation/Hospitalization: No - Comment as needed  Activities of Daily Living Home Assistive Devices/Equipment: None ADL Screening (condition at time of admission) Patient's cognitive ability adequate to safely complete daily activities?: Yes Is the patient deaf or have difficulty hearing?: No Does the patient have difficulty seeing, even when wearing glasses/contacts?: No Does the patient have difficulty concentrating, remembering, or making decisions?: No Patient able to express need for  assistance with ADLs?: Yes Does the patient have difficulty dressing or bathing?: No Independently performs ADLs?: Yes (appropriate for developmental age) Does the patient have difficulty walking or climbing stairs?: No Weakness of Legs: Right Weakness of Arms/Hands: None  Permission Sought/Granted Permission sought to share information with : Case Manager                Emotional Assessment Appearance:: Appears stated age Attitude/Demeanor/Rapport: Engaged Affect (typically observed): Appropriate Orientation: : Oriented to Self, Oriented to Place, Oriented to  Time, Oriented to Situation Alcohol / Substance Use: Not Applicable Psych Involvement: No (comment)  Admission diagnosis:  Acetabulum fracture, right (HCC) [S32.401A] Closed nondisplaced fracture of posterior column of right acetabulum, initial encounter [facility]) [W29.562Z] Patient Active Problem List   Diagnosis Date Noted   Acetabulum fracture, right (HCC) 05/15/2023   Abrasion of right forearm 05/15/2023   Type 2 diabetes mellitus (HCC) 05/15/2023   Acquired hypothyroidism 05/15/2023   Mixed hyperlipidemia 05/15/2023   Tobacco use disorder 05/15/2023   Cigarette smoker 07/29/2022   Solitary pulmonary nodule on lung CT 07/28/2022   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 07/28/2022   Rib fractures 05/24/2022   PCP:  [name], MD Pharmacy:   [facility] [place], [place] - [address] [place] [address] Phone: [phone] Fax: [phone]  [facility] - [place], [place] - [address] [place] [address] Phone: [phone] Fax: [phone]     Social Determinants of Health (SDOH) Social History: SDOH Screenings   Food Insecurity: No Food Insecurity (05/16/2023)  Housing: Low Risk  (05/16/2023)  Transportation Needs: No Transportation Needs (05/16/2023)  Utilities: Not At Risk (05/16/2023)  Tobacco Use: High Risk (05/15/2023)    SDOH Interventions:     Readmission Risk Interventions     No data to display
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
## 2023-05-18 NOTE — Progress Notes (Signed)
     Subjective:  Patient reports pain as mild to moderate.  Pain feels well controlled.  Anticipating surgery with Dr. Carola Frost today for comminuted displaced acetabulum fracture.  Pt has been NPO since midnight.  Denies numbness or tingling of extremity.  Currently bed rest with NWB status.  No other complaints.  Objective:   VITALS:   Vitals:   05/17/23 1440 05/17/23 2015 05/17/23 2045 05/18/23 0508  BP: 123/68 (!) 149/79 132/70 115/67  Pulse: 69 70 78 72  Resp: 18 19 18 18   Temp: 97.8 F (36.6 C) 98 F (36.7 C) 98.5 F (36.9 C) 98.2 F (36.8 C)  TempSrc: Oral Oral    SpO2: 100% 98% 98% 97%  Weight:      Height:        EXAMINATION: AAOX4, sitting comfortably  RLE    TTP right hip, elaborate ROM deferred d/t known fx .                               No knee or ankle effusion             Sens DPN, SPN, TN intact             Motor EHL, ext, flex 5/5             Well perfused No significant edema    Lab Results  Component Value Date   WBC 8.3 05/18/2023   HGB 14.8 05/18/2023   HCT 45.0 05/18/2023   MCV 91.3 05/18/2023   PLT 201 05/18/2023   BMET    Component Value Date/Time   NA 131 (L) 05/18/2023 0440   K 4.4 05/18/2023 0440   CL 101 05/18/2023 0440   CO2 23 05/18/2023 0440   GLUCOSE 147 (H) 05/18/2023 0440   BUN 22 05/18/2023 0440   CREATININE 1.23 05/18/2023 0440   CALCIUM 8.9 05/18/2023 0440   GFRNONAA >60 05/18/2023 0440    Xray: stable imaging  Assessment/Plan:     Principal Problem:   Acetabulum fracture, right (HCC) Active Problems:   Abrasion of right forearm   Type 2 diabetes mellitus (HCC)   Acquired hypothyroidism   Mixed hyperlipidemia   Tobacco use disorder   Plan: Plan is still reduction for fixation to stabilize the comminuted displaced acetabulum fracture with Dr. Carola Frost today pending OR availability.  NPO since midnight.  Continue bedrest and NWB of RLE.   Cecil Cobbs 05/18/2023, 6:11 AM   Weber Cooks,  MD  Contact information:   [phone] 7am-5pm epic message Dr. Blanchie Dessert, or call office for patient follow up: [phone] After hours and holidays please check [email or web address] for group call information for [facility]
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
## 2023-05-18 NOTE — Anesthesia Preprocedure Evaluation (Signed)
Anesthesia Evaluation  Patient identified by MRN, date of birth, ID band Patient awake    Reviewed: Allergy & Precautions, NPO status , Patient's Chart, lab work & pertinent test results  Airway Mallampati: I  TM Distance: >3 FB Neck ROM: Full    Dental  (+) Edentulous Upper, Dental Advisory Given, Partial Lower   Pulmonary COPD, Current Smoker and Patient abstained from smoking.   breath sounds clear to auscultation       Cardiovascular hypertension,  Rhythm:Regular Rate:Normal     Neuro/Psych negative neurological ROS  negative psych ROS   GI/Hepatic negative GI ROS, Neg liver ROS,,,  Endo/Other  diabetesHypothyroidism    Renal/GU negative Renal ROS     Musculoskeletal negative musculoskeletal ROS (+)    Abdominal   Peds  Hematology negative hematology ROS (+)   Anesthesia Other Findings   Reproductive/Obstetrics                             Anesthesia Physical Anesthesia Plan  ASA: 3  Anesthesia Plan: General   Post-op Pain Management: Tylenol PO (pre-op)*, Toradol IV (intra-op)* and Dilaudid IV   Induction: Intravenous  PONV Risk Score and Plan: 2 and Ondansetron and Dexamethasone  Airway Management Planned: Oral ETT  Additional Equipment:   Intra-op Plan:   Post-operative Plan: Extubation in OR  Informed Consent: I have reviewed the patients History and Physical, chart, labs and discussed the procedure including the risks, benefits and alternatives for the proposed anesthesia with the patient or authorized representative who has indicated his/her understanding and acceptance.     Dental advisory given  Plan Discussed with: Anesthesiologist  Anesthesia Plan Comments:        Anesthesia Quick Evaluation

## 2023-05-18 NOTE — Plan of Care (Signed)
  Problem: Education: Goal: Ability to describe self-care measures that may prevent or decrease complications (Diabetes Survival Skills Education) will improve Outcome: Progressing   Problem: Coping: Goal: Ability to adjust to condition or change in health will improve Outcome: Progressing   Problem: Activity: Goal: Risk for activity intolerance will decrease Outcome: Progressing   Problem: Elimination: Goal: Will not experience complications related to bowel motility Outcome: Progressing   Problem: Pain Managment: Goal: General experience of comfort will improve Outcome: Progressing

## 2023-05-19 ENCOUNTER — Inpatient Hospital Stay (HOSPITAL_COMMUNITY): Payer: No Typology Code available for payment source

## 2023-05-19 ENCOUNTER — Encounter (HOSPITAL_COMMUNITY): Payer: Self-pay | Admitting: Internal Medicine

## 2023-05-19 ENCOUNTER — Encounter (HOSPITAL_COMMUNITY)
Admission: EM | Disposition: A | Discharge status: Skilled Nursing Facility | Payer: Self-pay | Source: Home / Self Care | Attending: Student

## 2023-05-19 ENCOUNTER — Other Ambulatory Visit: Payer: Self-pay

## 2023-05-19 ENCOUNTER — Inpatient Hospital Stay (HOSPITAL_COMMUNITY): Payer: No Typology Code available for payment source | Admitting: Anesthesiology

## 2023-05-19 DIAGNOSIS — F17210 Nicotine dependence, cigarettes, uncomplicated: Secondary | ICD-10-CM

## 2023-05-19 DIAGNOSIS — I10 Essential (primary) hypertension: Secondary | ICD-10-CM | POA: Diagnosis not present

## 2023-05-19 DIAGNOSIS — S32401A Unspecified fracture of right acetabulum, initial encounter for closed fracture: Secondary | ICD-10-CM | POA: Diagnosis not present

## 2023-05-19 DIAGNOSIS — J439 Emphysema, unspecified: Secondary | ICD-10-CM

## 2023-05-19 DIAGNOSIS — S32444A Nondisplaced fracture of posterior column [ilioischial] of right acetabulum, initial encounter for closed fracture: Secondary | ICD-10-CM | POA: Diagnosis not present

## 2023-05-19 HISTORY — PX: ORIF ACETABULAR FRACTURE: SHX5029

## 2023-05-19 LAB — COMPREHENSIVE METABOLIC PANEL
ALT: 14 U/L (ref 0–44)
AST: 14 U/L — ABNORMAL LOW (ref 15–41)
Albumin: 3 g/dL — ABNORMAL LOW (ref 3.5–5.0)
Alkaline Phosphatase: 41 U/L (ref 38–126)
Anion gap: 9 (ref 5–15)
BUN: 22 mg/dL (ref 8–23)
CO2: 24 mmol/L (ref 22–32)
Calcium: 8.7 mg/dL — ABNORMAL LOW (ref 8.9–10.3)
Chloride: 100 mmol/L (ref 98–111)
Creatinine, Ser: 1.12 mg/dL (ref 0.61–1.24)
GFR, Estimated: >60 mL/min (ref >60)
Glucose, Bld: 127 mg/dL — ABNORMAL HIGH (ref 70–99)
Potassium: 4.2 mmol/L (ref 3.5–5.1)
Sodium: 133 mmol/L — ABNORMAL LOW (ref 135–145)
Total Bilirubin: 0.4 mg/dL (ref 0.3–1.2)
Total Protein: 7 g/dL (ref 6.5–8.1)

## 2023-05-19 LAB — CBC
HCT: 44.3 % (ref 39.0–52.0)
Hemoglobin: 14.2 g/dL (ref 13.0–17.0)
MCH: 28.7 pg (ref 26.0–34.0)
MCHC: 32.1 g/dL (ref 30.0–36.0)
MCV: 89.5 fL (ref 80.0–100.0)
Platelets: 188 10*3/uL (ref 150–400)
RBC: 4.95 MIL/uL (ref 4.22–5.81)
RDW: 12.4 % (ref 11.5–15.5)
WBC: 6.6 10*3/uL (ref 4.0–10.5)
nRBC: 0 % (ref 0.0–0.2)

## 2023-05-19 LAB — GLUCOSE, CAPILLARY
Glucose-Capillary: 128 mg/dL — ABNORMAL HIGH (ref 70–99)
Glucose-Capillary: 114 mg/dL — ABNORMAL HIGH (ref 70–99)
Glucose-Capillary: 102 mg/dL — ABNORMAL HIGH (ref 70–99)
Glucose-Capillary: 118 mg/dL — ABNORMAL HIGH (ref 70–99)
Glucose-Capillary: 114 mg/dL — ABNORMAL HIGH (ref 70–99)

## 2023-05-19 SURGERY — OPEN REDUCTION INTERNAL FIXATION (ORIF) ACETABULAR FRACTURE
Anesthesia: General | Site: Hip | Laterality: Right

## 2023-05-19 MED ORDER — OXYCODONE HCL 5 MG PO TABS: 5.0000 mg | ORAL_TABLET | Freq: Once | ORAL | Status: DC | PRN

## 2023-05-19 MED ORDER — CEFAZOLIN SODIUM-DEXTROSE 1-4 GM/50ML-% IV SOLN
1.0000 g | Freq: Four times a day (QID) | INTRAVENOUS | Status: AC
  Administered 2023-05-19 – 2023-05-20 (×3): 1 g via INTRAVENOUS
  Filled 2023-05-19 (×3): qty 50

## 2023-05-19 MED ORDER — PHENYLEPHRINE 80 MCG/ML (10ML) SYRINGE FOR IV PUSH (FOR BLOOD PRESSURE SUPPORT)
PREFILLED_SYRINGE | INTRAVENOUS | Status: DC | PRN
  Administered 2023-05-19: 160 ug via INTRAVENOUS

## 2023-05-19 MED ORDER — OXYCODONE HCL 5 MG/5ML PO SOLN: 5.0000 mg | Freq: Once | ORAL | Status: DC | PRN

## 2023-05-19 MED ORDER — ONDANSETRON HCL 4 MG/2ML IJ SOLN: 4.0000 mg | Freq: Four times a day (QID) | INTRAMUSCULAR | Status: DC | PRN

## 2023-05-19 MED ORDER — FENTANYL CITRATE (PF) 250 MCG/5ML IJ SOLN
INTRAMUSCULAR | Status: AC
  Filled 2023-05-19: qty 5

## 2023-05-19 MED ORDER — DEXAMETHASONE SODIUM PHOSPHATE 10 MG/ML IJ SOLN
INTRAMUSCULAR | Status: DC | PRN
  Administered 2023-05-19: 10 mg via INTRAVENOUS

## 2023-05-19 MED ORDER — SUGAMMADEX SODIUM 200 MG/2ML IV SOLN
INTRAVENOUS | Status: DC | PRN
  Administered 2023-05-19: 200 mg via INTRAVENOUS

## 2023-05-19 MED ORDER — CHLORHEXIDINE GLUCONATE 0.12 % MT SOLN
OROMUCOSAL | Status: AC
  Administered 2023-05-19: 15 mL via OROMUCOSAL
  Filled 2023-05-19: qty 15

## 2023-05-19 MED ORDER — FENTANYL CITRATE (PF) 100 MCG/2ML IJ SOLN
25.0000 ug | INTRAMUSCULAR | Status: DC | PRN
  Administered 2023-05-19: 50 ug via INTRAVENOUS

## 2023-05-19 MED ORDER — LIDOCAINE 2% (20 MG/ML) 5 ML SYRINGE
INTRAMUSCULAR | Status: DC | PRN
  Administered 2023-05-19: 60 mg via INTRAVENOUS

## 2023-05-19 MED ORDER — CHLORHEXIDINE GLUCONATE 0.12 % MT SOLN
15.0000 mL | OROMUCOSAL | Status: AC
  Filled 2023-05-19: qty 15

## 2023-05-19 MED ORDER — 0.9 % SODIUM CHLORIDE (POUR BTL) OPTIME
TOPICAL | Status: DC | PRN
  Administered 2023-05-19: 1000 mL

## 2023-05-19 MED ORDER — MORPHINE SULFATE (PF) 2 MG/ML IV SOLN
0.5000 mg | INTRAVENOUS | Status: DC | PRN
  Administered 2023-05-19 – 2023-05-20 (×2): 1 mg via INTRAVENOUS
  Filled 2023-05-19 (×2): qty 1

## 2023-05-19 MED ORDER — CEFAZOLIN SODIUM-DEXTROSE 2-4 GM/100ML-% IV SOLN
2.0000 g | INTRAVENOUS | Status: AC
  Administered 2023-05-19: 2 g via INTRAVENOUS

## 2023-05-19 MED ORDER — FENTANYL CITRATE (PF) 100 MCG/2ML IJ SOLN
INTRAMUSCULAR | Status: AC
  Filled 2023-05-19: qty 2

## 2023-05-19 MED ORDER — TRANEXAMIC ACID-NACL 1000-0.7 MG/100ML-% IV SOLN
1000.0000 mg | INTRAVENOUS | Status: DC
  Filled 2023-05-19: qty 100

## 2023-05-19 MED ORDER — ENOXAPARIN SODIUM 40 MG/0.4ML IJ SOSY
40.0000 mg | PREFILLED_SYRINGE | INTRAMUSCULAR | Status: DC
  Administered 2023-05-20 – 2023-05-22 (×3): 40 mg via SUBCUTANEOUS
  Filled 2023-05-19 (×3): qty 0.4

## 2023-05-19 MED ORDER — ACETAMINOPHEN 10 MG/ML IV SOLN: 1000.0000 mg | Freq: Once | INTRAVENOUS | Status: DC | PRN

## 2023-05-19 MED ORDER — PROPOFOL 10 MG/ML IV BOLUS
INTRAVENOUS | Status: DC | PRN
Start: 2023-05-19 — End: 2023-05-19
  Administered 2023-05-19: 100 mg via INTRAVENOUS

## 2023-05-19 MED ORDER — METOCLOPRAMIDE HCL 5 MG PO TABS: 5.0000 mg | ORAL_TABLET | Freq: Three times a day (TID) | ORAL | Status: DC | PRN

## 2023-05-19 MED ORDER — OXYCODONE-ACETAMINOPHEN 5-325 MG PO TABS
1.0000 | ORAL_TABLET | ORAL | Status: DC | PRN
  Administered 2023-05-19 – 2023-05-24 (×8): 2 via ORAL
  Administered 2023-05-24: 1 via ORAL
  Administered 2023-05-25 – 2023-05-26 (×4): 2 via ORAL
  Filled 2023-05-19 (×3): qty 2
  Filled 2023-05-19: qty 1
  Filled 2023-05-19 (×11): qty 2

## 2023-05-19 MED ORDER — ACETAMINOPHEN 500 MG PO TABS
1000.0000 mg | ORAL_TABLET | Freq: Once | ORAL | Status: AC
  Administered 2023-05-19: 1000 mg via ORAL
  Filled 2023-05-19: qty 2

## 2023-05-19 MED ORDER — FENTANYL CITRATE (PF) 250 MCG/5ML IJ SOLN
INTRAMUSCULAR | Status: DC | PRN
  Administered 2023-05-19: 50 ug via INTRAVENOUS
  Administered 2023-05-19: 100 ug via INTRAVENOUS
  Administered 2023-05-19: 25 ug via INTRAVENOUS
  Administered 2023-05-19: 50 ug via INTRAVENOUS
  Administered 2023-05-19: 25 ug via INTRAVENOUS

## 2023-05-19 MED ORDER — SODIUM CHLORIDE 0.9 % IV SOLN: INTRAVENOUS | Status: DC

## 2023-05-19 MED ORDER — ACETAMINOPHEN 10 MG/ML IV SOLN
INTRAVENOUS | Status: AC
  Filled 2023-05-19: qty 100

## 2023-05-19 MED ORDER — POVIDONE-IODINE 10 % EX SWAB: 2.0000 | Freq: Once | CUTANEOUS | Status: DC

## 2023-05-19 MED ORDER — PROPOFOL 10 MG/ML IV BOLUS
INTRAVENOUS | Status: AC
  Filled 2023-05-19: qty 20

## 2023-05-19 MED ORDER — ONDANSETRON HCL 4 MG PO TABS: 4.0000 mg | ORAL_TABLET | Freq: Four times a day (QID) | ORAL | Status: DC | PRN

## 2023-05-19 MED ORDER — PHENYLEPHRINE HCL-NACL 20-0.9 MG/250ML-% IV SOLN
INTRAVENOUS | Status: DC | PRN
  Administered 2023-05-19: 25 ug/min via INTRAVENOUS

## 2023-05-19 MED ORDER — VANCOMYCIN HCL 1000 MG IV SOLR
INTRAVENOUS | Status: AC
  Filled 2023-05-19: qty 20

## 2023-05-19 MED ORDER — ALBUMIN HUMAN 5 % IV SOLN: INTRAVENOUS | Status: DC | PRN

## 2023-05-19 MED ORDER — ONDANSETRON HCL 4 MG/2ML IJ SOLN
INTRAMUSCULAR | Status: DC | PRN
  Administered 2023-05-19: 4 mg via INTRAVENOUS

## 2023-05-19 MED ORDER — DOCUSATE SODIUM 100 MG PO CAPS
100.0000 mg | ORAL_CAPSULE | Freq: Two times a day (BID) | ORAL | Status: DC
  Administered 2023-05-19 – 2023-05-20 (×3): 100 mg via ORAL
  Filled 2023-05-19 (×3): qty 1

## 2023-05-19 MED ORDER — VANCOMYCIN HCL 1000 MG IV SOLR
INTRAVENOUS | Status: DC | PRN
  Administered 2023-05-19: 1000 mg

## 2023-05-19 MED ORDER — METOCLOPRAMIDE HCL 5 MG/ML IJ SOLN: 5.0000 mg | Freq: Three times a day (TID) | INTRAMUSCULAR | Status: DC | PRN

## 2023-05-19 MED ORDER — AMISULPRIDE (ANTIEMETIC) 5 MG/2ML IV SOLN: 10.0000 mg | Freq: Once | INTRAVENOUS | Status: DC | PRN

## 2023-05-19 MED ORDER — METHOCARBAMOL 500 MG PO TABS
500.0000 mg | ORAL_TABLET | Freq: Three times a day (TID) | ORAL | Status: DC
  Administered 2023-05-19 – 2023-05-26 (×19): 500 mg via ORAL
  Filled 2023-05-19 (×20): qty 1

## 2023-05-19 MED ORDER — LACTATED RINGERS IV SOLN: INTRAVENOUS | Status: DC

## 2023-05-19 MED ORDER — ROCURONIUM BROMIDE 10 MG/ML (PF) SYRINGE
PREFILLED_SYRINGE | INTRAVENOUS | Status: DC | PRN
  Administered 2023-05-19: 20 mg via INTRAVENOUS
  Administered 2023-05-19: 60 mg via INTRAVENOUS

## 2023-05-19 SURGICAL SUPPLY — 77 items
APPLIER CLIP 11 MED OPEN (CLIP) ×1
APR CLP MED 11 20 MLT OPN (CLIP) ×1
BAG COUNTER SPONGE SURGICOUNT (BAG) ×1 IMPLANT
BAG SPNG CNTER NS LX DISP (BAG)
BIT DRILL AO MATTA 2.5MX230M (BIT) IMPLANT
BIT DRILL STEP 3.5 (DRILL) IMPLANT
BLADE CLIPPER SURG (BLADE) IMPLANT
BRUSH SCRUB EZ PLAIN DRY (MISCELLANEOUS) ×2 IMPLANT
CLIP APPLIE 11 MED OPEN (CLIP) IMPLANT
COVER SURGICAL LIGHT HANDLE (MISCELLANEOUS) ×1 IMPLANT
DRAPE C-ARM 42X72 X-RAY (DRAPES) ×1 IMPLANT
DRAPE C-ARMOR (DRAPES) ×1 IMPLANT
DRAPE INCISE IOBAN 66X45 STRL (DRAPES) ×1 IMPLANT
DRAPE INCISE IOBAN 85X60 (DRAPES) ×1 IMPLANT
DRAPE ORTHO SPLIT 77X108 STRL (DRAPES) ×2
DRAPE SURG ORHT 6 SPLT 77X108 (DRAPES) ×2 IMPLANT
DRAPE U-SHAPE 47X51 STRL (DRAPES) ×1 IMPLANT
DRILL BIT AO MATTA 2.5MX230M (BIT) ×1
DRILL STEP 3.5 (DRILL)
DRSG MEPILEX POST OP 4X12 (GAUZE/BANDAGES/DRESSINGS) IMPLANT
DRSG MEPILEX POST OP 4X8 (GAUZE/BANDAGES/DRESSINGS) IMPLANT
ELECT BLADE 6.5 EXT (BLADE) ×1 IMPLANT
ELECT REM PT RETURN 9FT ADLT (ELECTROSURGICAL) ×1
ELECTRODE REM PT RTRN 9FT ADLT (ELECTROSURGICAL) ×1 IMPLANT
GLOVE BIO SURGEON STRL SZ7.5 (GLOVE) ×1 IMPLANT
GLOVE BIO SURGEON STRL SZ8 (GLOVE) ×1 IMPLANT
GLOVE BIOGEL PI IND STRL 7.5 (GLOVE) ×1 IMPLANT
GLOVE BIOGEL PI IND STRL 8 (GLOVE) ×1 IMPLANT
GLOVE SURG ORTHO LTX SZ7.5 (GLOVE) ×2 IMPLANT
GLOVE SURG SS PI 6.5 STRL IVOR (GLOVE) IMPLANT
GOWN STRL REUS W/ TWL LRG LVL3 (GOWN DISPOSABLE) ×2 IMPLANT
GOWN STRL REUS W/ TWL XL LVL3 (GOWN DISPOSABLE) ×2 IMPLANT
GOWN STRL REUS W/TWL LRG LVL3 (GOWN DISPOSABLE) ×2
GOWN STRL REUS W/TWL XL LVL3 (GOWN DISPOSABLE) ×2
HANDPIECE INTERPULSE COAX TIP (DISPOSABLE) ×2
KIT BASIN OR (CUSTOM PROCEDURE TRAY) ×1 IMPLANT
KIT TURNOVER KIT B (KITS) ×1 IMPLANT
MANIFOLD NEPTUNE II (INSTRUMENTS) ×1 IMPLANT
NDL SUT .5 MAYO 1.404X.05X (NEEDLE) IMPLANT
NEEDLE MAYO TAPER (NEEDLE) ×1
NS IRRIG 1000ML POUR BTL (IV SOLUTION) ×1 IMPLANT
PACK TOTAL JOINT (CUSTOM PROCEDURE TRAY) ×1 IMPLANT
PAD ARMBOARD 7.5X6 YLW CONV (MISCELLANEOUS) ×2 IMPLANT
PLATE SUPRAPECTINEAL PELVIS (Plate) IMPLANT
RETRACTOR ONETRAX LX 135X30 (MISCELLANEOUS) IMPLANT
RETRIEVER SUT HEWSON (MISCELLANEOUS) ×1 IMPLANT
SCREW CORT 48X3.5XST NS LF (Screw) IMPLANT
SCREW CORTEX ST MATTA 3.5X30MM (Screw) IMPLANT
SCREW CORTEX ST MATTA 3.5X36MM (Screw) IMPLANT
SCREW CORTEX ST MATTA 3.5X38M (Screw) IMPLANT
SCREW CORTEX ST MATTA 3.5X40MM (Screw) IMPLANT
SCREW CORTICAL 3.5X42MM (Screw) IMPLANT
SCREW CORTICAL 3.5X48MM (Screw) ×1 IMPLANT
SET HNDPC FAN SPRY TIP SCT (DISPOSABLE) ×1 IMPLANT
SPONGE T-LAP 18X18 ~~LOC~~+RFID (SPONGE) IMPLANT
STAPLER VISISTAT 35W (STAPLE) ×1 IMPLANT
STRIP CLOSURE SKIN 1/2X4 (GAUZE/BANDAGES/DRESSINGS) ×1 IMPLANT
SUCTION TUBE FRAZIER 10FR DISP (SUCTIONS) ×1 IMPLANT
SUT ETHILON 2 0 PSLX (SUTURE) ×2 IMPLANT
SUT FIBERWIRE #2 38 T-5 BLUE (SUTURE) ×1
SUT SILK 2 0 (SUTURE) ×1
SUT SILK 2-0 18XBRD TIE 12 (SUTURE) IMPLANT
SUT VIC AB 0 CT1 27 (SUTURE) ×1
SUT VIC AB 0 CT1 27XBRD ANBCTR (SUTURE) ×1 IMPLANT
SUT VIC AB 1 CT1 18XCR BRD 8 (SUTURE) ×1 IMPLANT
SUT VIC AB 1 CT1 27 (SUTURE) ×1
SUT VIC AB 1 CT1 27XBRD ANBCTR (SUTURE) ×1 IMPLANT
SUT VIC AB 1 CT1 36 (SUTURE) IMPLANT
SUT VIC AB 1 CT1 8-18 (SUTURE) ×1
SUT VIC AB 1 CTX 27 (SUTURE) IMPLANT
SUT VIC AB 2-0 CT1 27 (SUTURE) ×1
SUT VIC AB 2-0 CT1 TAPERPNT 27 (SUTURE) ×1 IMPLANT
SUTURE FIBERWR #2 38 T-5 BLUE (SUTURE) ×2 IMPLANT
TOWEL GREEN STERILE (TOWEL DISPOSABLE) ×2 IMPLANT
TOWEL GREEN STERILE FF (TOWEL DISPOSABLE) ×2 IMPLANT
TRAY FOLEY MTR SLVR 16FR STAT (SET/KITS/TRAYS/PACK) IMPLANT
WATER STERILE IRR 1000ML POUR (IV SOLUTION) IMPLANT

## 2023-05-19 NOTE — Progress Notes (Signed)
No changes over night.  The risks and benefits of surgery for right acetabular repair were discussed with the patient, including the possibility of infection, nerve injury, vessel injury, wound breakdown, arthritis, symptomatic hardware, DVT/ PE, loss of motion, malunion, nonunion, and need for further surgery among others.  These risks were acknowledged and consent provided to proceed.  [name], MD [facility], [facility] [phone]

## 2023-05-19 NOTE — Plan of Care (Signed)

## 2023-05-19 NOTE — Plan of Care (Signed)
  Problem: Education: Goal: Knowledge of General Education information will improve Description: Including pain rating scale, medication(s)/side effects and non-pharmacologic comfort measures Outcome: Progressing   Problem: Clinical Measurements: Goal: Ability to maintain clinical measurements within normal limits will improve Outcome: Progressing   Problem: Activity: Goal: Risk for activity intolerance will decrease Outcome: Progressing   Problem: Coping: Goal: Level of anxiety will decrease Outcome: Progressing   Problem: Elimination: Goal: Will not experience complications related to bowel motility Outcome: Progressing   Problem: Pain Managment: Goal: General experience of comfort will improve Outcome: Progressing   

## 2023-05-19 NOTE — Transfer of Care (Signed)
Immediate Anesthesia Transfer of Care Note  Patient: Gordon Robinson  Procedure(s) Performed: OPEN REDUCTION INTERNAL FIXATION (ORIF) ACETABULAR FRACTURE (Right: Hip)  Patient Location: PACU  Anesthesia Type:General  Level of Consciousness: awake, alert , and oriented  Airway & Oxygen Therapy: Patient Spontanous Breathing and Patient connected to nasal cannula oxygen  Post-op Assessment: Report given to RN, Post -op Vital signs reviewed and stable, and Patient moving all extremities  Post vital signs: Reviewed and stable  Last Vitals:  Vitals Value Taken Time  BP 169/79 05/19/23 2002  Temp    Pulse 87 05/19/23 2008  Resp 18 05/19/23 2008  SpO2 99 % 05/19/23 2008  Vitals shown include unfiled device data.  Last Pain:  Vitals:   05/19/23 0752  TempSrc: Oral  PainSc:       Patients Stated Pain Goal: 3 (05/16/23 1950)  Complications: No notable events documented.
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
## 2023-05-19 NOTE — Anesthesia Procedure Notes (Signed)
Procedure Name: Intubation Date/Time: 05/19/2023 5:40 PM  Performed by: Darryl Nestle, CRNAPre-anesthesia Checklist: Patient identified, Emergency Drugs available, Suction available and Patient being monitored Patient Re-evaluated:Patient Re-evaluated prior to induction Oxygen Delivery Method: Circle system utilized Preoxygenation: Pre-oxygenation with 100% oxygen Induction Type: IV induction Ventilation: Mask ventilation without difficulty Laryngoscope Size: Mac and 3 Grade View: Grade I Tube type: Oral Tube size: 7.0 mm Number of attempts: 1 Airway Equipment and Method: Stylet and Oral airway Placement Confirmation: ETT inserted through vocal cords under direct vision, positive ETCO2 and breath sounds checked- equal and bilateral Secured at: 23 cm Tube secured with: Tape Dental Injury: Teeth and Oropharynx as per pre-operative assessment

## 2023-05-20 DIAGNOSIS — F17200 Nicotine dependence, unspecified, uncomplicated: Secondary | ICD-10-CM | POA: Diagnosis not present

## 2023-05-20 DIAGNOSIS — S32401A Unspecified fracture of right acetabulum, initial encounter for closed fracture: Secondary | ICD-10-CM | POA: Diagnosis not present

## 2023-05-20 DIAGNOSIS — S50811A Abrasion of right forearm, initial encounter: Secondary | ICD-10-CM | POA: Diagnosis not present

## 2023-05-20 DIAGNOSIS — E039 Hypothyroidism, unspecified: Secondary | ICD-10-CM | POA: Diagnosis not present

## 2023-05-20 LAB — COMPREHENSIVE METABOLIC PANEL
ALT: 17 U/L (ref 0–44)
AST: 18 U/L (ref 15–41)
Albumin: 3.2 g/dL — ABNORMAL LOW (ref 3.5–5.0)
Alkaline Phosphatase: 40 U/L (ref 38–126)
Anion gap: 12 (ref 5–15)
BUN: 18 mg/dL (ref 8–23)
CO2: 22 mmol/L (ref 22–32)
Calcium: 8.4 mg/dL — ABNORMAL LOW (ref 8.9–10.3)
Chloride: 101 mmol/L (ref 98–111)
Creatinine, Ser: 1.15 mg/dL (ref 0.61–1.24)
GFR, Estimated: >60 mL/min (ref >60)
Glucose, Bld: 145 mg/dL — ABNORMAL HIGH (ref 70–99)
Potassium: 4.6 mmol/L (ref 3.5–5.1)
Sodium: 135 mmol/L (ref 135–145)
Total Bilirubin: 0.7 mg/dL (ref 0.3–1.2)
Total Protein: 7.3 g/dL (ref 6.5–8.1)

## 2023-05-20 LAB — CBC
HCT: 41.7 % (ref 39.0–52.0)
Hemoglobin: 13.5 g/dL (ref 13.0–17.0)
MCH: 29.8 pg (ref 26.0–34.0)
MCHC: 32.4 g/dL (ref 30.0–36.0)
MCV: 92.1 fL (ref 80.0–100.0)
Platelets: 206 10*3/uL (ref 150–400)
RBC: 4.53 MIL/uL (ref 4.22–5.81)
RDW: 12.2 % (ref 11.5–15.5)
WBC: 12.7 10*3/uL — ABNORMAL HIGH (ref 4.0–10.5)
nRBC: 0 % (ref 0.0–0.2)

## 2023-05-20 LAB — GLUCOSE, CAPILLARY
Glucose-Capillary: 126 mg/dL — ABNORMAL HIGH (ref 70–99)
Glucose-Capillary: 121 mg/dL — ABNORMAL HIGH (ref 70–99)
Glucose-Capillary: 210 mg/dL — ABNORMAL HIGH (ref 70–99)
Glucose-Capillary: 123 mg/dL — ABNORMAL HIGH (ref 70–99)

## 2023-05-20 MED ORDER — ALUM & MAG HYDROXIDE-SIMETH 200-200-20 MG/5ML PO SUSP
30.0000 mL | Freq: Four times a day (QID) | ORAL | Status: DC | PRN
  Administered 2023-05-20: 30 mL via ORAL
  Filled 2023-05-20: qty 30

## 2023-05-20 NOTE — Progress Notes (Signed)
PT Cancellation Note  Patient Details Name: Gordon Robinson MRN: [id number] DOB: 10/27/1951   Cancelled Treatment:    Reason Eval/Treat Not Completed: Other (comment)  Orders received and chart reviewed;  Pt politely and adamantly declining PT eval, or any OOB activity at this time;  He tells me he will participate fully tomorrow;   Discussed with OT, who indicates OT has gotten the same response from pt;  Will return tomorrow for PT eval;   Van Clines, PT  Acute Rehabilitation Services Office [phone] Secure Chat welcomed   Levi Aland 05/20/2023, 1:27 PM

## 2023-05-20 NOTE — Plan of Care (Signed)
   Problem: Education: Goal: Ability to describe self-care measures that may prevent or decrease complications (Diabetes Survival Skills Education) will improve Outcome: Progressing   

## 2023-05-20 NOTE — Progress Notes (Signed)
OT Cancellation Note  Patient Details Name: Gordon Robinson MRN: [id number] DOB: 1951/02/08   Cancelled Treatment:    Reason Eval/Treat Not Completed: Patient declined, no reason specified (Pt reports talking to MD earlier, has been stating he does not want to do therapy today and would like to just relax until tomorrow. Pt not open to encouragment to participate. OT to continue to follow-up with pt as able.)  05/20/2023  AB, OTR/L  Acute Rehabilitation Services  Office: [phone]   Tristan Schroeder 05/20/2023, 1:55 PM
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
## 2023-05-20 NOTE — Progress Notes (Signed)
Orthopaedic Trauma Service Progress Note  Patient ID: Gordon Robinson MRN: [id number] DOB/AGE: Jul 26, 1951 72 y.o.  Subjective:  Doing much better Pain tolerable Foley has been removed and he has voided + Flatus Excited for his [employer] for lunch   Has not worked with therapy   ROS As above Objective:   VITALS:   Vitals:   05/19/23 2109 05/20/23 0002 05/20/23 0452 05/20/23 0711  BP: (!) 170/77 (!) 151/80 (!) 154/72 126/73  Pulse: 85 85 85 76  Resp: 18 17 16 18   Temp: 98.5 F (36.9 C) 98.6 F (37 C) 98 F (36.7 C) 98.2 F (36.8 C)  TempSrc: Oral Oral Oral Oral  SpO2: 100% 100% 100% 97%  Weight:      Height:        Estimated body mass index is 20.22 kg/m as calculated from the following:   Height as of this encounter: 5\' 11"  (1.803 m).   Weight as of this encounter: 65.8 kg.   Intake/Output      07/16 0701 07/17 0700 07/17 0701 07/18 0700   P.O. 240 240   I.V. (mL/kg) 1823.8 (27.7) 157 (2.4)   IV Piggyback 250 0   Total Intake(mL/kg) 2313.8 (35.2) 397 (6)   Urine (mL/kg/hr) 2800 (1.8) 300 (1.1)   Emesis/NG output  0   Other  0   Stool  0   Blood 300 0   Total Output 3100 300   Net -786.2 +97        Urine Occurrence  0 x   Stool Occurrence  0 x   Emesis Occurrence  0 x     LABS  Results for orders placed or performed during the hospital encounter of 05/15/23 (from the past 24 hour(s))  Glucose, capillary     Status: Abnormal   Collection Time: 05/19/23  1:06 PM  Result Value Ref Range   Glucose-Capillary 102 (H) 70 - 99 mg/dL  Glucose, capillary     Status: Abnormal   Collection Time: 05/19/23  3:57 PM  Result Value Ref Range   Glucose-Capillary 118 (H) 70 - 99 mg/dL  Glucose, capillary     Status: Abnormal   Collection Time: 05/19/23  8:04 PM  Result Value Ref Range   Glucose-Capillary 114 (H) 70 - 99 mg/dL  Comprehensive metabolic panel     Status: Abnormal   Collection  Time: 05/20/23 12:43 AM  Result Value Ref Range   Sodium 135 135 - 145 mmol/L   Potassium 4.6 3.5 - 5.1 mmol/L   Chloride 101 98 - 111 mmol/L   CO2 22 22 - 32 mmol/L   Glucose, Bld 145 (H) 70 - 99 mg/dL   BUN 18 8 - 23 mg/dL   Creatinine, Ser 4.09 0.61 - 1.24 mg/dL   Calcium 8.4 (L) 8.9 - 10.3 mg/dL   Total Protein 7.3 6.5 - 8.1 g/dL   Albumin 3.2 (L) 3.5 - 5.0 g/dL   AST 18 15 - 41 U/L   ALT 17 0 - 44 U/L   Alkaline Phosphatase 40 38 - 126 U/L   Total Bilirubin 0.7 0.3 - 1.2 mg/dL   GFR, Estimated >81 >19 mL/min   Anion gap 12 5 - 15  CBC     Status: Abnormal   Collection Time: 05/20/23 12:43 AM  Result Value  Ref Range   WBC 12.7 (H) 4.0 - 10.5 K/uL   RBC 4.53 4.22 - 5.81 MIL/uL   Hemoglobin 13.5 13.0 - 17.0 g/dL   HCT 40.9 81.1 - 91.4 %   MCV 92.1 80.0 - 100.0 fL   MCH 29.8 26.0 - 34.0 pg   MCHC 32.4 30.0 - 36.0 g/dL   RDW 78.2 95.6 - 21.3 %   Platelets 206 150 - 400 K/uL   nRBC 0.0 0.0 - 0.2 %  Glucose, capillary     Status: Abnormal   Collection Time: 05/20/23  7:12 AM  Result Value Ref Range   Glucose-Capillary 126 (H) 70 - 99 mg/dL     PHYSICAL EXAM:   Gen: awake, alert, NAD, sitting up in bed, pleasant Lungs: unlabored Cardiac: reg Abd: soft, NTND, + BS Pelvis/ R LEx  Dressing anterior pelvis is clean, dry and intact  Ext warm   Minimal swelling to leg  + DP pulse  DPN, SPN, TN sensation intact  EHL, FHL, lesser toe motor intact  Ankle flexion, extension, inversion and eversion intact   Obturator nv motor and sensory functions intact     Assessment/Plan: 1 Day Post-Op   Principal Problem:   Acetabulum fracture, right (HCC) Active Problems:   Abrasion of right forearm   Type 2 diabetes mellitus (HCC)   Acquired hypothyroidism   Mixed hyperlipidemia   Tobacco use disorder   Anti-infectives (From admission, onward)    Start     Dose/Rate Route Frequency Ordered Stop   05/20/23 0000  ceFAZolin (ANCEF) IVPB 1 g/50 mL premix        1 g 100  mL/hr over 30 Minutes Intravenous Every 6 hours 05/19/23 2107 05/20/23 1759   05/19/23 1920  vancomycin (VANCOCIN) powder  Status:  Discontinued          As needed 05/19/23 2012 05/19/23 2012   05/19/23 1400  ceFAZolin (ANCEF) IVPB 2g/100 mL premix        2 g 200 mL/hr over 30 Minutes Intravenous  Once 05/18/23 1123 05/20/23 0848   05/19/23 0700  ceFAZolin (ANCEF) IVPB 2g/100 mL premix        2 g 200 mL/hr over 30 Minutes Intravenous On call to O.R. 05/19/23 0604 05/19/23 1800     .  POD/HD#: 27  72 year old male with complex right acetabulum fracture  -Right both column acetabular fracture with quadrilateral plate displacement s/p ORIF right acetabulum from intrapelvic approach Weightbearing Touchdown weightbearing right leg with walker or crutches for 6 to 8 weeks with graduated weightbearing thereafter   ROM/Activity   No formal range of motion restrictions.  Activity as tolerated while maintaining weightbearing restrictions   Wound care   Daily wound care starting on 05/21/2023    Mepilex or 4 x 4 gauze and tape   PT and OT evaluations  Ice as needed to hip and pelvis for swelling and pain control  - Pain management:  Multimodal  - ABL anemia/Hemodynamics  Stable  Monitor - Medical issues   Per primary team  - DVT/PE prophylaxis:  Lovenox and SCDs while inpatient  Discharge on DOAC for 30 days  - ID:   Perioperative antibiotics - Metabolic Bone Disease:  Check vitamin D levels  Optimize nutrition - Activity:  As above - FEN/GI prophylaxis/Foley/Lines:  Carb modified diet  - Impediments to fracture healing:  Diabetes  Nicotine dependence  - Dispo:  Therapy evals  Ortho issues addressed  Follow-up with orthopedics in 10 to 14 days  for suture removal and follow-up x-rays    Mearl Latin, PA-C [phone] (C) 05/20/2023, 11:10 AM  [facility] [address] [place] [address] [phone] [phone] (F)    After  5pm and on the weekends please log on to Amion, go to orthopaedics and the look under the [facility] Call for the provider(s) on call. You can also call our office at [phone] and then follow the prompts to be connected to the call team.  Patient ID: Gordon Robinson, male   DOB: 1951-10-31, 72 y.o.   MRN: [id number]

## 2023-05-21 ENCOUNTER — Inpatient Hospital Stay (HOSPITAL_COMMUNITY): Payer: No Typology Code available for payment source

## 2023-05-21 DIAGNOSIS — S50811A Abrasion of right forearm, initial encounter: Secondary | ICD-10-CM | POA: Diagnosis not present

## 2023-05-21 DIAGNOSIS — S32401A Unspecified fracture of right acetabulum, initial encounter for closed fracture: Secondary | ICD-10-CM | POA: Diagnosis not present

## 2023-05-21 DIAGNOSIS — E039 Hypothyroidism, unspecified: Secondary | ICD-10-CM | POA: Diagnosis not present

## 2023-05-21 DIAGNOSIS — F17200 Nicotine dependence, unspecified, uncomplicated: Secondary | ICD-10-CM | POA: Diagnosis not present

## 2023-05-21 LAB — VITAMIN D 25 HYDROXY (VIT D DEFICIENCY, FRACTURES): Vit D, 25-Hydroxy: 27.59 ng/mL — ABNORMAL LOW (ref 30–100)

## 2023-05-21 LAB — CBC
HCT: 40.4 % (ref 39.0–52.0)
Hemoglobin: 13.1 g/dL (ref 13.0–17.0)
MCH: 29 pg (ref 26.0–34.0)
MCHC: 32.4 g/dL (ref 30.0–36.0)
MCV: 89.6 fL (ref 80.0–100.0)
Platelets: 210 10*3/uL (ref 150–400)
RBC: 4.51 MIL/uL (ref 4.22–5.81)
RDW: 12.4 % (ref 11.5–15.5)
WBC: 12.1 10*3/uL — ABNORMAL HIGH (ref 4.0–10.5)
nRBC: 0 % (ref 0.0–0.2)

## 2023-05-21 LAB — GLUCOSE, CAPILLARY
Glucose-Capillary: 97 mg/dL (ref 70–99)
Glucose-Capillary: 110 mg/dL — ABNORMAL HIGH (ref 70–99)
Glucose-Capillary: 142 mg/dL — ABNORMAL HIGH (ref 70–99)
Glucose-Capillary: 165 mg/dL — ABNORMAL HIGH (ref 70–99)

## 2023-05-21 MED ORDER — POLYETHYLENE GLYCOL 3350 17 G PO PACK: 17.0000 g | PACK | Freq: Every day | ORAL | Status: DC

## 2023-05-21 MED ORDER — PANTOPRAZOLE SODIUM 40 MG PO TBEC
40.0000 mg | DELAYED_RELEASE_TABLET | Freq: Every day | ORAL | Status: DC
  Administered 2023-05-21 – 2023-05-26 (×6): 40 mg via ORAL
  Filled 2023-05-21 (×6): qty 1

## 2023-05-21 MED ORDER — POLYETHYLENE GLYCOL 3350 17 G PO PACK: 17.0000 g | PACK | Freq: Two times a day (BID) | ORAL | Status: DC | PRN

## 2023-05-21 MED ORDER — PROSOURCE PLUS PO LIQD
30.0000 mL | Freq: Two times a day (BID) | ORAL | Status: DC
  Administered 2023-05-21 – 2023-05-26 (×9): 30 mL via ORAL
  Filled 2023-05-21 (×10): qty 30

## 2023-05-21 MED ORDER — SODIUM CHLORIDE 0.9 % IV SOLN: 12.5000 mg | Freq: Four times a day (QID) | INTRAVENOUS | Status: DC | PRN

## 2023-05-21 MED ORDER — SENNOSIDES-DOCUSATE SODIUM 8.6-50 MG PO TABS: 1.0000 | ORAL_TABLET | Freq: Two times a day (BID) | ORAL | Status: DC

## 2023-05-21 MED ORDER — VITAMIN D 25 MCG (1000 UNIT) PO TABS
2000.0000 [IU] | ORAL_TABLET | Freq: Two times a day (BID) | ORAL | Status: DC
  Administered 2023-05-21 – 2023-05-26 (×11): 2000 [IU] via ORAL
  Filled 2023-05-21 (×11): qty 2

## 2023-05-21 MED ORDER — SENNOSIDES-DOCUSATE SODIUM 8.6-50 MG PO TABS
2.0000 | ORAL_TABLET | Freq: Two times a day (BID) | ORAL | Status: DC
  Administered 2023-05-21 – 2023-05-26 (×9): 2 via ORAL
  Filled 2023-05-21 (×11): qty 2

## 2023-05-21 MED ORDER — POLYETHYLENE GLYCOL 3350 17 G PO PACK
17.0000 g | PACK | Freq: Two times a day (BID) | ORAL | Status: DC
  Administered 2023-05-21 – 2023-05-26 (×8): 17 g via ORAL
  Filled 2023-05-21 (×9): qty 1

## 2023-05-21 MED ORDER — ADULT MULTIVITAMIN W/MINERALS CH
1.0000 | ORAL_TABLET | Freq: Every day | ORAL | Status: DC
  Administered 2023-05-21 – 2023-05-26 (×6): 1 via ORAL
  Filled 2023-05-21 (×6): qty 1

## 2023-05-21 MED ORDER — VITAMIN D 25 MCG (1000 UNIT) PO TABS: 1000.0000 [IU] | ORAL_TABLET | Freq: Every day | ORAL | Status: DC

## 2023-05-21 MED ORDER — POLYETHYLENE GLYCOL 3350 17 G PO PACK
17.0000 g | PACK | Freq: Every day | ORAL | Status: DC
Start: 2023-05-21 — End: 2023-05-21

## 2023-05-21 MED ORDER — ENSURE ENLIVE PO LIQD
237.0000 mL | Freq: Two times a day (BID) | ORAL | Status: DC
  Administered 2023-05-22 – 2023-05-26 (×7): 237 mL via ORAL

## 2023-05-21 MED ORDER — MAGNESIUM CITRATE PO SOLN
1.0000 | Freq: Once | ORAL | Status: AC
  Administered 2023-05-21: 1 via ORAL
  Filled 2023-05-21: qty 296

## 2023-05-21 NOTE — [place] FL2 (Signed)
[place] MEDICAID FL2 LEVEL OF CARE FORM     IDENTIFICATION  Patient Name: Gordon Robinson Birthdate: 1951/04/25 Sex: male Admission Date (Current Location): 05/15/2023  [facility] and [place] Number:  Producer, television/film/video and Address:  [facility] [place], [address], [place], [place] [address]      Provider Number: [id number]  Attending Physician Name and Address:  Almon Hercules, MD  Relative Name and Phone Number:  Lindel, Marcell   [phone]    Current Level of Care: Hospital Recommended Level of Care: Skilled Nursing Facility Prior Approval Number:    Date Approved/Denied:   PASRR Number: [id number]  Discharge Plan: SNF    Current Diagnoses: Patient Active Problem List   Diagnosis Date Noted   Acetabulum fracture, right (HCC) 05/15/2023   Abrasion of right forearm 05/15/2023   Type 2 diabetes mellitus (HCC) 05/15/2023   Acquired hypothyroidism 05/15/2023   Mixed hyperlipidemia 05/15/2023   Tobacco use disorder 05/15/2023   Cigarette smoker 07/29/2022   Solitary pulmonary nodule on lung CT 07/28/2022   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 07/28/2022   Rib fractures 05/24/2022    Orientation RESPIRATION BLADDER Height & Weight     Self, Time, Situation, Place  Normal Continent Weight: 145 lb (65.8 kg) Height:  5\' 11"  (180.3 cm)  BEHAVIORAL SYMPTOMS/MOOD NEUROLOGICAL BOWEL NUTRITION STATUS      Continent Diet (see discharge summary)  AMBULATORY STATUS COMMUNICATION OF NEEDS Skin   Limited Assist Verbally Surgical wounds                       Personal Care Assistance Level of Assistance  Bathing, Feeding, Dressing Bathing Assistance: Limited assistance Feeding assistance: Independent Dressing Assistance: Limited assistance     Functional Limitations Info  Sight, Hearing, Speech Sight Info: Adequate Hearing Info: Adequate Speech Info: Adequate    SPECIAL CARE FACTORS FREQUENCY  PT (By licensed PT), OT (By  licensed OT)     PT Frequency: 5x week OT Frequency: 5x week            Contractures Contractures Info: Not present    Additional Factors Info  Code Status, Allergies, Insulin Sliding Scale Code Status Info: full Allergies Info: NKA   Insulin Sliding Scale Info: Novolog: see discharge summary       Current Medications (05/21/2023):  This is the current hospital active medication list Current Facility-Administered Medications  Medication Dose Route Frequency Provider Last Rate Last Admin   acetaminophen (TYLENOL) tablet 650 mg  650 mg Oral Q6H PRN Montez Morita, PA-C   650 mg at 05/20/23 4403   Or   acetaminophen (TYLENOL) suppository 650 mg  650 mg Rectal Q6H PRN Montez Morita, PA-C       alum & mag hydroxide-simeth (MAALOX/MYLANTA) 200-200-20 MG/5ML suspension 30 mL  30 mL Oral Q6H PRN Candelaria Stagers T, MD   30 mL at 05/20/23 2028   chlorproMAZINE (THORAZINE) 12.5 mg in sodium chloride 0.9 % 25 mL IVPB  12.5 mg Intravenous Q6H PRN Almon Hercules, MD       cholecalciferol (VITAMIN D3) 25 MCG (1000 UNIT) tablet 2,000 Units  2,000 Units Oral BID Montez Morita, PA-C   2,000 Units at 05/21/23 1008   empagliflozin (JARDIANCE) tablet 25 mg  25 mg Oral Daily Montez Morita, PA-C   25 mg at 05/21/23 1008   enoxaparin (LOVENOX) injection 40 mg  40 mg Subcutaneous Q24H Montez Morita, PA-C   40  mg at 05/21/23 1008   insulin aspart (novoLOG) injection 0-9 Units  0-9 Units Subcutaneous TID WC Montez Morita, PA-C   3 Units at 05/20/23 1733   levothyroxine (SYNTHROID) tablet 137 mcg  137 mcg Oral QAC breakfast Montez Morita, PA-C   137 mcg at 05/21/23 5409   methocarbamol (ROBAXIN) tablet 500 mg  500 mg Oral TID Montez Morita, PA-C   500 mg at 05/21/23 1007   metoCLOPramide (REGLAN) tablet 5-10 mg  5-10 mg Oral Q8H PRN Montez Morita, PA-C       Or   metoCLOPramide (REGLAN) injection 5-10 mg  5-10 mg Intravenous Q8H PRN Montez Morita, PA-C       morphine (PF) 2 MG/ML injection 0.5-1 mg  0.5-1 mg Intravenous Q2H PRN  Montez Morita, PA-C   1 mg at 05/20/23 0859   ondansetron (ZOFRAN) tablet 4 mg  4 mg Oral Q6H PRN Montez Morita, PA-C       Or   ondansetron [facility]) injection 4 mg  4 mg Intravenous Q6H PRN Montez Morita, PA-C       oxyCODONE-acetaminophen (PERCOCET/ROXICET) 5-325 MG per tablet 1-2 tablet  1-2 tablet Oral Q4H PRN Montez Morita, PA-C   2 tablet at 05/20/23 0617   pantoprazole (PROTONIX) EC tablet 40 mg  40 mg Oral Daily Gonfa, Taye T, MD   40 mg at 05/21/23 1016   polyethylene glycol (MIRALAX / GLYCOLAX) packet 17 g  17 g Oral BID Candelaria Stagers T, MD   17 g at 05/21/23 1008   senna-docusate (Senokot-S) tablet 2 tablet  2 tablet Oral BID Candelaria Stagers T, MD   2 tablet at 05/21/23 1007   simvastatin (ZOCOR) tablet 10 mg  10 mg Oral Daily Montez Morita, PA-C   10 mg at 05/21/23 1007     Discharge Medications: Please see discharge summary for a list of discharge medications.  Relevant Imaging Results:  Relevant Lab Results:   Additional Information SSN: [id number]  Lorri Frederick, LCSW
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
## 2023-05-21 NOTE — Progress Notes (Signed)
MD, patient has been complaining of indigestion and gas.  He received some Maalox last night and we did get him up standing at the bedside.  He did a lot of belching and passing of gas at that time.  He was able to get some rest after that however this morning it is starting all over again.  Patient is now requesting a laxative to assist in having a bowel movement.
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
## 2023-05-21 NOTE — Addendum Note (Signed)
Addendum  created 05/21/23 1812 by Shelton Silvas, MD   Attestation recorded in Intraprocedure, Intraprocedure Attestations filed
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
## 2023-05-21 NOTE — Anesthesia Postprocedure Evaluation (Signed)
Anesthesia Post Note  Patient: Gordon Robinson  Procedure(s) Performed: OPEN REDUCTION INTERNAL FIXATION (ORIF) ACETABULAR FRACTURE (Right: Hip)     Patient location during evaluation: PACU Anesthesia Type: General Level of consciousness: awake and alert Pain management: pain level controlled Vital Signs Assessment: post-procedure vital signs reviewed and stable Respiratory status: spontaneous breathing, nonlabored ventilation, respiratory function stable and patient connected to nasal cannula oxygen Cardiovascular status: blood pressure returned to baseline and stable Postop Assessment: no apparent nausea or vomiting Anesthetic complications: no   No notable events documented.  Last Vitals:  Vitals:   05/21/23 0454 05/21/23 0746  BP: 137/76 (!) 142/74  Pulse: 86 91  Resp: 17 18  Temp:  37 C  SpO2: 99% 98%    Last Pain:  Vitals:   05/20/23 2030  TempSrc:   PainSc: 2                  Yuritzi Kamp S

## 2023-05-21 NOTE — Progress Notes (Signed)
Orthopaedic Trauma Service Progress Note  Patient ID: Gordon Robinson MRN: [id number] DOB/AGE: 11-26-1950 72 y.o.  Subjective:  Doing fair Having some GI distress. Started after he ate [employer] yesterday  Ortho issues stable    ROS As above Objective:   VITALS:   Vitals:   05/20/23 1509 05/20/23 1950 05/21/23 0454 05/21/23 0746  BP: 127/65 (!) 149/79 137/76 (!) 142/74  Pulse: 83 93 86 91  Resp: 15 19 17 18   Temp: 98.1 F (36.7 C) 99.3 F (37.4 C)  98.6 F (37 C)  TempSrc: Oral Oral    SpO2: 99% 100% 99% 98%  Weight:      Height:        Estimated body mass index is 20.22 kg/m as calculated from the following:   Height as of this encounter: 5\' 11"  (1.803 m).   Weight as of this encounter: 65.8 kg.   Intake/Output      07/17 0701 07/18 0700 07/18 0701 07/19 0700   P.O. 240    I.V. (mL/kg) 157 (2.4)    IV Piggyback 100    Total Intake(mL/kg) 497 (7.6)    Urine (mL/kg/hr) 500 (0.3)    Emesis/NG output 0    Other 0    Stool 0    Blood 0    Total Output 500    Net -3         Urine Occurrence 0 x    Stool Occurrence 0 x    Emesis Occurrence 0 x      LABS  Results for orders placed or performed during the hospital encounter of 05/15/23 (from the past 24 hour(s))  Glucose, capillary     Status: Abnormal   Collection Time: 05/20/23 11:16 AM  Result Value Ref Range   Glucose-Capillary 121 (H) 70 - 99 mg/dL  Glucose, capillary     Status: Abnormal   Collection Time: 05/20/23  4:21 PM  Result Value Ref Range   Glucose-Capillary 210 (H) 70 - 99 mg/dL  Glucose, capillary     Status: Abnormal   Collection Time: 05/20/23  9:39 PM  Result Value Ref Range   Glucose-Capillary 123 (H) 70 - 99 mg/dL  CBC     Status: Abnormal   Collection Time: 05/21/23  1:19 AM  Result Value Ref Range   WBC 12.1 (H) 4.0 - 10.5 K/uL   RBC 4.51 4.22 - 5.81 MIL/uL   Hemoglobin 13.1 13.0 - 17.0 g/dL   HCT 34.7  42.5 - 95.6 %   MCV 89.6 80.0 - 100.0 fL   MCH 29.0 26.0 - 34.0 pg   MCHC 32.4 30.0 - 36.0 g/dL   RDW 38.7 56.4 - 33.2 %   Platelets 210 150 - 400 K/uL   nRBC 0.0 0.0 - 0.2 %  VITAMIN D 25 Hydroxy (Vit-D Deficiency, Fractures)     Status: Abnormal   Collection Time: 05/21/23  1:19 AM  Result Value Ref Range   Vit D, 25-Hydroxy 27.59 (L) 30 - 100 ng/mL  Glucose, capillary     Status: None   Collection Time: 05/21/23  7:49 AM  Result Value Ref Range   Glucose-Capillary 97 70 - 99 mg/dL     PHYSICAL EXAM:   Gen: awake, alert, NAD, sitting up in bed, pleasant Lungs: unlabored Cardiac: reg Abd: soft, NTND, +  BS Pelvis/ R LEx             Dressing anterior pelvis is clean, dry and intact             Ext warm              Minimal swelling to leg             + DP pulse             DPN, SPN, TN sensation intact             EHL, FHL, lesser toe motor intact             Ankle flexion, extension, inversion and eversion intact              Obturator nv motor and sensory functions intact   Assessment/Plan: 2 Days Post-Op   Principal Problem:   Acetabulum fracture, right (HCC) Active Problems:   Abrasion of right forearm   Type 2 diabetes mellitus (HCC)   Acquired hypothyroidism   Mixed hyperlipidemia   Tobacco use disorder   Anti-infectives (From admission, onward)    Start     Dose/Rate Route Frequency Ordered Stop   05/20/23 0000  ceFAZolin (ANCEF) IVPB 1 g/50 mL premix        1 g 100 mL/hr over 30 Minutes Intravenous Every 6 hours 05/19/23 2107 05/20/23 1444   05/19/23 1920  vancomycin (VANCOCIN) powder  Status:  Discontinued          As needed 05/19/23 2012 05/19/23 2012   05/19/23 1400  ceFAZolin (ANCEF) IVPB 2g/100 mL premix        2 g 200 mL/hr over 30 Minutes Intravenous  Once 05/18/23 1123 05/20/23 0848   05/19/23 0700  ceFAZolin (ANCEF) IVPB 2g/100 mL premix        2 g 200 mL/hr over 30 Minutes Intravenous On call to O.R. 05/19/23 0604 05/19/23 1800      .  POD/HD#: 74  72 year old male with complex right acetabulum fracture   -Right both column acetabular fracture with quadrilateral plate displacement s/p ORIF right acetabulum from intrapelvic approach Weightbearing Touchdown weightbearing right leg with walker or crutches for 6 to 8 weeks with graduated weightbearing thereafter               ROM/Activity                         No formal range of motion restrictions.  Activity as tolerated while maintaining weightbearing restrictions               Wound care                         Daily wound care starting today, dressing changes as needed                                     Mepilex or 4 x 4 gauze and tape               PT and OT             Ice as needed to hip and pelvis for swelling and pain control   - Pain management:             Multimodal   - ABL anemia/Hemodynamics  Stable             Monitor - Medical issues              Per primary team   - DVT/PE prophylaxis:             Lovenox and SCDs while inpatient             Discharge on DOAC for 30 days  - ID:              Perioperative antibiotics - Metabolic Bone Disease:             Vitamin d insufficiency    Supplement               Optimize nutrition - Activity:             As above - FEN/GI prophylaxis/Foley/Lines:             Carb modified diet   - Impediments to fracture healing:             Diabetes             Nicotine dependence   - Dispo:             TOC for SNF   Ortho issues stable     Mearl Latin, PA-C [phone] (C) 05/21/2023, 10:09 AM  [facility] [address] [place] [address] [phone] [phone] (F)    After 5pm and on the weekends please log on to Amion, go to orthopaedics and the look under the [facility] Call for the provider(s) on call. You can also call our office at [phone] and then follow the prompts to be connected to the call team.  Patient ID: Gordon Robinson, male   DOB: [birth year], 72 y.o.   MRN: [id number]

## 2023-05-22 ENCOUNTER — Encounter (HOSPITAL_COMMUNITY): Payer: Self-pay | Admitting: Orthopedic Surgery

## 2023-05-22 ENCOUNTER — Other Ambulatory Visit (HOSPITAL_COMMUNITY): Payer: Self-pay

## 2023-05-22 DIAGNOSIS — F17200 Nicotine dependence, unspecified, uncomplicated: Secondary | ICD-10-CM | POA: Diagnosis not present

## 2023-05-22 DIAGNOSIS — E039 Hypothyroidism, unspecified: Secondary | ICD-10-CM | POA: Diagnosis not present

## 2023-05-22 DIAGNOSIS — S50811A Abrasion of right forearm, initial encounter: Secondary | ICD-10-CM | POA: Diagnosis not present

## 2023-05-22 DIAGNOSIS — S32401A Unspecified fracture of right acetabulum, initial encounter for closed fracture: Secondary | ICD-10-CM | POA: Diagnosis not present

## 2023-05-22 DIAGNOSIS — E43 Unspecified severe protein-calorie malnutrition: Secondary | ICD-10-CM | POA: Insufficient documentation

## 2023-05-22 LAB — RENAL FUNCTION PANEL
Albumin: 3 g/dL — ABNORMAL LOW (ref 3.5–5.0)
Anion gap: 9 (ref 5–15)
BUN: 19 mg/dL (ref 8–23)
CO2: 24 mmol/L (ref 22–32)
Calcium: 8.8 mg/dL — ABNORMAL LOW (ref 8.9–10.3)
Chloride: 101 mmol/L (ref 98–111)
Creatinine, Ser: 0.91 mg/dL (ref 0.61–1.24)
GFR, Estimated: >60 mL/min (ref >60)
Glucose, Bld: 118 mg/dL — ABNORMAL HIGH (ref 70–99)
Phosphorus: 3.4 mg/dL (ref 2.5–4.6)
Potassium: 4.4 mmol/L (ref 3.5–5.1)
Sodium: 134 mmol/L — ABNORMAL LOW (ref 135–145)

## 2023-05-22 LAB — CBC
HCT: 40.7 % (ref 39.0–52.0)
Hemoglobin: 13.2 g/dL (ref 13.0–17.0)
MCH: 29 pg (ref 26.0–34.0)
MCHC: 32.4 g/dL (ref 30.0–36.0)
MCV: 89.5 fL (ref 80.0–100.0)
Platelets: 227 10*3/uL (ref 150–400)
RBC: 4.55 MIL/uL (ref 4.22–5.81)
RDW: 12.5 % (ref 11.5–15.5)
WBC: 9.2 10*3/uL (ref 4.0–10.5)
nRBC: 0 % (ref 0.0–0.2)

## 2023-05-22 LAB — GLUCOSE, CAPILLARY
Glucose-Capillary: 113 mg/dL — ABNORMAL HIGH (ref 70–99)
Glucose-Capillary: 125 mg/dL — ABNORMAL HIGH (ref 70–99)
Glucose-Capillary: 175 mg/dL — ABNORMAL HIGH (ref 70–99)
Glucose-Capillary: 153 mg/dL — ABNORMAL HIGH (ref 70–99)

## 2023-05-22 LAB — MAGNESIUM: Magnesium: 2.4 mg/dL (ref 1.7–2.4)

## 2023-05-22 MED ORDER — NALOXEGOL OXALATE 25 MG PO TABS
25.0000 mg | ORAL_TABLET | Freq: Once | ORAL | Status: AC
  Administered 2023-05-22: 25 mg via ORAL
  Filled 2023-05-22 (×2): qty 1

## 2023-05-22 MED ORDER — APIXABAN 2.5 MG PO TABS
2.5000 mg | ORAL_TABLET | Freq: Two times a day (BID) | ORAL | Status: DC
  Administered 2023-05-22 – 2023-05-26 (×8): 2.5 mg via ORAL
  Filled 2023-05-22 (×8): qty 1

## 2023-05-22 MED ORDER — APIXABAN 2.5 MG PO TABS: 2.5000 mg | ORAL_TABLET | Freq: Two times a day (BID) | ORAL | Status: DC

## 2023-05-22 MED ORDER — APIXABAN 2.5 MG PO TABS
ORAL_TABLET | ORAL | Status: AC
  Filled 2023-05-22: qty 1

## 2023-05-22 MED ORDER — VITAMIN D 125 MCG (5000 UT) PO CAPS: 1.0000 | ORAL_CAPSULE | Freq: Every day | ORAL | 6 refills | Status: DC
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
## 2023-05-22 NOTE — TOC Benefit Eligibility Note (Signed)
Pharmacy Patient Advocate Encounter  Insurance verification completed.    The patient is insured through [facility] Medicare Part D  Ran test claim for Eliquis 2.5 mg and the current 30 day co-pay is $0.00.   This test claim was processed through [facility]- copay amounts may vary at other pharmacies due to pharmacy/plan contracts, or as the patient moves through the different stages of their insurance plan.    Roland Earl, CPHT Pharmacy Patient Advocate Specialist [facility] Patient Advocate Team Direct Number: [phone]  Fax: [phone]

## 2023-05-22 NOTE — Plan of Care (Signed)

## 2023-05-22 NOTE — Progress Notes (Signed)
Pt refused to change Mepilex dressing to anterior surgical site, PA [facility] notified.

## 2023-05-22 NOTE — Progress Notes (Signed)
Orthopaedic Trauma Service Progress Note  Patient ID: Gordon Robinson MRN: [id number] DOB/AGE: Mar 16, 1951 72 y.o.  Subjective:  Ortho issues stable No acute changes    ROS As above  Objective:   VITALS:   Vitals:   05/21/23 0746 05/21/23 1354 05/21/23 1944 05/22/23 0700  BP: (!) 142/74 128/72 131/76 133/77  Pulse: 91 86 87 85  Resp: 18 18 18 18   Temp: 98.6 F (37 C) 98 F (36.7 C) 98.5 F (36.9 C) 98 F (36.7 C)  TempSrc:    Oral  SpO2: 98% 99% 99% 100%  Weight:      Height:        Estimated body mass index is 20.22 kg/m as calculated from the following:   Height as of this encounter: 5\' 11"  (1.803 m).   Weight as of this encounter: 65.8 kg.   Intake/Output      07/18 0701 07/19 0700 07/19 0701 07/20 0700   P.O.     I.V. (mL/kg)     IV Piggyback     Total Intake(mL/kg)     Urine (mL/kg/hr) 200 (0.1) 200 (0.7)   Emesis/NG output     Other     Stool     Blood     Total Output 200 200   Net -200 -200          LABS  Results for orders placed or performed during the hospital encounter of 05/15/23 (from the past 24 hour(s))  Glucose, capillary     Status: Abnormal   Collection Time: 05/21/23 11:15 AM  Result Value Ref Range   Glucose-Capillary 110 (H) 70 - 99 mg/dL  Glucose, capillary     Status: Abnormal   Collection Time: 05/21/23  4:31 PM  Result Value Ref Range   Glucose-Capillary 142 (H) 70 - 99 mg/dL  Glucose, capillary     Status: Abnormal   Collection Time: 05/21/23  7:47 PM  Result Value Ref Range   Glucose-Capillary 165 (H) 70 - 99 mg/dL  Renal function panel     Status: Abnormal   Collection Time: 05/22/23 12:43 AM  Result Value Ref Range   Sodium 134 (L) 135 - 145 mmol/L   Potassium 4.4 3.5 - 5.1 mmol/L   Chloride 101 98 - 111 mmol/L   CO2 24 22 - 32 mmol/L   Glucose, Bld 118 (H) 70 - 99 mg/dL   BUN 19 8 - 23 mg/dL   Creatinine, Ser 6.57 0.61 - 1.24 mg/dL    Calcium 8.8 (L) 8.9 - 10.3 mg/dL   Phosphorus 3.4 2.5 - 4.6 mg/dL   Albumin 3.0 (L) 3.5 - 5.0 g/dL   GFR, Estimated >84 >69 mL/min   Anion gap 9 5 - 15  Magnesium     Status: None   Collection Time: 05/22/23 12:43 AM  Result Value Ref Range   Magnesium 2.4 1.7 - 2.4 mg/dL  CBC     Status: None   Collection Time: 05/22/23 12:43 AM  Result Value Ref Range   WBC 9.2 4.0 - 10.5 K/uL   RBC 4.55 4.22 - 5.81 MIL/uL   Hemoglobin 13.2 13.0 - 17.0 g/dL   HCT 62.9 52.8 - 41.3 %   MCV 89.5 80.0 - 100.0 fL   MCH 29.0 26.0 - 34.0 pg   MCHC 32.4 30.0 -  36.0 g/dL   RDW 16.1 09.6 - 04.5 %   Platelets 227 150 - 400 K/uL   nRBC 0.0 0.0 - 0.2 %  Glucose, capillary     Status: Abnormal   Collection Time: 05/22/23  7:31 AM  Result Value Ref Range   Glucose-Capillary 113 (H) 70 - 99 mg/dL  Glucose, capillary     Status: Abnormal   Collection Time: 05/22/23 11:01 AM  Result Value Ref Range   Glucose-Capillary 125 (H) 70 - 99 mg/dL     PHYSICAL EXAM:   Gen:NAD  Lungs: unlabored Cardiac: reg Abd: soft, NTND, + BS Pelvis/ R LEx             Dressing anterior pelvis is clean, dry and intact             Ext warm              Minimal swelling to leg             + DP pulse             DPN, SPN, TN sensation intact             EHL, FHL, lesser toe motor intact             Ankle flexion, extension, inversion and eversion intact              Obturator nv motor and sensory functions intact   Exam is stable  Assessment/Plan: 3 Days Post-Op      Anti-infectives (From admission, onward)    Start     Dose/Rate Route Frequency Ordered Stop   05/20/23 0000  ceFAZolin (ANCEF) IVPB 1 g/50 mL premix        1 g 100 mL/hr over 30 Minutes Intravenous Every 6 hours 05/19/23 2107 05/20/23 1444   05/19/23 1920  vancomycin (VANCOCIN) powder  Status:  Discontinued          As needed 05/19/23 2012 05/19/23 2012   05/19/23 1400  ceFAZolin (ANCEF) IVPB 2g/100 mL premix        2 g 200 mL/hr over 30 Minutes  Intravenous  Once 05/18/23 1123 05/20/23 0848   05/19/23 0700  ceFAZolin (ANCEF) IVPB 2g/100 mL premix        2 g 200 mL/hr over 30 Minutes Intravenous On call to O.R. 05/19/23 0604 05/19/23 1800     .  POD/HD#: 48    72 year old male with complex right acetabulum fracture   -Right both column acetabular fracture with quadrilateral plate displacement s/p ORIF right acetabulum from intrapelvic approach Weightbearing Touchdown weightbearing right leg with walker or crutches for 6 to 8 weeks with graduated weightbearing thereafter               ROM/Activity                         No formal range of motion restrictions.  Activity as tolerated while maintaining weightbearing restrictions               Wound care                         Daily wound care starting today, dressing changes as needed  Mepilex or 4 x 4 gauze and tape               PT and OT             Ice as needed to hip and pelvis for swelling and pain control   - Pain management:             Multimodal   - ABL anemia/Hemodynamics             Stable             Monitor  - Medical issues              Per primary team   - DVT/PE prophylaxis:            Change to eliquis today   Continue eliquis for 30 days upon dc   - ID:              Perioperative antibiotics  - Metabolic Bone Disease:             Vitamin d insufficiency                          Supplement                Optimize nutrition - Activity:             As above - FEN/GI prophylaxis/Foley/Lines:             Carb modified diet   - Impediments to fracture healing:             Diabetes             Nicotine dependence   - Dispo:             TOC for SNF              Ortho issues stable   Follow up with ortho in 10-14 days for suture removal and xrays. Suture removal around 06/02/2023  Mearl Latin, PA-C [phone] (C) 05/22/2023, 11:06 AM  [facility] [address] [place]  [place] [address] [phone] [phone] (F)    After 5pm and on the weekends please log on to Amion, go to orthopaedics and the look under the [facility] Call for the provider(s) on call. You can also call our office at [phone] and then follow the prompts to be connected to the call team.  Patient ID: Gordon Robinson, male   DOB: [birth year], 72 y.o.   MRN: [id number]
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
## 2023-05-22 NOTE — Progress Notes (Signed)
During the medication administration process, the patient expressed dissatisfaction and complained each time they were asked to take their medications. Despite thorough explanations about the purpose and details of the medications, the patient continued to voice complaints.  While performing care tasks, the patient made comments suggesting that I was being rough with him and made remarks about how I would behave in bed, which were inappropriate. The patient continued to engage in inappropriate conversation, which I addressed with professionalism and attempted to redirect the discussion.

## 2023-05-23 DIAGNOSIS — S32401A Unspecified fracture of right acetabulum, initial encounter for closed fracture: Secondary | ICD-10-CM | POA: Diagnosis not present

## 2023-05-23 DIAGNOSIS — E039 Hypothyroidism, unspecified: Secondary | ICD-10-CM | POA: Diagnosis not present

## 2023-05-23 DIAGNOSIS — F17200 Nicotine dependence, unspecified, uncomplicated: Secondary | ICD-10-CM | POA: Diagnosis not present

## 2023-05-23 DIAGNOSIS — S50811A Abrasion of right forearm, initial encounter: Secondary | ICD-10-CM | POA: Diagnosis not present

## 2023-05-23 LAB — GLUCOSE, CAPILLARY
Glucose-Capillary: 111 mg/dL — ABNORMAL HIGH (ref 70–99)
Glucose-Capillary: 122 mg/dL — ABNORMAL HIGH (ref 70–99)
Glucose-Capillary: 216 mg/dL — ABNORMAL HIGH (ref 70–99)
Glucose-Capillary: 164 mg/dL — ABNORMAL HIGH (ref 70–99)

## 2023-05-23 MED ORDER — NALOXEGOL OXALATE 25 MG PO TABS
25.0000 mg | ORAL_TABLET | Freq: Every day | ORAL | Status: DC
  Administered 2023-05-23 – 2023-05-26 (×3): 25 mg via ORAL
  Filled 2023-05-23 (×4): qty 1

## 2023-05-23 NOTE — Progress Notes (Signed)
MD ordered sopa suds enema.  Patient asked if I could come back after lunch to give.  I returned and the patient had visitors.  He asked me if I could return when they left.  I returned later and he asked if we could do it after dinner.  His brother arrived around dinner time and he asked if we could administer it when he left.  Patient was educated multiple times on the importance of this intervention.  Will relay to night shift.
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
## 2023-05-24 DIAGNOSIS — F17200 Nicotine dependence, unspecified, uncomplicated: Secondary | ICD-10-CM | POA: Diagnosis not present

## 2023-05-24 DIAGNOSIS — S50811A Abrasion of right forearm, initial encounter: Secondary | ICD-10-CM | POA: Diagnosis not present

## 2023-05-24 DIAGNOSIS — E039 Hypothyroidism, unspecified: Secondary | ICD-10-CM | POA: Diagnosis not present

## 2023-05-24 DIAGNOSIS — S32401A Unspecified fracture of right acetabulum, initial encounter for closed fracture: Secondary | ICD-10-CM | POA: Diagnosis not present

## 2023-05-24 LAB — GLUCOSE, CAPILLARY
Glucose-Capillary: 115 mg/dL — ABNORMAL HIGH (ref 70–99)
Glucose-Capillary: 143 mg/dL — ABNORMAL HIGH (ref 70–99)
Glucose-Capillary: 164 mg/dL — ABNORMAL HIGH (ref 70–99)

## 2023-05-24 NOTE — Plan of Care (Signed)
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
## 2023-05-25 DIAGNOSIS — F17200 Nicotine dependence, unspecified, uncomplicated: Secondary | ICD-10-CM | POA: Diagnosis not present

## 2023-05-25 DIAGNOSIS — E039 Hypothyroidism, unspecified: Secondary | ICD-10-CM | POA: Diagnosis not present

## 2023-05-25 DIAGNOSIS — S50811A Abrasion of right forearm, initial encounter: Secondary | ICD-10-CM | POA: Diagnosis not present

## 2023-05-25 DIAGNOSIS — S32401A Unspecified fracture of right acetabulum, initial encounter for closed fracture: Secondary | ICD-10-CM | POA: Diagnosis not present

## 2023-05-25 LAB — GLUCOSE, CAPILLARY
Glucose-Capillary: 134 mg/dL — ABNORMAL HIGH (ref 70–99)
Glucose-Capillary: 100 mg/dL — ABNORMAL HIGH (ref 70–99)
Glucose-Capillary: 145 mg/dL — ABNORMAL HIGH (ref 70–99)
Glucose-Capillary: 186 mg/dL — ABNORMAL HIGH (ref 70–99)

## 2023-05-25 MED ORDER — APIXABAN 2.5 MG PO TABS: 2.5000 mg | ORAL_TABLET | Freq: Two times a day (BID) | ORAL | Status: DC

## 2023-05-25 MED ORDER — SENNOSIDES-DOCUSATE SODIUM 8.6-50 MG PO TABS: 2.0000 | ORAL_TABLET | Freq: Two times a day (BID) | ORAL | Status: DC

## 2023-05-25 MED ORDER — POLYETHYLENE GLYCOL 3350 17 GM/SCOOP PO POWD: 17.0000 g | Freq: Two times a day (BID) | ORAL | Status: AC | PRN

## 2023-05-25 MED ORDER — OXYCODONE HCL 5 MG PO TABS: 5.0000 mg | ORAL_TABLET | Freq: Four times a day (QID) | ORAL | 0 refills | Status: AC | PRN

## 2023-05-25 MED ORDER — PANTOPRAZOLE SODIUM 40 MG PO TBEC: 40.0000 mg | DELAYED_RELEASE_TABLET | Freq: Every day | ORAL | Status: DC

## 2023-05-25 MED ORDER — ACETAMINOPHEN 500 MG PO TABS: ORAL_TABLET | ORAL | Status: AC

## 2023-05-25 MED ORDER — SIMVASTATIN 10 MG PO TABS: 10.0000 mg | ORAL_TABLET | Freq: Every day | ORAL | Status: DC

## 2023-05-25 MED ORDER — POLYVINYL ALCOHOL 1.4 % OP SOLN: 1.0000 [drp] | OPHTHALMIC | Status: DC | PRN
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
## 2023-05-25 NOTE — Plan of Care (Signed)
  Problem: Fluid Volume: Goal: Ability to maintain a balanced intake and output will improve Outcome: Progressing   Problem: Health Behavior/Discharge Planning: Goal: Ability to manage health-related needs will improve Outcome: Progressing   Problem: Nutritional: Goal: Maintenance of adequate nutrition will improve Outcome: Progressing   Problem: Skin Integrity: Goal: Risk for impaired skin integrity will decrease Outcome: Progressing

## 2023-05-25 NOTE — Plan of Care (Signed)

## 2023-05-25 NOTE — TOC Progression Note (Signed)
Transition of Care [facility] - Progression Note    Patient Details  Name: Gordon Robinson MRN: [id number] Date of Birth: 03-17-1951  Transition of Care [facility]) CM/SW Contact  Erin Sons, [place] Phone Number: 05/25/2023, 12:51 PM  Clinical Narrative:     CSW met with pt and provided bed offers. Pt chooses [facility]. [facility] confirmed they could admit pt pending auth.   CSW submitted SNF auth request in [place] portal. [phone]  Auth status: pending  Expected Discharge Plan: Skilled Nursing Facility Barriers to Discharge: Continued Medical Work up, SNF Pending bed offer  Expected Discharge Plan and Services In-house Referral: Clinical Social Work Discharge Planning Services: CM Consult Post Acute Care Choice: Skilled Nursing Facility Living arrangements for the past 2 months: Apartment                                       Social Determinants of Health (SDOH) Interventions SDOH Screenings   Food Insecurity: No Food Insecurity (05/16/2023)  Housing: Low Risk  (05/16/2023)  Transportation Needs: No Transportation Needs (05/16/2023)  Utilities: Not At Risk (05/16/2023)  Tobacco Use: High Risk (05/19/2023)    Readmission Risk Interventions     No data to display
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
## 2023-05-26 ENCOUNTER — Encounter (HOSPITAL_COMMUNITY): Payer: Self-pay | Admitting: Orthopedic Surgery

## 2023-05-26 DIAGNOSIS — E782 Mixed hyperlipidemia: Secondary | ICD-10-CM | POA: Diagnosis not present

## 2023-05-26 DIAGNOSIS — S50811A Abrasion of right forearm, initial encounter: Secondary | ICD-10-CM | POA: Diagnosis not present

## 2023-05-26 DIAGNOSIS — E43 Unspecified severe protein-calorie malnutrition: Secondary | ICD-10-CM | POA: Diagnosis not present

## 2023-05-26 DIAGNOSIS — S32401A Unspecified fracture of right acetabulum, initial encounter for closed fracture: Secondary | ICD-10-CM | POA: Diagnosis not present

## 2023-05-26 LAB — GLUCOSE, CAPILLARY
Glucose-Capillary: 192 mg/dL — ABNORMAL HIGH (ref 70–99)
Glucose-Capillary: 92 mg/dL (ref 70–99)

## 2023-05-26 MED ORDER — LORATADINE 10 MG PO TABS
10.0000 mg | ORAL_TABLET | Freq: Every day | ORAL | Status: DC
  Administered 2023-05-26: 10 mg via ORAL
  Filled 2023-05-26: qty 1

## 2023-05-26 NOTE — TOC Transition Note (Addendum)
Transition of Care [facility]) - CM/SW Discharge Note   Patient Details  Name: Gordon Robinson MRN: [id number] Date of Birth: 01-Feb-1951  Transition of [facility]) CM/SW Contact:  Erin Sons, LCSW Phone Number: 05/26/2023, 12:01 PM   Clinical Narrative:    [facility] approved 7/23-7/25 [id number]   Patient will DC to: [facility] Anticipated DC date: 05/26/23 Transport by: Sharin Mons   Per MD patient ready for DC to [facility]. RN, patient, patient's family, and facility notified of DC. Discharge Summary and FL2 sent to facility. RN to call report prior to discharge [phone] C26-2). DC packet on chart. Ambulance transport requested for patient.   CSW will sign off for now as social work intervention is no longer needed. Please consult [place] again if new needs arise.    Final next level of care: Skilled Nursing Facility Barriers to Discharge: No Barriers Identified   Patient Goals and CMS Choice CMS Medicare.gov Compare Post Acute Care list provided to:: Patient Choice offered to / list presented to : Patient  Discharge Placement                Patient chooses bed at:  ([facility]) Patient to be transferred to facility by: [facility]   Patient and family notified of of transfer: 05/26/23  Discharge Plan and Services Additional resources added to the After Visit Summary for   In-house Referral: Clinical Social Work Discharge Planning Services: CM Consult Post Acute Care Choice: Skilled Nursing Facility                               Social Determinants of Health (SDOH) Interventions SDOH Screenings   Food Insecurity: No Food Insecurity (05/16/2023)  Housing: Low Risk  (05/16/2023)  Transportation Needs: No Transportation Needs (05/16/2023)  Utilities: Not At Risk (05/16/2023)  Tobacco Use: High Risk (05/19/2023)     Readmission Risk Interventions     No data to display
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
## 2023-05-26 NOTE — Progress Notes (Signed)
   05/26/23 1000  Mobility  Activity Ambulated with assistance in hallway  Level of Assistance Minimal assist, patient does 75% or more  Assistive Device Front wheel walker  Distance Ambulated (ft) 150 ft  RLE Weight Bearing TWB  Activity Response Tolerated well  Mobility Referral Yes  $Mobility charge 1 Mobility  Mobility Specialist Start Time (ACUTE ONLY) 1020  Mobility Specialist Stop Time (ACUTE ONLY) 1040  Mobility Specialist Time Calculation (min) (ACUTE ONLY) 20 min   Mobility Specialist: Progress Note  Pt received in bed, agreed to mobility session. ModA throughout using RW with min cues regarding RLE TDW. After session, pt returned to bed with all needs met. Call bell within reach.   Barnie Mort Mobility Specialist Please contact via SecureChat or Rehab office at [phone]
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
## 2023-06-01 ENCOUNTER — Encounter (HOSPITAL_COMMUNITY): Payer: Self-pay | Admitting: Orthopedic Surgery

## 2023-06-03 MED FILL — Fentanyl Citrate Preservative Free (PF) Inj 100 MCG/2ML: INTRAMUSCULAR | Qty: 1 | Status: AC

## 2023-09-04 ENCOUNTER — Encounter: Payer: Self-pay | Admitting: Internal Medicine

## 2023-10-15 NOTE — Progress Notes (Signed)
Updated medical hx 

## 2023-10-18 IMAGING — CT NM PET TUM IMG INITIAL (PI) SKULL BASE T - THIGH
1 of 7 series · 1 of 25 positions shown · non-contrast
Comparison: Low-dose chest CT 09/11/2021. No other comparison
studies.

CLINICAL DATA: Initial treatment strategy for lung cancer. History
of remote chemotherapy and radiation therapy.

EXAM:
NUCLEAR MEDICINE PET SKULL BASE TO THIGH
TECHNIQUE: 7.94 mCi F-18 FDG was injected intravenously. Full-ring PET imaging
was performed from the skull base to thigh after the radiotracer. CT
data was obtained and used for attenuation correction and anatomic
localization.
Fasting blood glucose: 128 mg/dl

[Series 3: ctac · axial · 3.0mm · 0.98mm/px · 1 of 323 slices shown]
[im 323/323  brain]
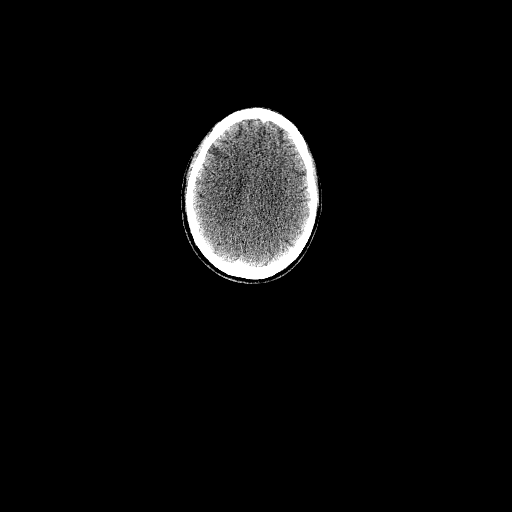

[1 of 25 positions shown; findings below may reference images not displayed]

FINDINGS: Mediastinal blood pool activity: SUV max

NECK:

No hypermetabolic cervical lymph nodes are identified.There are no
lesions of the pharyngeal mucosal space. There is prominent activity
throughout the parotid and submandibular glands bilaterally which
appears homogeneous and diffuse.

Incidental CT findings: Opacified left maxillary sinus without
hypermetabolic activity.

CHEST:

There are no hypermetabolic mediastinal, hilar or axillary lymph
nodes. No hypermetabolic pulmonary activity. Previously demonstrated
right lower lobe pulmonary nodule is not optimally evaluated on the
current study due to breathing artifact, although measures
approximately 10 mm on image 61/9 and appeared solid on previous CT.
This demonstrates no hypermetabolic activity (SUV max 1.5).

Incidental CT findings: Diffuse centrilobular and paraseptal
emphysema as seen on recent chest CT. Atherosclerosis of the aorta,
great vessels and coronary arteries with dense calcifications of the
aortic valve.

ABDOMEN/PELVIS:

There is no hypermetabolic activity within the liver, adrenal
glands, spleen or pancreas. There is no hypermetabolic nodal
activity. Nonspecific wall thickening in the gastric cardia with
associated low level hypermetabolic activity (SUV max 8.1). No other
abnormal focal bowel activity identified.

Incidental CT findings: Multiple calcified gallstones. Diffuse
aortic and branch vessel atherosclerosis. Moderate diverticular
changes throughout the distal colon.

SKELETON:

There is mild focal hypermetabolic activity in the right 2nd rib
near the costovertebral junction (SUV max 3.3). No corresponding
focal abnormality on the CT images. No other significant
hypermetabolic osseous activity.

Incidental CT findings: none
IMPRESSION: 1. The recently reported subpleural nodule in the right lower lobe
is not hypermetabolic. While reassuring, this does not completely
exclude the possibility of malignancy (adenocarcinoma). Recommend
chest CT follow-in 3-6 months.
2. Nonspecific hypermetabolic activity within the proximal stomach,
possibly inflammatory/reactive. Consider further evaluation with
upper GI series or endoscopy, especially if the patient has
dysphagia.
3. Mildly prominent diffuse activity associated with the parotid and
submandibular glands bilaterally, likely physiologic.
4. Single mildly hypermetabolic osseous lesion involving the right
2nd rib near the costovertebral junction without focal corresponding
abnormality on the CT images, indeterminate.
5. Coronary and Aortic Atherosclerosis ([id number]-KOI.I). Calcifications
of the aortic valve.

## 2023-10-20 ENCOUNTER — Ambulatory Visit: Payer: 59 | Attending: Internal Medicine | Admitting: Internal Medicine

## 2023-10-20 NOTE — Progress Notes (Signed)
Erroneous encounter - please disregard.

## 2023-12-07 ENCOUNTER — Ambulatory Visit: Payer: 59 | Admitting: Internal Medicine

## 2023-12-25 ENCOUNTER — Encounter: Payer: Self-pay | Admitting: Internal Medicine

## 2023-12-25 ENCOUNTER — Ambulatory Visit: Payer: 59 | Attending: Internal Medicine | Admitting: Internal Medicine

## 2023-12-25 VITALS — BP 116/76 | HR 75 | Ht 71.0 in | Wt 162.0 lb

## 2023-12-25 DIAGNOSIS — I350 Nonrheumatic aortic (valve) stenosis: Secondary | ICD-10-CM | POA: Insufficient documentation

## 2023-12-25 DIAGNOSIS — I429 Cardiomyopathy, unspecified: Secondary | ICD-10-CM | POA: Insufficient documentation

## 2023-12-25 DIAGNOSIS — I351 Nonrheumatic aortic (valve) insufficiency: Secondary | ICD-10-CM | POA: Insufficient documentation

## 2023-12-25 DIAGNOSIS — I5022 Chronic systolic (congestive) heart failure: Secondary | ICD-10-CM | POA: Diagnosis not present

## 2023-12-25 NOTE — Progress Notes (Signed)
 Cardiology Office Note  Date: 12/25/2023   ID: Gordon Robinson, DOB January 14, 1951, MRN [id number]  PCP:  [name], MD  Cardiologist:  Marjo Bicker, MD Electrophysiologist:  None   History of Present Illness: Gordon Robinson is a 73 y.o. male known to have HTN, DM 2, chronic systolic heart failure was referred to cardiology clinic for evaluation of the same.  I reviewed the echocardiogram from October 2024 that was done at the PCPs office that showed LVEF 40% (LV was not well-visualized, could be mild to moderately decreased), RV is not well-visualized, mild to moderate aortic regurgitation and moderate aortic stenosis however severe aortic valve stenosis cannot be completely excluded.  Patient is a Cytogeneticist.  [facility] started to develop DOE and dizziness since September 2024, hence underwent echocardiogram in October 2024.  He continues to have similar symptoms.  He reported that he gets short of breath when he overexerts but this is a new change since September 2024.  He gets dizzy with exertion.  No syncope.  No angina, palpitations, leg swelling.  Past Medical History:  Diagnosis Date   Aortic atherosclerosis (HCC)    Chronic kidney disease    Chronic systolic heart failure (HCC)    COPD (chronic obstructive pulmonary disease) (HCC)    Diabetes mellitus    Hypertension    Thyroid disease     Past Surgical History:  Procedure Laterality Date   LIVER BIOPSY     ORIF ACETABULAR FRACTURE Right 05/19/2023   Procedure: OPEN REDUCTION INTERNAL FIXATION (ORIF) ACETABULAR FRACTURE;  Surgeon: [name], MD;  Location: [facility];  Service: Orthopedics;  Laterality: Right;    Current Outpatient Medications  Medication Sig Dispense Refill   ALPRAZolam (XANAX) 1 MG tablet Take 1 mg by mouth at bedtime as needed.     apixaban (ELIQUIS) 2.5 MG TABS tablet Take 1 tablet (2.5 mg total) by mouth 2 (two) times daily.     Cholecalciferol (VITAMIN D) 125 MCG (5000 UT) CAPS Take 1  capsule by mouth daily. 30 capsule 6   diclofenac Sodium (VOLTAREN) 1 % GEL Apply 2 g topically 3 (three) times daily.     empagliflozin (JARDIANCE) 25 MG TABS tablet Take 25 mg by mouth daily.     fluticasone (FLONASE) 50 MCG/ACT nasal spray Place 2 sprays into both nostrils daily.     levothyroxine (SYNTHROID) 137 MCG tablet Take 137 mcg by mouth daily before breakfast.     Multiple Vitamin (MULTIVITAMIN) tablet Take 1 tablet by mouth daily.     pantoprazole (PROTONIX) 40 MG tablet Take 1 tablet (40 mg total) by mouth daily.     polyethylene glycol powder (MIRALAX) 17 GM/SCOOP powder Take 17 g by mouth 2 (two) times daily as needed for moderate constipation.     senna-docusate (SENOKOT-S) 8.6-50 MG tablet Take 2 tablets by mouth 2 (two) times daily.     simvastatin (ZOCOR) 10 MG tablet Take 1 tablet (10 mg total) by mouth daily.     tamsulosin (FLOMAX) 0.4 MG CAPS capsule Take 0.4 mg by mouth at bedtime as needed (Prostate).     No current facility-administered medications for this visit.   Allergies:  Patient has no known allergies.   Social History: The patient  reports that he has been smoking cigarettes. He has never used smokeless tobacco. He reports current alcohol use. He reports current drug use. Drug: Cocaine.   Family History: The patient's family history is not on file.   ROS:  Please see the history of present illness. Otherwise, complete review of systems is positive for none.  All other systems are reviewed and negative.   Physical Exam: VS:  BP 116/76   Ht 5\' 11"  (1.803 m)   Wt 162 lb (73.5 kg)   SpO2 97%   BMI 22.59 kg/m , BMI Body mass index is 22.59 kg/m.  Wt Readings from Last 3 Encounters:  12/25/23 162 lb (73.5 kg)  05/19/23 145 lb (65.8 kg)  11/05/22 149 lb 9.6 oz (67.9 kg)    General: Patient appears comfortable at rest. HEENT: Conjunctiva and lids normal, oropharynx clear with moist mucosa. Neck: Supple, no elevated JVP or carotid bruits, no  thyromegaly. Lungs: Clear to auscultation, nonlabored breathing at rest. Cardiac: Regular rate and rhythm, ESM, S2 present Abdomen: Soft, nontender, no hepatomegaly, bowel sounds present, no guarding or rebound. Extremities: No pitting edema, distal pulses 2+. Skin: Warm and dry. Musculoskeletal: No kyphosis. Neuropsychiatric: Alert and oriented x3, affect grossly appropriate.  Recent Labwork: 05/20/2023: ALT 17; AST 18 05/22/2023: BUN 19; Creatinine, Ser 0.91; Hemoglobin 13.2; Magnesium 2.4; Platelets 227; Potassium 4.4; Sodium 134  No results found for: "CHOL", "TRIG", "HDL", "CHOLHDL", "VLDL", "LDLCALC", "LDLDIRECT"   Assessment and Plan:  Chronic systolic heart failure, compensated Cardiomyopathy LVEF 40%, unclear etiology Moderate aortic stenosis, cannot exclude severe aortic stenosis Moderate aortic regurgitation Type II DM 2, follows with PCP.  -Ongoing symptoms of DOE and exertional dizziness since September 2024.  He underwent echocardiogram in October 2022 that showed LVEF 40% (mild to moderately reduced, LV not well-visualized), RV not well-visualized, mild to moderate aortic regurgitation, moderate aortic valve stenosis but severe aortic valve stenosis cannot be completely excluded. Will repeat echocardiogram. He probably will need LHC and RHC after echocardiogram is resulted.  Patient is aware of this plan and agreed to this.  Unable to start any GDMT now due to soft blood pressures.  Will wait on the repeat echocardiogram. -Currently on Eliquis 2.5 mg twice daily after hip surgery.  Medication Adjustments/Labs and Tests Ordered: Current medicines are reviewed at length with the patient today.  Concerns regarding medicines are outlined above.    Disposition:  Follow up 3 months  Signed, Edessa Jakubowicz Verne Spurr, MD, 12/25/2023 1:54 PM    [facility] at [facility] [address] [place], [place] [address]

## 2023-12-25 NOTE — Patient Instructions (Signed)
 Medication Instructions:  Your physician recommends that you continue on your current medications as directed. Please refer to the Current Medication list given to you today.  *If you need a refill on your cardiac medications before your next appointment, please call your pharmacy*   Lab Work: None If you have labs (blood work) drawn today and your tests are completely normal, you will receive your results only by: MyChart Message (if you have MyChart) OR A paper copy in the mail If you have any lab test that is abnormal or we need to change your treatment, we will call you to review the results.   Testing/Procedures: Your physician has requested that you have an echocardiogram. Echocardiography is a painless test that uses sound waves to create images of your heart. It provides your doctor with information about the size and shape of your heart and how well your heart's chambers and valves are working. This procedure takes approximately one hour. There are no restrictions for this procedure. Please do NOT wear cologne, perfume, aftershave, or lotions (deodorant is allowed). Please arrive 15 minutes prior to your appointment time.  Please note: We ask at that you not bring children with you during ultrasound (echo/ vascular) testing. Due to room size and safety concerns, children are not allowed in the ultrasound rooms during exams. Our front office staff cannot provide observation of children in our lobby area while testing is being conducted. An adult accompanying a patient to their appointment will only be allowed in the ultrasound room at the discretion of the ultrasound technician under special circumstances. We apologize for any inconvenience.    Follow-Up: At [facility], you and your health needs are our priority.  As part of our continuing mission to provide you with exceptional heart care, we have created designated Provider Care Teams.  These Care Teams include your  primary Cardiologist (physician) and Advanced Practice Providers (APPs -  Physician Assistants and Nurse Practitioners) who all work together to provide you with the care you need, when you need it.  We recommend signing up for the patient portal called "MyChart".  Sign up information is provided on this After Visit Summary.  MyChart is used to connect with patients for Virtual Visits (Telemedicine).  Patients are able to view lab/test results, encounter notes, upcoming appointments, etc.  Non-urgent messages can be sent to your provider as well.   To learn more about what you can do with MyChart, go to [email or web address].    Your next appointment:   3 month(s)  Provider:   You may see Vishnu P Mallipeddi, MD or one of the following Advanced Practice Providers on your designated Care Team:   [place], PA-C  Jacolyn Reedy, [place]     Other Instructions

## 2024-01-07 ENCOUNTER — Ambulatory Visit (HOSPITAL_COMMUNITY)
Admission: RE | Admit: 2024-01-07 | Discharge: 2024-01-07 | Disposition: A | Discharge status: Home / Self Care | Payer: 59 | Source: Ambulatory Visit | Attending: Internal Medicine | Admitting: Internal Medicine

## 2024-01-07 DIAGNOSIS — I428 Other cardiomyopathies: Secondary | ICD-10-CM | POA: Diagnosis not present

## 2024-01-07 DIAGNOSIS — I350 Nonrheumatic aortic (valve) stenosis: Secondary | ICD-10-CM | POA: Diagnosis present

## 2024-01-07 DIAGNOSIS — I429 Cardiomyopathy, unspecified: Secondary | ICD-10-CM | POA: Diagnosis present

## 2024-01-07 LAB — ECHOCARDIOGRAM COMPLETE
AR max vel: 0.75 cm2
AV Area VTI: 0.78 cm2
AV Area mean vel: 0.66 cm2
AV Mean grad: 40 mmHg
AV Peak grad: 59.9 mmHg
Ao pk vel: 3.87 m/s
Area-P 1/2: 3.1 cm2
Calc EF: 39.8 %
P 1/2 time: 381 ms
S' Lateral: 4.2 cm
Single Plane A2C EF: 39.9 %
Single Plane A4C EF: 45.6 %

## 2024-01-07 NOTE — Progress Notes (Signed)
*  PRELIMINARY RESULTS* Echocardiogram 2D Echocardiogram has been performed.  Stacey Drain 01/07/2024, 10:41 AM

## 2024-01-11 ENCOUNTER — Ambulatory Visit: Payer: 59 | Admitting: Internal Medicine

## 2024-01-13 ENCOUNTER — Ambulatory Visit: Admitting: Internal Medicine

## 2024-04-06 ENCOUNTER — Telehealth: Payer: Self-pay | Admitting: Internal Medicine

## 2024-04-06 ENCOUNTER — Other Ambulatory Visit: Payer: Self-pay

## 2024-04-06 ENCOUNTER — Ambulatory Visit: Payer: 59 | Attending: Internal Medicine | Admitting: Internal Medicine

## 2024-04-06 ENCOUNTER — Encounter: Payer: Self-pay | Admitting: Internal Medicine

## 2024-04-06 VITALS — BP 118/76 | HR 91 | Ht 71.0 in | Wt 176.2 lb

## 2024-04-06 DIAGNOSIS — I429 Cardiomyopathy, unspecified: Secondary | ICD-10-CM | POA: Diagnosis not present

## 2024-04-06 DIAGNOSIS — I350 Nonrheumatic aortic (valve) stenosis: Secondary | ICD-10-CM | POA: Diagnosis not present

## 2024-04-06 DIAGNOSIS — I351 Nonrheumatic aortic (valve) insufficiency: Secondary | ICD-10-CM | POA: Diagnosis not present

## 2024-04-06 DIAGNOSIS — I5022 Chronic systolic (congestive) heart failure: Secondary | ICD-10-CM | POA: Diagnosis not present

## 2024-04-06 NOTE — Progress Notes (Addendum)
 Cardiology Office Note  Date: 04/06/2024   ID: Gordon Robinson, DOB [birth year], MRN [id number]  PCP:  Morgan Arab, MD  Cardiologist:  Lasalle Pointer, MD Electrophysiologist:  None   History of Present Illness: Gordon Robinson is a 73 y.o. male known to have HTN, DM 2, chronic systolic heart failure is here for follow-up visit.  I reviewed the echocardiogram from October 2024 that was done at the PCPs office that showed LVEF 40% (LV was not well-visualized, could be mild to moderately decreased), RV is not well-visualized, mild to moderate aortic regurgitation and moderate aortic stenosis however severe aortic valve stenosis cannot be completely excluded. Repeat echocardiogram at [facility] in March 2025 showed LVEF 40 to 45%, G1 DD with elevated LVEDP, normal RV function, moderate AR, severe aortic valve stenosis and CVP 3 mmHg.  He was supposed be scheduled for LHC and RHC but [facility]. he is here today for follow-up visit accompanied by his girlfriend.  He was admitted at [facility] in [place] for exertional dizziness, chest pain and SOB.  Echocardiogram was performed that showed LVEF 50 to 55% and severe aortic valve stenosis.  He underwent exercise Myoview that showed no evidence of inducible ischemia.  Community care referral was placed (by [facility] medical team) to structural heart clinic at [facility].  He has been symptomatic since September 2024 with DOE and dizziness.  No syncope.  No leg swelling.  No palpitations either.  Occasional chest pains.  He reported that he also underwent LHC at the [facility] but I do not have the report to review.  Past Medical History:  Diagnosis Date   Aortic atherosclerosis (HCC)    Chronic kidney disease    Chronic systolic heart failure (HCC)    COPD (chronic obstructive pulmonary disease) (HCC)    Diabetes mellitus    Hypertension    Thyroid disease     Past Surgical History:  Procedure  Laterality Date   LIVER BIOPSY     ORIF ACETABULAR FRACTURE Right 05/19/2023   Procedure: OPEN REDUCTION INTERNAL FIXATION (ORIF) ACETABULAR FRACTURE;  Surgeon: Hardy Lia, MD;  Location: [facility] OR;  Service: Orthopedics;  Laterality: Right;    Current Outpatient Medications  Medication Sig Dispense Refill   Cholecalciferol  (VITAMIN D ) 125 MCG (5000 UT) CAPS Take 1 capsule by mouth daily. 30 capsule 6   diclofenac Sodium (VOLTAREN) 1 % GEL Apply 2 g topically 3 (three) times daily.     empagliflozin  (JARDIANCE ) 25 MG TABS tablet Take 25 mg by mouth daily.     fluticasone (FLONASE) 50 MCG/ACT nasal spray Place 2 sprays into both nostrils daily.     levothyroxine  (SYNTHROID ) 137 MCG tablet Take 137 mcg by mouth daily before breakfast.     Multiple Vitamin (MULTIVITAMIN) tablet Take 1 tablet by mouth daily.     polyethylene glycol powder (MIRALAX ) 17 GM/SCOOP powder Take 17 g by mouth 2 (two) times daily as needed for moderate constipation.     simvastatin  (ZOCOR ) 10 MG tablet Take 1 tablet (10 mg total) by mouth daily.     tamsulosin (FLOMAX) 0.4 MG CAPS capsule Take 0.4 mg by mouth at bedtime as needed (Prostate).     apixaban  (ELIQUIS ) 2.5 MG TABS tablet Take 1 tablet (2.5 mg total) by mouth 2 (two) times daily.     No current facility-administered medications for this visit.   Allergies:  Patient has no known allergies.   Social History: The  patient  reports that he quit smoking about 4 weeks ago. His smoking use included cigarettes. He started smoking about 55 years ago. He has a 27.7 pack-year smoking history. He has never used smokeless tobacco. He reports current alcohol  use. He reports current drug use. Drug: Cocaine.   Family History: The patient's family history is not on file.   ROS:  Please see the history of present illness. Otherwise, complete review of systems is positive for none.  All other systems are reviewed and negative.   Physical Exam: VS:  BP 118/76 (BP Location:  Left Arm, Patient Position: Sitting)   Pulse 91   Ht 5\' 11"  (1.803 m)   Wt 176 lb 3.2 oz (79.9 kg)   SpO2 94%   BMI 24.57 kg/m , BMI Body mass index is 24.57 kg/m.  Wt Readings from Last 3 Encounters:  04/06/24 176 lb 3.2 oz (79.9 kg)  12/25/23 162 lb (73.5 kg)  05/19/23 145 lb (65.8 kg)    General: Patient appears comfortable at rest. HEENT: Conjunctiva and lids normal, oropharynx clear with moist mucosa. Neck: Supple, no elevated JVP or carotid bruits, no thyromegaly. Lungs: Clear to auscultation, nonlabored breathing at rest. Cardiac: Regular rate and rhythm, ESM, S2 present Abdomen: Soft, nontender, no hepatomegaly, bowel sounds present, no guarding or rebound. Extremities: No pitting edema, distal pulses 2+. Skin: Warm and dry. Musculoskeletal: No kyphosis. Neuropsychiatric: Alert and oriented x3, affect grossly appropriate.  Recent Labwork: 05/20/2023: ALT 17; AST 18 05/22/2023: BUN 19; Creatinine, Ser 0.91; Hemoglobin 13.2; Magnesium  2.4; Platelets 227; Potassium 4.4; Sodium 134  No results found for: "CHOL", "TRIG", "HDL", "CHOLHDL", "VLDL", "LDLCALC", "LDLDIRECT"   Assessment and Plan:  Severe aortic valve stenosis, symptomatic: Ongoing symptoms of DOE, dizziness and occasional chest pain since September 2024.  Echocardiogram at his PCPs office in October 2024 revealed LVEF 40 to 45%, moderate AR and severe aortic valve stenosis. Repeat echocardiogram at [facility] in March 2025 revealed similar findings. However repeat echocardiogram performed at [facility] in [place] showed LVEF 50 to 55% and severe aortic valve stenosis. He underwent exercise Myoview at [facility] recently that showed no evidence of inducible myocardial ischemia.  He also reported that he underwent LHC but I do not have the report.  [facility].  Patient updated after the clinic visit that he will want me to refer him to  structural heart clinic in [place]. He continues to be symptomatic.  Will reach out to [place] to confirm LHC and obtain LHC report if he underwent.  In the meantime, will refer to structural heart clinic in [place].  Chronic systolic heart failure, compensated: He underwent multiple echocardiograms in the last 1 year, LVEF 40 to 45% at his PCPs office and at [facility].  But echo at the [facility] showed LVEF of 50 to 55%, I do not have images to review.  Blood pressure soft to add any GDMT.  Moderate aortic regurgitation: Similar plan as severe AS.  Type II DM 2: Follows with PCP.  On Jardiance  25 mg once daily.  Postop hip care: Underwent hip replacement surgery after which he has been taking Eliquis  2.5 mg twice daily, currently expired.   45 minutes spent in reviewing the medical records and speaking with the patient.  Answered all the questions.  8 minutes spent in documentation.  Medication Adjustments/Labs and Tests Ordered: Current medicines are reviewed at length with  the patient today.  Concerns regarding medicines are outlined above.    Disposition:  Follow up 3 months  Signed, Shereece Wellborn Beauford Bounds, MD, 04/06/2024 9:33 AM    [facility] at [facility] [address] [place], [place] [address]

## 2024-04-06 NOTE — Telephone Encounter (Signed)
 Patient is requesting to sch a procedure. The patient was confused and was unsure of the name of the procedure. Patient was seen this morning by Dr. Mallipeddi. Please advise.

## 2024-04-06 NOTE — Telephone Encounter (Signed)
 Spoke to patient who wanted to be referred to Structural heart for severe AS, symptomatic. Advised by provider that this was appropriate and referral placed.   Patient notified and verbalized understanding.

## 2024-04-06 NOTE — Patient Instructions (Signed)
 Medication Instructions:  Your physician recommends that you continue on your current medications as directed. Please refer to the Current Medication list given to you today.  *If you need a refill on your cardiac medications before your next appointment, please call your pharmacy*  Lab Work: None If you have labs (blood work) drawn today and your tests are completely normal, you will receive your results only by: MyChart Message (if you have MyChart) OR A paper copy in the mail If you have any lab test that is abnormal or we need to change your treatment, we will call you to review the results.  Testing/Procedures: None  Follow-Up: At [facility], you and your health needs are our priority.  As part of our continuing mission to provide you with exceptional heart care, our providers are all part of one team.  This team includes your primary Cardiologist (physician) and Advanced Practice Providers or APPs (Physician Assistants and Nurse Practitioners) who all work together to provide you with the care you need, when you need it.  Your next appointment:    Follow up as needed.   Provider:   You may see Vishnu P Mallipeddi, MD or one of the following Advanced Practice Providers on your designated Care Team:   [place], PA-C  Scotesia Leary, [place] Jacolyn Reedy, [place]     We recommend signing up for the patient portal called "MyChart".  Sign up information is provided on this After Visit Summary.  MyChart is used to connect with patients for Virtual Visits (Telemedicine).  Patients are able to view lab/test results, encounter notes, upcoming appointments, etc.  Non-urgent messages can be sent to your provider as well.   To learn more about what you can do with MyChart, go to [email or web address].   Other Instructions

## 2024-04-07 ENCOUNTER — Telehealth: Payer: Self-pay | Admitting: Internal Medicine

## 2024-04-07 NOTE — Telephone Encounter (Signed)
 Spoke to [place] at [facility] and informed him that pt was referred to Structural Heart Clinic by provider who would determine if pt needed a LHC. Matt voiced understanding and had no further questions or concerns at this time.     Spoke to patient and re-iterated that he has been referred to Structural Heart Clinic and they would reach out for an appointment.

## 2024-04-07 NOTE — Telephone Encounter (Signed)
 Matt from [place] calling to see where Cath was going to be done and who would be doing it. Pt seemed confused and overwhelmed. Requesting cb to discuss

## 2024-05-01 NOTE — H&P (View-Only) (Signed)
 Cardiology Office Note:    Date:  05/02/2024   ID:  [name], DOB [birth year], MRN [id number]  PCP:  Jess Llano, MD   [facility] Providers Cardiologist:  Diannah SHAUNNA Maywood, MD     Referring MD: Jess Llano, MD   Chief Complaint  Patient presents with   Aortic Stenosis    History of Present Illness:    Gordon Robinson is a 73 y.o. male presenting for TAVR evaluation, referred by Dr. Mallipeddi. He reports that he was first told of a heart murmur in the past few years. He remembers being told of his heart 'skipping a beat' but doesn't have a long history of known aortic stenosis. No hx of rheumatic fever. No family hx of aortic valve disease or aneurysm.   The patient is here with his brother today.  The majority of his care has been administered through the [facility].  His background medical problems include hypertension, type 2 diabetes, and chronic HFrEF.  His most recent echocardiogram in our health system from March 2025 shows moderately reduced LV function with an LVEF of 40 to 45%, normal RV function, moderate aortic regurgitation, and findings consistent with severe aortic stenosis with a mean transvalvular gradient of 40 mmHg and a calculated aortic valve area of 0.78 cm.  Dimensionless index 0.24.  It appears the patient became symptomatic in 2024 when he was admitted to the [facility] hospital with exertional dyspnea, dizziness, and chest pain.  I was able to find a nuclear scan done through the [facility] system that showed a gated LVEF of 27% but no inducible ischemia.  He has no hx of undergoing cardiac catheterization.   He admits to exertional dyspnea and chest tightness at times. States that he was admitted to the [facility] earlier this year for those symptoms. He has done ok since then and he reports that he is no longer walking for exercise. He occasionally has chest tightness when walking to the mailbox. He has lightheadedness and dizziness at times, but no syncope. No orthopnea,  PND, or leg swelling.   He has a history of tobacco use and cocaine use.  He has gone through treatment at the [facility] and has not smoked or used cocaine in over 3 months now.  He reports no current problems with his teeth.  He has full upper dentures and partial lower.  Past Medical History:  Diagnosis Date   Aortic atherosclerosis (HCC)    Chronic kidney disease    Chronic systolic heart failure (HCC)    COPD (chronic obstructive pulmonary disease) (HCC)    Diabetes mellitus    Hypertension    Thyroid disease     Past Surgical History:  Procedure Laterality Date   LIVER BIOPSY     ORIF ACETABULAR FRACTURE Right 05/19/2023   Procedure: OPEN REDUCTION INTERNAL FIXATION (ORIF) ACETABULAR FRACTURE;  Surgeon: Celena Sharper, MD;  Location: [facility] OR;  Service: Orthopedics;  Laterality: Right;    Current Medications: Current Meds  Medication Sig   apixaban  (ELIQUIS ) 2.5 MG TABS tablet Take 1 tablet (2.5 mg total) by mouth 2 (two) times daily.   Cholecalciferol  (VITAMIN D ) 125 MCG (5000 UT) CAPS Take 1 capsule by mouth daily.   diclofenac Sodium (VOLTAREN) 1 % GEL Apply 2 g topically 3 (three) times daily.   empagliflozin  (JARDIANCE ) 25 MG TABS tablet Take 25 mg by mouth daily.   fluticasone (FLONASE) 50 MCG/ACT nasal spray Place 2 sprays into both nostrils daily.   levothyroxine  (SYNTHROID )  137 MCG tablet Take 137 mcg by mouth daily before breakfast.   Multiple Vitamin (MULTIVITAMIN) tablet Take 1 tablet by mouth daily.   polyethylene glycol powder (MIRALAX ) 17 GM/SCOOP powder Take 17 g by mouth 2 (two) times daily as needed for moderate constipation.   simvastatin  (ZOCOR ) 10 MG tablet Take 1 tablet (10 mg total) by mouth daily.   tamsulosin (FLOMAX) 0.4 MG CAPS capsule Take 0.4 mg by mouth at bedtime as needed (Prostate).     Allergies:   Patient has no known allergies.   Social History   Socioeconomic History   Marital status: Single    Spouse name: Not on file   Number of  children: Not on file   Years of education: Not on file   Highest education level: Not on file  Occupational History   Not on file  Tobacco Use   Smoking status: Former    Current packs/day: 0.00    Average packs/day: 0.5 packs/day for 55.3 years (27.7 ttl pk-yrs)    Types: Cigarettes    Start date: 47    Quit date: 03/03/2024    Years since quitting: 0.1   Smokeless tobacco: Never  Vaping Use   Vaping status: Never Used  Substance and Sexual Activity   Alcohol  use: Yes    Comment: occassinally   Drug use: Yes    Types: Cocaine   Sexual activity: Not on file  Other Topics Concern   Not on file  Social History Narrative   Not on file   Social Drivers of Health   Financial Resource Strain: Not on file  Food Insecurity: No Food Insecurity (05/16/2023)   Hunger Vital Sign    Worried About Running Out of Food in the Last Year: Never true    Ran Out of Food in the Last Year: Never true  Transportation Needs: No Transportation Needs (05/16/2023)   PRAPARE - Administrator, Civil Service (Medical): No    Lack of Transportation (Non-Medical): No  Physical Activity: Not on file  Stress: Not on file  Social Connections: Not on file     Family History: The patient's family history is not on file.  ROS:   Please see the history of present illness.    All other systems reviewed and are negative.  EKGs/Labs/Other Studies Reviewed:    The following studies were reviewed today: Cardiac Studies & Procedures   ______________________________________________________________________________________________     ECHOCARDIOGRAM  ECHOCARDIOGRAM COMPLETE 01/07/2024  Narrative ECHOCARDIOGRAM REPORT    Patient Name:   Gordon Robinson Date of Exam: 01/07/2024 Medical Rec #:  [id number]   Height:       72.0 in Accession #:    [id number]  Weight:       162.0 lb Date of Birth:  11-Sep-1951    BSA:          1.928 m Patient Age:    73 years    BP:           131/79 mmHg Patient  Gender: M           HR:           71 bpm. Exam Location:  [name]  Procedure: 2D Echo, Cardiac Doppler, Color Doppler, Strain Analysis and 3D Echo (Both Spectral and Color Flow Doppler were utilized during procedure).  Indications:    Cardiomyopathy [facility]) [255145] Severe aortic stenosis [612435]  History:        Patient has no prior history of Echocardiogram examinations.  Cardiomyopathy and CHF, COPD, Aortic Valve Disease, Signs/Symptoms:Murmur; Risk Factors:Current Smoker, Dyslipidemia, Diabetes and Hypertension.  Sonographer:    [name] Referring Phys: [id number] VISHNU P MALLIPEDDI   Sonographer Comments: Global longitudinal strain was attempted. IMPRESSIONS   1. Left ventricular ejection fraction, by estimation, is 40 to 45%. Left ventricular ejection fraction by 3D volume is 42 %. The left ventricle has mildly decreased function. The left ventricle demonstrates global hypokinesis. There is moderate left ventricular hypertrophy. Left ventricular diastolic parameters are consistent with Grade I diastolic dysfunction (impaired relaxation). Elevated left ventricular end-diastolic pressure. The average left ventricular global longitudinal strain is -11.2 %. The global longitudinal strain is abnormal. 2. Right ventricular systolic function is normal. The right ventricular size is normal. There is normal pulmonary artery systolic pressure. 3. The mitral valve is normal in structure. Trivial mitral valve regurgitation. No evidence of mitral stenosis. 4. The aortic valve is tricuspid. There is severe calcifcation of the aortic valve. Aortic valve regurgitation is moderate. Severe aortic valve stenosis. Aortic regurgitation PHT measures 381 msec. Aortic valve area, by VTI measures 0.78 cm. Aortic valve mean gradient measures 40.0 mmHg. Aortic valve Vmax measures 3.87 m/s. DVI is 0.24. 5. The inferior vena cava is normal in size with greater than 50% respiratory variability,  suggesting right atrial pressure of 3 mmHg.  Comparison(s): No prior Echocardiogram.  FINDINGS Left Ventricle: Left ventricular ejection fraction, by estimation, is 40 to 45%. Left ventricular ejection fraction by 3D volume is 42 %. The left ventricle has mildly decreased function. The left ventricle demonstrates global hypokinesis. The average left ventricular global longitudinal strain is -11.2 %. Strain was performed and the global longitudinal strain is abnormal. The left ventricular internal cavity size was normal in size. There is moderate left ventricular hypertrophy. Left ventricular diastolic parameters are consistent with Grade I diastolic dysfunction (impaired relaxation). Elevated left ventricular end-diastolic pressure.  Right Ventricle: The right ventricular size is normal. No increase in right ventricular wall thickness. Right ventricular systolic function is normal. There is normal pulmonary artery systolic pressure. The tricuspid regurgitant velocity is 2.73 m/s, and with an assumed right atrial pressure of 3 mmHg, the estimated right ventricular systolic pressure is 32.8 mmHg.  Left Atrium: Left atrial size was normal in size.  Right Atrium: Right atrial size was normal in size.  Pericardium: There is no evidence of pericardial effusion.  Mitral Valve: The mitral valve is normal in structure. Trivial mitral valve regurgitation. No evidence of mitral valve stenosis.  Tricuspid Valve: The tricuspid valve is normal in structure. Tricuspid valve regurgitation is trivial. No evidence of tricuspid stenosis.  Aortic Valve: The aortic valve is tricuspid. There is severe calcifcation of the aortic valve. Aortic valve regurgitation is moderate. Aortic regurgitation PHT measures 381 msec. Severe aortic stenosis is present. Aortic valve mean gradient measures 40.0 mmHg. Aortic valve peak gradient measures 59.9 mmHg. Aortic valve area, by VTI measures 0.78 cm.  Pulmonic Valve: The  pulmonic valve was normal in structure. Pulmonic valve regurgitation is not visualized. No evidence of pulmonic stenosis.  Aorta: The aortic root is normal in size and structure.  Venous: The inferior vena cava is normal in size with greater than 50% respiratory variability, suggesting right atrial pressure of 3 mmHg.  IAS/Shunts: No atrial level shunt detected by color flow Doppler.  Additional Comments: 3D was performed not requiring image post processing on an independent workstation and was abnormal.   LEFT VENTRICLE PLAX 2D LVIDd:  5.20 cm         Diastology LVIDs:         4.20 cm         LV e' medial:    5.77 cm/s LV PW:         1.50 cm         LV E/e' medial:  20.0 LV IVS:        1.20 cm         LV e' lateral:   5.82 cm/s LVOT diam:     2.00 cm         LV E/e' lateral: 19.8 LV SV:         73 LV SV Index:   38              2D Longitudinal LVOT Area:     3.14 cm        Strain 2D Strain GLS   -11.2 % Avg: LV Volumes (MOD) LV vol d, MOD    123.0 ml      3D Volume EF A2C:                           LV 3D EF:    Left LV vol d, MOD    158.0 ml                   ventricul A4C:                                        ar LV vol s, MOD    73.9 ml                    ejection A2C:                                        fraction LV vol s, MOD    85.9 ml                    by 3D A4C:                                        volume is LV SV MOD A2C:   49.1 ml                    42 %. LV SV MOD A4C:   158.0 ml LV SV MOD BP:    56.1 ml 3D Volume EF: 3D EF:        42 % LV EDV:       259 ml LV ESV:       149 ml LV SV:        110 ml  RIGHT VENTRICLE RV S prime:     11.00 cm/s TAPSE (M-mode): 2.1 cm  LEFT ATRIUM             Index        RIGHT ATRIUM           Index LA diam:        4.00 cm 2.07 cm/m   RA Area:     17.60 cm LA Vol (A2C):  66.6 ml 34.54 ml/m  RA Volume:   50.50 ml  26.19 ml/m LA Vol (A4C):   51.9 ml 26.92 ml/m LA Biplane Vol: 63.5 ml 32.94 ml/m AORTIC  VALVE AV Area (Vmax):    0.75 cm AV Area (Vmean):   0.66 cm AV Area (VTI):     0.78 cm AV Vmax:           387.00 cm/s AV Vmean:          298.000 cm/s AV VTI:            0.931 m AV Peak Grad:      59.9 mmHg AV Mean Grad:      40.0 mmHg LVOT Vmax:         92.40 cm/s LVOT Vmean:        63.000 cm/s LVOT VTI:          0.232 m LVOT/AV VTI ratio: 0.25 AI PHT:            381 msec  AORTA Ao Root diam: 3.50 cm  MITRAL VALVE                TRICUSPID VALVE MV Area (PHT): 3.10 cm     TR Peak grad:   29.8 mmHg MV Decel Time: 245 msec     TR Vmax:        273.00 cm/s MV E velocity: 115.50 cm/s MV A velocity: 98.75 cm/s   SHUNTS MV E/A ratio:  1.17         Systemic VTI:  0.23 m Systemic Diam: 2.00 cm  Vishnu Priya Mallipeddi Electronically signed by Diannah Late Mallipeddi Signature Date/Time: 01/07/2024/2:34:38 PM    Final          ______________________________________________________________________________________________           Recent Labs: 05/20/2023: ALT 17 05/22/2023: BUN 19; Creatinine, Ser 0.91; Hemoglobin 13.2; Magnesium  2.4; Platelets 227; Potassium 4.4; Sodium 134  Recent Lipid Panel No results found for: CHOL, TRIG, HDL, CHOLHDL, VLDL, LDLCALC, LDLDIRECT   Risk Assessment/Calculations:                Physical Exam:    VS:  BP 124/64 (BP Location: Right Arm, Patient Position: Sitting, Cuff Size: Normal)   Pulse 88   Ht 5' 11 (1.803 m)   Wt 180 lb (81.6 kg)   SpO2 95%   BMI 25.10 kg/m     Wt Readings from Last 3 Encounters:  05/02/24 180 lb (81.6 kg)  04/06/24 176 lb 3.2 oz (79.9 kg)  12/25/23 162 lb (73.5 kg)     GEN:  Well nourished, well developed in no acute distress HEENT: Normal NECK: No JVD; No carotid bruits LYMPHATICS: No lymphadenopathy CARDIAC: RRR, 3/6 crescendo decrescendo murmur at the right upper sternal border RESPIRATORY:  Clear to auscultation without rales, wheezing or rhonchi  ABDOMEN: Soft, non-tender,  non-distended MUSCULOSKELETAL:  No edema; No deformity  SKIN: Warm and dry NEUROLOGIC:  Alert and oriented x 3 PSYCHIATRIC:  Normal affect   ASSESSMENT:    1. Severe aortic valve stenosis    PLAN:    In order of problems listed above:  The patient appears to suffer from severe, stage D1 aortic stenosis with chronic combined systolic and diastolic heart failure, NYHA functional class 2 limitation.  He experiences both exertional angina and dyspnea.  I personally reviewed his echo study in our system from earlier this spring.He appears to have a tricuspid valve with severe restriction and calcification of the  leaflets.  LVEF is 40 to 45%, mean gradient is 40 mmHg, DI 0.24, and aortic valve area 0.78 cm.  All of these findings are consistent with severe aortic stenosis.  I have reviewed the natural history of aortic stenosis with the patient and their family members who are present today. We have discussed the limitations of medical therapy and the poor prognosis associated with symptomatic aortic stenosis. We have reviewed potential treatment options, including palliative medical therapy, conventional surgical aortic valve replacement, and transcatheter aortic valve replacement. We discussed treatment options in the context of the patient's specific comorbid medical conditions.  The patient would like to proceed with intervention.  He will need to undergo right and left heart catheterization as part of his preop workup.  He understands that he may have significant CAD, especially in light of his anginal symptoms. I have reviewed the risks, indications, and alternatives to cardiac catheterization, possible angioplasty, and stenting with the patient. Risks include but are not limited to bleeding, infection, vascular injury, stroke, myocardial infection, arrhythmia, kidney injury, radiation-related injury in the case of prolonged fluoroscopy use, emergency cardiac surgery, and death. The patient  understands the risks of serious complication is 1-2 in 1000 with diagnostic cardiac cath and 1-2% or less with angioplasty/stenting.  He will undergo CTA studies of the heart as well as the chest, abdomen, and pelvis to assess for anatomic feasibility of TAVR.  Once his studies are completed, his case will be reviewed by our multidisciplinary heart valve team and he will be referred for formal cardiac surgical consultation as part of a multidisciplinary approach to his care.  In review of his medications, he remains on apixaban  after hip replacement surgery last year.  He has no other indication for this, so it will be discontinued and he will start aspirin 81 mg daily.  Otherwise, no changes were made in his medical regimen today.      Informed Consent   Shared Decision Making/Informed Consent The risks [stroke (1 in 1000), death (1 in 1000), kidney failure [usually temporary] (1 in 500), bleeding (1 in 200), allergic reaction [possibly serious] (1 in 200)], benefits (diagnostic support and management of coronary artery disease) and alternatives of a cardiac catheterization were discussed in detail with Mr. Voorhies and he is willing to proceed.       Medication Adjustments/Labs and Tests Ordered: Current medicines are reviewed at length with the patient today.  Concerns regarding medicines are outlined above.  No orders of the defined types were placed in this encounter.  No orders of the defined types were placed in this encounter.   There are no Patient Instructions on file for this visit.   Signed, [name], MD  05/02/2024 8:18 AM    [facility]

## 2024-05-01 NOTE — Progress Notes (Unsigned)
 Cardiology Office Note:    Date:  05/02/2024   ID:  Gordon Robinson, DOB [birth year], MRN [id number]  PCP:  Gordon Llano, MD   [facility] Providers Cardiologist:  Gordon SHAUNNA Maywood, MD     Referring MD: Gordon Llano, MD   Chief Complaint  Patient presents with   Aortic Stenosis    History of Present Illness:    Gordon Robinson is a 72 y.o. male presenting for TAVR evaluation, referred by Dr. Mallipeddi. He reports that he was first told of a heart murmur in the past few years. He remembers being told of his heart 'skipping a beat' but doesn't have a long history of known aortic stenosis. No hx of rheumatic fever. No family hx of aortic valve disease or aneurysm.   The patient is here with his brother today.  The majority of his care has been administered through the [facility].  His background medical problems include hypertension, type 2 diabetes, and chronic HFrEF.  His most recent echocardiogram in our health system from March 2025 shows moderately reduced LV function with an LVEF of 40 to 45%, normal RV function, moderate aortic regurgitation, and findings consistent with severe aortic stenosis with a mean transvalvular gradient of 40 mmHg and a calculated aortic valve area of 0.78 cm.  Dimensionless index 0.24.  It appears the patient became symptomatic in 2024 when he was admitted to the [facility] hospital with exertional dyspnea, dizziness, and chest pain.  I was able to find a nuclear scan done through the [facility] system that showed a gated LVEF of 27% but no inducible ischemia.  He has no hx of undergoing cardiac catheterization.   He admits to exertional dyspnea and chest tightness at times. States that he was admitted to the [facility] earlier this year for those symptoms. He has done ok since then and he reports that he is no longer walking for exercise. He occasionally has chest tightness when walking to the mailbox. He has lightheadedness and dizziness at times, but no syncope. No orthopnea,  PND, or leg swelling.   He has a history of tobacco use and cocaine use.  He has gone through treatment at the [facility] and has not smoked or used cocaine in over 3 months now.  He reports no current problems with his teeth.  He has full upper dentures and partial lower.  Past Medical History:  Diagnosis Date   Aortic atherosclerosis (HCC)    Chronic kidney disease    Chronic systolic heart failure (HCC)    COPD (chronic obstructive pulmonary disease) (HCC)    Diabetes mellitus    Hypertension    Thyroid disease     Past Surgical History:  Procedure Laterality Date   LIVER BIOPSY     ORIF ACETABULAR FRACTURE Right 05/19/2023   Procedure: OPEN REDUCTION INTERNAL FIXATION (ORIF) ACETABULAR FRACTURE;  Surgeon: Gordon Sharper, MD;  Location: [facility] OR;  Service: Orthopedics;  Laterality: Right;    Current Medications: Current Meds  Medication Sig   apixaban  (ELIQUIS ) 2.5 MG TABS tablet Take 1 tablet (2.5 mg total) by mouth 2 (two) times daily.   Cholecalciferol  (VITAMIN D ) 125 MCG (5000 UT) CAPS Take 1 capsule by mouth daily.   diclofenac Sodium (VOLTAREN) 1 % GEL Apply 2 g topically 3 (three) times daily.   empagliflozin  (JARDIANCE ) 25 MG TABS tablet Take 25 mg by mouth daily.   fluticasone (FLONASE) 50 MCG/ACT nasal spray Place 2 sprays into both nostrils daily.   levothyroxine  (SYNTHROID )  137 MCG tablet Take 137 mcg by mouth daily before breakfast.   Multiple Vitamin (MULTIVITAMIN) tablet Take 1 tablet by mouth daily.   polyethylene glycol powder (MIRALAX ) 17 GM/SCOOP powder Take 17 g by mouth 2 (two) times daily as needed for moderate constipation.   simvastatin  (ZOCOR ) 10 MG tablet Take 1 tablet (10 mg total) by mouth daily.   tamsulosin (FLOMAX) 0.4 MG CAPS capsule Take 0.4 mg by mouth at bedtime as needed (Prostate).     Allergies:   Patient has no known allergies.   Social History   Socioeconomic History   Marital status: Single    Spouse name: Not on file   Number of  children: Not on file   Years of education: Not on file   Highest education level: Not on file  Occupational History   Not on file  Tobacco Use   Smoking status: Former    Current packs/day: 0.00    Average packs/day: 0.5 packs/day for 55.3 years (27.7 ttl pk-yrs)    Types: Cigarettes    Start date: 47    Quit date: 03/03/2024    Years since quitting: 0.1   Smokeless tobacco: Never  Vaping Use   Vaping status: Never Used  Substance and Sexual Activity   Alcohol  use: Yes    Comment: occassinally   Drug use: Yes    Types: Cocaine   Sexual activity: Not on file  Other Topics Concern   Not on file  Social History Narrative   Not on file   Social Drivers of Health   Financial Resource Strain: Not on file  Food Insecurity: No Food Insecurity (05/16/2023)   Hunger Vital Sign    Worried About Running Out of Food in the Last Year: Never true    Ran Out of Food in the Last Year: Never true  Transportation Needs: No Transportation Needs (05/16/2023)   PRAPARE - Administrator, Civil Service (Medical): No    Lack of Transportation (Non-Medical): No  Physical Activity: Not on file  Stress: Not on file  Social Connections: Not on file     Family History: The patient's family history is not on file.  ROS:   Please see the history of present illness.    All other systems reviewed and are negative.  EKGs/Labs/Other Studies Reviewed:    The following studies were reviewed today: Cardiac Studies & Procedures   ______________________________________________________________________________________________     ECHOCARDIOGRAM  ECHOCARDIOGRAM COMPLETE 01/07/2024  Narrative ECHOCARDIOGRAM REPORT    Patient Name:   Gordon Robinson Date of Exam: 01/07/2024 Medical Rec #:  [id number]   Height:       71.0 in Accession #:    [id number]  Weight:       162.0 lb Date of Birth:  11-Sep-1951    BSA:          1.928 m Patient Age:    72 years    BP:           131/79 mmHg Patient  Gender: M           HR:           71 bpm. Exam Location:  Gordon Robinson  Procedure: 2D Echo, Cardiac Doppler, Color Doppler, Strain Analysis and 3D Echo (Both Spectral and Color Flow Doppler were utilized during procedure).  Indications:    Cardiomyopathy [facility]) [[id number]] Severe aortic stenosis [612435]  History:        Patient has no prior history of Echocardiogram examinations.  Cardiomyopathy and CHF, COPD, Aortic Valve Disease, Signs/Symptoms:Murmur; Risk Factors:Current Smoker, Dyslipidemia, Diabetes and Hypertension.  Sonographer:    [name] Referring Phys: [id number] VISHNU P Gordon Robinson   Sonographer Comments: Global longitudinal strain was attempted. IMPRESSIONS   1. Left ventricular ejection fraction, by estimation, is 40 to 45%. Left ventricular ejection fraction by 3D volume is 42 %. The left ventricle has mildly decreased function. The left ventricle demonstrates global hypokinesis. There is moderate left ventricular hypertrophy. Left ventricular diastolic parameters are consistent with Grade I diastolic dysfunction (impaired relaxation). Elevated left ventricular end-diastolic pressure. The average left ventricular global longitudinal strain is -11.2 %. The global longitudinal strain is abnormal. 2. Right ventricular systolic function is normal. The right ventricular size is normal. There is normal pulmonary artery systolic pressure. 3. The mitral valve is normal in structure. Trivial mitral valve regurgitation. No evidence of mitral stenosis. 4. The aortic valve is tricuspid. There is severe calcifcation of the aortic valve. Aortic valve regurgitation is moderate. Severe aortic valve stenosis. Aortic regurgitation PHT measures 381 msec. Aortic valve area, by VTI measures 0.78 cm. Aortic valve mean gradient measures 40.0 mmHg. Aortic valve Vmax measures 3.87 m/s. DVI is 0.24. 5. The inferior vena cava is normal in size with greater than 50% respiratory variability,  suggesting right atrial pressure of 3 mmHg.  Comparison(s): No prior Echocardiogram.  FINDINGS Left Ventricle: Left ventricular ejection fraction, by estimation, is 40 to 45%. Left ventricular ejection fraction by 3D volume is 42 %. The left ventricle has mildly decreased function. The left ventricle demonstrates global hypokinesis. The average left ventricular global longitudinal strain is -11.2 %. Strain was performed and the global longitudinal strain is abnormal. The left ventricular internal cavity size was normal in size. There is moderate left ventricular hypertrophy. Left ventricular diastolic parameters are consistent with Grade I diastolic dysfunction (impaired relaxation). Elevated left ventricular end-diastolic pressure.  Right Ventricle: The right ventricular size is normal. No increase in right ventricular wall thickness. Right ventricular systolic function is normal. There is normal pulmonary artery systolic pressure. The tricuspid regurgitant velocity is 2.73 m/s, and with an assumed right atrial pressure of 3 mmHg, the estimated right ventricular systolic pressure is 32.8 mmHg.  Left Atrium: Left atrial size was normal in size.  Right Atrium: Right atrial size was normal in size.  Pericardium: There is no evidence of pericardial effusion.  Mitral Valve: The mitral valve is normal in structure. Trivial mitral valve regurgitation. No evidence of mitral valve stenosis.  Tricuspid Valve: The tricuspid valve is normal in structure. Tricuspid valve regurgitation is trivial. No evidence of tricuspid stenosis.  Aortic Valve: The aortic valve is tricuspid. There is severe calcifcation of the aortic valve. Aortic valve regurgitation is moderate. Aortic regurgitation PHT measures 381 msec. Severe aortic stenosis is present. Aortic valve mean gradient measures 40.0 mmHg. Aortic valve peak gradient measures 59.9 mmHg. Aortic valve area, by VTI measures 0.78 cm.  Pulmonic Valve: The  pulmonic valve was normal in structure. Pulmonic valve regurgitation is not visualized. No evidence of pulmonic stenosis.  Aorta: The aortic root is normal in size and structure.  Venous: The inferior vena cava is normal in size with greater than 50% respiratory variability, suggesting right atrial pressure of 3 mmHg.  IAS/Shunts: No atrial level shunt detected by color flow Doppler.  Additional Comments: 3D was performed not requiring image post processing on an independent workstation and was abnormal.   LEFT VENTRICLE PLAX 2D LVIDd:  5.20 cm         Diastology LVIDs:         4.20 cm         LV e' medial:    5.77 cm/s LV PW:         1.50 cm         LV E/e' medial:  20.0 LV IVS:        1.20 cm         LV e' lateral:   5.82 cm/s LVOT diam:     2.00 cm         LV E/e' lateral: 19.8 LV SV:         73 LV SV Index:   38              2D Longitudinal LVOT Area:     3.14 cm        Strain 2D Strain GLS   -11.2 % Avg: LV Volumes (MOD) LV vol d, MOD    123.0 ml      3D Volume EF A2C:                           LV 3D EF:    Left LV vol d, MOD    158.0 ml                   ventricul A4C:                                        ar LV vol s, MOD    73.9 ml                    ejection A2C:                                        fraction LV vol s, MOD    85.9 ml                    by 3D A4C:                                        volume is LV SV MOD A2C:   49.1 ml                    42 %. LV SV MOD A4C:   158.0 ml LV SV MOD BP:    56.1 ml 3D Volume EF: 3D EF:        42 % LV EDV:       259 ml LV ESV:       149 ml LV SV:        110 ml  RIGHT VENTRICLE RV S prime:     11.00 cm/s TAPSE (M-mode): 2.1 cm  LEFT ATRIUM             Index        RIGHT ATRIUM           Index LA diam:        4.00 cm 2.07 cm/m   RA Area:     17.60 cm LA Vol (A2C):  66.6 ml 34.54 ml/m  RA Volume:   50.50 ml  26.19 ml/m LA Vol (A4C):   51.9 ml 26.92 ml/m LA Biplane Vol: 63.5 ml 32.94 ml/m AORTIC  VALVE AV Area (Vmax):    0.75 cm AV Area (Vmean):   0.66 cm AV Area (VTI):     0.78 cm AV Vmax:           387.00 cm/s AV Vmean:          298.000 cm/s AV VTI:            0.931 m AV Peak Grad:      59.9 mmHg AV Mean Grad:      40.0 mmHg LVOT Vmax:         92.40 cm/s LVOT Vmean:        63.000 cm/s LVOT VTI:          0.232 m LVOT/AV VTI ratio: 0.25 AI PHT:            381 msec  AORTA Ao Root diam: 3.50 cm  MITRAL VALVE                TRICUSPID VALVE MV Area (PHT): 3.10 cm     TR Peak grad:   29.8 mmHg MV Decel Time: 245 msec     TR Vmax:        273.00 cm/s MV E velocity: 115.50 cm/s MV A velocity: 98.75 cm/s   SHUNTS MV E/A ratio:  1.17         Systemic VTI:  0.23 m Systemic Diam: 2.00 cm  Vishnu Priya Gordon Robinson Electronically signed by Gordon Late Gordon Robinson Signature Date/Time: 01/07/2024/2:34:38 PM    Final          ______________________________________________________________________________________________           Recent Labs: 05/20/2023: ALT 17 05/22/2023: BUN 19; Creatinine, Ser 0.91; Hemoglobin 13.2; Magnesium  2.4; Platelets 227; Potassium 4.4; Sodium 134  Recent Lipid Panel No results found for: CHOL, TRIG, HDL, CHOLHDL, VLDL, LDLCALC, LDLDIRECT   Risk Assessment/Calculations:                Physical Exam:    VS:  BP 124/64 (BP Location: Right Arm, Patient Position: Sitting, Cuff Size: Normal)   Pulse 88   Ht 5' 11 (1.803 m)   Wt 180 lb (81.6 kg)   SpO2 95%   BMI 25.10 kg/m     Wt Readings from Last 3 Encounters:  05/02/24 180 lb (81.6 kg)  04/06/24 176 lb 3.2 oz (79.9 kg)  12/25/23 162 lb (73.5 kg)     GEN:  Well nourished, well developed in no acute distress HEENT: Normal NECK: No JVD; No carotid bruits LYMPHATICS: No lymphadenopathy CARDIAC: RRR, 3/6 crescendo decrescendo murmur at the right upper sternal border RESPIRATORY:  Clear to auscultation without rales, wheezing or rhonchi  ABDOMEN: Soft, non-tender,  non-distended MUSCULOSKELETAL:  No edema; No deformity  SKIN: Warm and dry NEUROLOGIC:  Alert and oriented x 3 PSYCHIATRIC:  Normal affect   ASSESSMENT:    1. Severe aortic valve stenosis    PLAN:    In order of problems listed above:  The patient appears to suffer from severe, stage D1 aortic stenosis with chronic combined systolic and diastolic heart failure, NYHA functional class 2 limitation.  He experiences both exertional angina and dyspnea.  I personally reviewed his echo study in our system from earlier this spring.He appears to have a tricuspid valve with severe restriction and calcification of the  leaflets.  LVEF is 40 to 45%, mean gradient is 40 mmHg, DI 0.24, and aortic valve area 0.78 cm.  All of these findings are consistent with severe aortic stenosis.  I have reviewed the natural history of aortic stenosis with the patient and their family members who are present today. We have discussed the limitations of medical therapy and the poor prognosis associated with symptomatic aortic stenosis. We have reviewed potential treatment options, including palliative medical therapy, conventional surgical aortic valve replacement, and transcatheter aortic valve replacement. We discussed treatment options in the context of the patient's specific comorbid medical conditions.  The patient would like to proceed with intervention.  He will need to undergo right and left heart catheterization as part of his preop workup.  He understands that he may have significant CAD, especially in light of his anginal symptoms. I have reviewed the risks, indications, and alternatives to cardiac catheterization, possible angioplasty, and stenting with the patient. Risks include but are not limited to bleeding, infection, vascular injury, stroke, myocardial infection, arrhythmia, kidney injury, radiation-related injury in the case of prolonged fluoroscopy use, emergency cardiac surgery, and death. The patient  understands the risks of serious complication is 1-2 in 1000 with diagnostic cardiac cath and 1-2% or less with angioplasty/stenting.  He will undergo CTA studies of the heart as well as the chest, abdomen, and pelvis to assess for anatomic feasibility of TAVR.  Once his studies are completed, his case will be reviewed by our multidisciplinary heart valve team and he will be referred for formal cardiac surgical consultation as part of a multidisciplinary approach to his care.  In review of his medications, he remains on apixaban  after hip replacement surgery last year.  He has no other indication for this, so it will be discontinued and he will start aspirin 81 mg daily.  Otherwise, no changes were made in his medical regimen today.      Informed Consent   Shared Decision Making/Informed Consent The risks [stroke (1 in 1000), death (1 in 1000), kidney failure [usually temporary] (1 in 500), bleeding (1 in 200), allergic reaction [possibly serious] (1 in 200)], benefits (diagnostic support and management of coronary artery disease) and alternatives of a cardiac catheterization were discussed in detail with Mr. Voorhies and he is willing to proceed.       Medication Adjustments/Labs and Tests Ordered: Current medicines are reviewed at length with the patient today.  Concerns regarding medicines are outlined above.  No orders of the defined types were placed in this encounter.  No orders of the defined types were placed in this encounter.   There are no Patient Instructions on file for this visit.   Signed, [name], MD  05/02/2024 8:18 AM    [facility]

## 2024-05-02 ENCOUNTER — Other Ambulatory Visit: Payer: Self-pay

## 2024-05-02 ENCOUNTER — Ambulatory Visit: Attending: Cardiovascular Disease | Admitting: Cardiovascular Disease

## 2024-05-02 ENCOUNTER — Encounter: Payer: Self-pay | Admitting: Cardiovascular Disease

## 2024-05-02 VITALS — BP 124/64 | HR 88 | Ht 71.0 in | Wt 180.0 lb

## 2024-05-02 DIAGNOSIS — I350 Nonrheumatic aortic (valve) stenosis: Secondary | ICD-10-CM | POA: Diagnosis not present

## 2024-05-02 MED ORDER — ASPIRIN 81 MG PO TBEC: 81.0000 mg | DELAYED_RELEASE_TABLET | Freq: Every day | ORAL | Status: AC

## 2024-05-02 NOTE — Patient Instructions (Signed)
 Medication Instructions:  Your physician recommends that you continue on your current medications as directed. Please refer to the Current Medication list given to you today.  *If you need a refill on your cardiac medications before your next appointment, please call your pharmacy*  Lab Work: TODAY: BMET and CBC  Testing/Procedures: Cardiac CT  Your physician has requested that you have cardiac CT. Cardiac computed tomography (CT) is a painless test that uses an x-ray machine to take clear, detailed pictures of your heart. For further information please visit [email or web address] Please follow instruction sheet as given.  Heart Catheterization  Your physician has requested that you have a cardiac catheterization. Cardiac catheterization is used to diagnose and/or treat various heart conditions. Doctors may recommend this procedure for a number of different reasons. The most common reason is to evaluate chest pain. Chest pain can be a symptom of coronary artery disease (CAD), and cardiac catheterization can show whether plaque is narrowing or blocking your heart's arteries. This procedure is also used to evaluate the valves, as well as measure the blood flow and oxygen levels in different parts of your heart. For further information please visit [email or web address] Please follow instruction sheet, as given.   Follow-Up: At [facility], you and your health needs are our priority.  As part of our continuing mission to provide you with exceptional heart care, our providers are all part of one team.  This team includes your primary Cardiologist (physician) and Advanced Practice Providers or APPs (Physician Assistants and Nurse Practitioners) who all work together to provide you with the care you need, when you need it.  Your next appointment:   We will be in contact with you to schedule your pre-procedure appointment

## 2024-05-02 NOTE — Telephone Encounter (Signed)
 Spoke with the patient and made him aware that he could stop Eliquis  and start ASA 81 mg daily. Patient verbalized understanding.

## 2024-05-02 NOTE — Progress Notes (Signed)
 Pre Surgical Assessment: 5 M Walk Test  40M=16.62ft  5 Meter Walk Test- trial 1: 6.35 seconds 5 Meter Walk Test- trial 2: 5.30 seconds 5 Meter Walk Test- trial 3: 5.20 seconds 5 Meter Walk Test Average: 5.62 seconds

## 2024-05-03 LAB — BASIC METABOLIC PANEL WITH GFR
BUN/Creatinine Ratio: 19 (ref 10–24)
BUN: 25 mg/dL (ref 8–27)
CO2: 19 mmol/L — AB (ref 20–29)
Calcium: 10 mg/dL (ref 8.6–10.2)
Chloride: 100 mmol/L (ref 96–106)
Creatinine, Ser: 1.33 mg/dL — AB (ref 0.76–1.27)
Glucose: 278 mg/dL — AB (ref 70–99)
Potassium: 5 mmol/L (ref 3.5–5.2)
Sodium: 137 mmol/L (ref 134–144)
eGFR: 57 mL/min/{1.73_m2} — AB (ref >59)

## 2024-05-03 LAB — CBC
Hematocrit: 50.3 % (ref 37.5–51.0)
Hemoglobin: 16 g/dL (ref 13.0–17.7)
MCH: 28.8 pg (ref 26.6–33.0)
MCHC: 31.8 g/dL (ref 31.5–35.7)
MCV: 91 fL (ref 79–97)
Platelets: 185 10*3/uL (ref 150–450)
RBC: 5.56 x10E6/uL (ref 4.14–5.80)
RDW: 12.2 % (ref 11.6–15.4)
WBC: 6.7 10*3/uL (ref 3.4–10.8)

## 2024-05-04 ENCOUNTER — Ambulatory Visit: Payer: Self-pay | Admitting: Cardiovascular Disease

## 2024-05-11 ENCOUNTER — Other Ambulatory Visit: Payer: Self-pay | Admitting: Physician Assistant

## 2024-05-11 ENCOUNTER — Encounter: Payer: Self-pay | Admitting: Physician Assistant

## 2024-05-11 DIAGNOSIS — I350 Nonrheumatic aortic (valve) stenosis: Secondary | ICD-10-CM

## 2024-05-11 MED ORDER — METOPROLOL TARTRATE 50 MG PO TABS: 50.0000 mg | ORAL_TABLET | ORAL | 0 refills | Status: DC

## 2024-05-12 ENCOUNTER — Telehealth: Payer: Self-pay | Admitting: Internal Medicine

## 2024-05-12 NOTE — Telephone Encounter (Signed)
 Pts sister would like a c/b in regards to the pt upcoming procedure she was never told a time to arrive or any instructions. Please advise

## 2024-05-12 NOTE — Telephone Encounter (Signed)
 Spoke with pt's sister in law (per DPR) regarding his upcoming heart cath. Sister in law stated she does not have any instructions for the procedure. No instructions were found in the pt's AVS when the Dr. ordered the cath. Sister in law was told that the pt's instructions would be sent via MyChart. Sister in law verbalized understanding. All questions if any were answered. Instructions sent via MyChart.

## 2024-05-23 ENCOUNTER — Telehealth: Payer: Self-pay | Admitting: *Deleted

## 2024-05-23 NOTE — Telephone Encounter (Addendum)
 Cardiac Catheterization scheduled at [facility] for: Tuesday May 24, 2024 7:30 AM Arrival time [facility] Main Entrance A at: 5:30 AM  Nothing to eat after midnight prior to procedure, clear liquids until 5 AM day of procedure.  Medication instructions: -Hold:  Metformin-day of procedure and 48 hours post procedure   Jardiance -pt tells me he is not taking Jardiance  -Other usual morning medications can be taken with sips of water including aspirin  81 mg.  Plan to go home the same day, you will only stay overnight if medically necessary.  You must have responsible adult to drive you home.  Someone must be with you the first 24 hours after you arrive home.  Reviewed procedure instructions with patient.

## 2024-05-24 ENCOUNTER — Encounter (HOSPITAL_COMMUNITY): Payer: Self-pay | Admitting: Cardiovascular Disease

## 2024-05-24 ENCOUNTER — Ambulatory Visit (HOSPITAL_COMMUNITY)
Admission: RE | Admit: 2024-05-24 | Discharge: 2024-05-24 | Disposition: A | Discharge status: Home / Self Care | Attending: Cardiovascular Disease | Admitting: Cardiovascular Disease

## 2024-05-24 ENCOUNTER — Other Ambulatory Visit: Payer: Self-pay

## 2024-05-24 ENCOUNTER — Encounter (HOSPITAL_COMMUNITY)
Admission: RE | Disposition: A | Discharge status: Home / Self Care | Payer: Self-pay | Source: Home / Self Care | Attending: Cardiovascular Disease

## 2024-05-24 DIAGNOSIS — Z7984 Long term (current) use of oral hypoglycemic drugs: Secondary | ICD-10-CM | POA: Insufficient documentation

## 2024-05-24 DIAGNOSIS — Z79899 Other long term (current) drug therapy: Secondary | ICD-10-CM | POA: Diagnosis not present

## 2024-05-24 DIAGNOSIS — I2510 Atherosclerotic heart disease of native coronary artery without angina pectoris: Secondary | ICD-10-CM

## 2024-05-24 DIAGNOSIS — I350 Nonrheumatic aortic (valve) stenosis: Secondary | ICD-10-CM

## 2024-05-24 DIAGNOSIS — I25118 Atherosclerotic heart disease of native coronary artery with other forms of angina pectoris: Secondary | ICD-10-CM | POA: Insufficient documentation

## 2024-05-24 DIAGNOSIS — I5042 Chronic combined systolic (congestive) and diastolic (congestive) heart failure: Secondary | ICD-10-CM | POA: Diagnosis not present

## 2024-05-24 DIAGNOSIS — I352 Nonrheumatic aortic (valve) stenosis with insufficiency: Secondary | ICD-10-CM | POA: Diagnosis present

## 2024-05-24 DIAGNOSIS — N189 Chronic kidney disease, unspecified: Secondary | ICD-10-CM | POA: Diagnosis not present

## 2024-05-24 DIAGNOSIS — E1122 Type 2 diabetes mellitus with diabetic chronic kidney disease: Secondary | ICD-10-CM | POA: Insufficient documentation

## 2024-05-24 DIAGNOSIS — Z7982 Long term (current) use of aspirin: Secondary | ICD-10-CM | POA: Insufficient documentation

## 2024-05-24 DIAGNOSIS — Z87891 Personal history of nicotine dependence: Secondary | ICD-10-CM | POA: Insufficient documentation

## 2024-05-24 DIAGNOSIS — I130 Hypertensive heart and chronic kidney disease with heart failure and stage 1 through stage 4 chronic kidney disease, or unspecified chronic kidney disease: Secondary | ICD-10-CM | POA: Diagnosis not present

## 2024-05-24 DIAGNOSIS — F1411 Cocaine abuse, in remission: Secondary | ICD-10-CM | POA: Diagnosis not present

## 2024-05-24 HISTORY — PX: RIGHT HEART CATH AND CORONARY ANGIOGRAPHY: CATH118264

## 2024-05-24 LAB — POCT I-STAT EG7
Acid-base deficit: 1 mmol/L (ref 0.0–2.0)
Acid-base deficit: 1 mmol/L (ref 0.0–2.0)
Bicarbonate: 25.2 mmol/L (ref 20.0–28.0)
Bicarbonate: 25.6 mmol/L (ref 20.0–28.0)
Calcium, Ion: 1.26 mmol/L (ref 1.15–1.40)
Calcium, Ion: 1.26 mmol/L (ref 1.15–1.40)
HCT: 43 % (ref 39.0–52.0)
HCT: 43 % (ref 39.0–52.0)
Hemoglobin: 14.6 g/dL (ref 13.0–17.0)
Hemoglobin: 14.6 g/dL (ref 13.0–17.0)
O2 Saturation: 69 %
O2 Saturation: 69 %
Potassium: 3.8 mmol/L (ref 3.5–5.1)
Potassium: 3.8 mmol/L (ref 3.5–5.1)
Sodium: 133 mmol/L — ABNORMAL LOW (ref 135–145)
Sodium: 133 mmol/L — ABNORMAL LOW (ref 135–145)
TCO2: 27 mmol/L (ref 22–32)
TCO2: 27 mmol/L (ref 22–32)
pCO2, Ven: 47.5 mmHg (ref 44–60)
pCO2, Ven: 47.6 mmHg (ref 44–60)
pH, Ven: 7.332 (ref 7.25–7.43)
pH, Ven: 7.339 (ref 7.25–7.43)
pO2, Ven: 39 mmHg (ref 32–45)
pO2, Ven: 39 mmHg (ref 32–45)

## 2024-05-24 LAB — POCT I-STAT 7, (LYTES, BLD GAS, ICA,H+H)
Acid-base deficit: 2 mmol/L (ref 0.0–2.0)
Bicarbonate: 22.9 mmol/L (ref 20.0–28.0)
Calcium, Ion: 1.18 mmol/L (ref 1.15–1.40)
HCT: 40 % (ref 39.0–52.0)
Hemoglobin: 13.6 g/dL (ref 13.0–17.0)
O2 Saturation: 95 %
Potassium: 3.6 mmol/L (ref 3.5–5.1)
Sodium: 132 mmol/L — ABNORMAL LOW (ref 135–145)
TCO2: 24 mmol/L (ref 22–32)
pCO2 arterial: 38.7 mmHg (ref 32–48)
pH, Arterial: 7.381 (ref 7.35–7.45)
pO2, Arterial: 78 mmHg — ABNORMAL LOW (ref 83–108)

## 2024-05-24 LAB — GLUCOSE, CAPILLARY
Glucose-Capillary: 324 mg/dL — ABNORMAL HIGH (ref 70–99)
Glucose-Capillary: 269 mg/dL — ABNORMAL HIGH (ref 70–99)

## 2024-05-24 SURGERY — RIGHT HEART CATH AND CORONARY ANGIOGRAPHY
Anesthesia: LOCAL

## 2024-05-24 MED ORDER — SODIUM CHLORIDE 0.9 % IV SOLN
INTRAVENOUS | Status: DC
Start: 2024-05-24 — End: 2024-05-24

## 2024-05-24 MED ORDER — ASPIRIN 81 MG PO CHEW: 81.0000 mg | CHEWABLE_TABLET | ORAL | Status: AC

## 2024-05-24 MED ORDER — HEPARIN (PORCINE) IN NACL 1000-0.9 UT/500ML-% IV SOLN
INTRAVENOUS | Status: DC | PRN
  Administered 2024-05-24: 1000 mL

## 2024-05-24 MED ORDER — LIDOCAINE HCL (PF) 1 % IJ SOLN
INTRAMUSCULAR | Status: DC | PRN
  Administered 2024-05-24 (×2): 2 mL via INTRADERMAL

## 2024-05-24 MED ORDER — HEPARIN SODIUM (PORCINE) 1000 UNIT/ML IJ SOLN
INTRAMUSCULAR | Status: AC
  Filled 2024-05-24: qty 10

## 2024-05-24 MED ORDER — HEPARIN SODIUM (PORCINE) 1000 UNIT/ML IJ SOLN
INTRAMUSCULAR | Status: DC | PRN
  Administered 2024-05-24: 4000 [IU] via INTRAVENOUS

## 2024-05-24 MED ORDER — INSULIN ASPART 100 UNIT/ML IJ SOLN
INTRAMUSCULAR | Status: DC
Start: 2024-05-24 — End: 2024-05-24
  Filled 2024-05-24: qty 1

## 2024-05-24 MED ORDER — SODIUM CHLORIDE 0.9 % IV SOLN
INTRAVENOUS | Status: DC | PRN
  Administered 2024-05-24: 10 mL/h via INTRAVENOUS

## 2024-05-24 MED ORDER — SODIUM CHLORIDE 0.9% FLUSH
3.0000 mL | Freq: Two times a day (BID) | INTRAVENOUS | Status: DC
Start: 2024-05-24 — End: 2024-05-24

## 2024-05-24 MED ORDER — SODIUM CHLORIDE 0.9 % WEIGHT BASED INFUSION: 3.0000 mL/kg/h | INTRAVENOUS | Status: AC

## 2024-05-24 MED ORDER — LABETALOL HCL 5 MG/ML IV SOLN: 10.0000 mg | INTRAVENOUS | Status: DC | PRN

## 2024-05-24 MED ORDER — MIDAZOLAM HCL 2 MG/2ML IJ SOLN
INTRAMUSCULAR | Status: DC | PRN
  Administered 2024-05-24: 2 mg via INTRAVENOUS

## 2024-05-24 MED ORDER — INSULIN ASPART 100 UNIT/ML IJ SOLN
6.0000 [IU] | Freq: Once | INTRAMUSCULAR | Status: AC
  Administered 2024-05-24: 6 [IU] via SUBCUTANEOUS

## 2024-05-24 MED ORDER — SODIUM CHLORIDE 0.9 % IV SOLN: 250.0000 mL | INTRAVENOUS | Status: DC | PRN

## 2024-05-24 MED ORDER — IOHEXOL 350 MG/ML SOLN
INTRAVENOUS | Status: DC | PRN
  Administered 2024-05-24: 50 mL

## 2024-05-24 MED ORDER — HYDRALAZINE HCL 20 MG/ML IJ SOLN: 10.0000 mg | INTRAMUSCULAR | Status: DC | PRN

## 2024-05-24 MED ORDER — SODIUM CHLORIDE 0.9 % WEIGHT BASED INFUSION: 1.0000 mL/kg/h | INTRAVENOUS | Status: DC

## 2024-05-24 MED ORDER — MIDAZOLAM HCL 2 MG/2ML IJ SOLN
INTRAMUSCULAR | Status: AC
Start: 2024-05-24 — End: 2024-05-24
  Filled 2024-05-24: qty 2

## 2024-05-24 MED ORDER — VERAPAMIL HCL 2.5 MG/ML IV SOLN
INTRAVENOUS | Status: AC
  Filled 2024-05-24: qty 2

## 2024-05-24 MED ORDER — FENTANYL CITRATE (PF) 100 MCG/2ML IJ SOLN
INTRAMUSCULAR | Status: AC
  Filled 2024-05-24: qty 2

## 2024-05-24 MED ORDER — VERAPAMIL HCL 2.5 MG/ML IV SOLN
INTRAVENOUS | Status: DC | PRN
  Administered 2024-05-24: 10 mL via INTRA_ARTERIAL

## 2024-05-24 MED ORDER — FENTANYL CITRATE (PF) 100 MCG/2ML IJ SOLN
INTRAMUSCULAR | Status: DC | PRN
  Administered 2024-05-24: 25 ug via INTRAVENOUS

## 2024-05-24 MED ORDER — SODIUM CHLORIDE 0.9% FLUSH: 3.0000 mL | INTRAVENOUS | Status: DC | PRN

## 2024-05-24 MED ORDER — ONDANSETRON HCL 4 MG/2ML IJ SOLN: 4.0000 mg | Freq: Four times a day (QID) | INTRAMUSCULAR | Status: DC | PRN

## 2024-05-24 MED ORDER — ACETAMINOPHEN 325 MG PO TABS: 650.0000 mg | ORAL_TABLET | ORAL | Status: DC | PRN

## 2024-05-24 MED ORDER — LIDOCAINE HCL (PF) 1 % IJ SOLN
INTRAMUSCULAR | Status: AC
  Filled 2024-05-24: qty 30

## 2024-05-24 SURGICAL SUPPLY — 11 items
CATH 5FR JL3.5 JR4 ANG PIG MP (CATHETERS) IMPLANT
CATH BALLN WEDGE 5F 110CM (CATHETERS) IMPLANT
DEVICE RAD COMP TR BAND LRG (VASCULAR PRODUCTS) IMPLANT
GLIDESHEATH SLEND SS 6F .021 (SHEATH) IMPLANT
GUIDEWIRE .025 260CM (WIRE) IMPLANT
GUIDEWIRE INQWIRE 1.5J.035X260 (WIRE) IMPLANT
KIT SINGLE USE MANIFOLD (KITS) IMPLANT
KIT SYRINGE INJ CVI SPIKEX1 (MISCELLANEOUS) IMPLANT
PACK CARDIAC CATHETERIZATION (CUSTOM PROCEDURE TRAY) ×1 IMPLANT
SET ATX-X65L (MISCELLANEOUS) IMPLANT
SHEATH GLIDE SLENDER 4/5FR (SHEATH) IMPLANT

## 2024-05-24 NOTE — Interval H&P Note (Signed)
 History and Physical Interval Note:  05/24/2024 6:14 AM  Gordon Robinson  has presented today for surgery, with the diagnosis of aortic stenosis.  The various methods of treatment have been discussed with the patient and family. After consideration of risks, benefits and other options for treatment, the patient has consented to  Procedure(s): RIGHT/LEFT HEART CATH AND CORONARY ANGIOGRAPHY (N/A) as a surgical intervention.  The patient's history has been reviewed, patient examined, no change in status, stable for surgery.  I have reviewed the patient's chart and labs.  Questions were answered to the patient's satisfaction.     [name]

## 2024-05-24 NOTE — Discharge Instructions (Signed)
NO METFORMIN FOR 2 DAYS 

## 2024-05-31 ENCOUNTER — Ambulatory Visit (HOSPITAL_COMMUNITY)
Admit: 2024-05-31 | Discharge: 2024-05-31 | Disposition: A | Discharge status: Home / Self Care | Attending: Physician Assistant | Admitting: Physician Assistant

## 2024-05-31 ENCOUNTER — Ambulatory Visit (HOSPITAL_COMMUNITY)

## 2024-05-31 DIAGNOSIS — R911 Solitary pulmonary nodule: Secondary | ICD-10-CM | POA: Diagnosis not present

## 2024-05-31 DIAGNOSIS — I2510 Atherosclerotic heart disease of native coronary artery without angina pectoris: Secondary | ICD-10-CM | POA: Diagnosis not present

## 2024-05-31 DIAGNOSIS — I350 Nonrheumatic aortic (valve) stenosis: Secondary | ICD-10-CM | POA: Diagnosis present

## 2024-05-31 DIAGNOSIS — J439 Emphysema, unspecified: Secondary | ICD-10-CM | POA: Diagnosis not present

## 2024-05-31 MED ORDER — IOHEXOL 350 MG/ML SOLN
75.0000 mL | Freq: Once | INTRAVENOUS | Status: AC | PRN
  Administered 2024-05-31: 75 mL via INTRAVENOUS

## 2024-06-03 DIAGNOSIS — I2510 Atherosclerotic heart disease of native coronary artery without angina pectoris: Secondary | ICD-10-CM

## 2024-06-03 HISTORY — DX: Atherosclerotic heart disease of native coronary artery without angina pectoris: I25.10

## 2024-06-06 ENCOUNTER — Ambulatory Visit: Payer: Self-pay | Admitting: Physician Assistant

## 2024-06-07 ENCOUNTER — Ambulatory Visit: Attending: Surgery | Admitting: Surgery

## 2024-06-07 ENCOUNTER — Other Ambulatory Visit: Payer: Self-pay | Admitting: *Deleted

## 2024-06-07 ENCOUNTER — Encounter: Payer: Self-pay | Admitting: Surgery

## 2024-06-07 VITALS — BP 150/76 | HR 85 | Resp 18 | Ht 71.0 in | Wt 178.0 lb

## 2024-06-07 DIAGNOSIS — I350 Nonrheumatic aortic (valve) stenosis: Secondary | ICD-10-CM

## 2024-06-07 DIAGNOSIS — I2510 Atherosclerotic heart disease of native coronary artery without angina pectoris: Secondary | ICD-10-CM | POA: Diagnosis not present

## 2024-06-07 NOTE — Progress Notes (Signed)
 [address] [place] [address]             [phone]    Cardiothoracic Surgery Consultation   PCP is Gordon Llano, MD Referring Provider is Gordon Sharper, MD  Chief Complaint  Patient presents with   Consult    ECHO 3/6, Cath 7/22, CTA 7/29    HPI:  The patient is a 72 year old gentleman with a history of type 2 diabetes, hypertension, smoking and COPD, chronic kidney disease, chronic systolic heart failure, aortic stenosis and aortic insufficiency who was therefore to the structural heart valve clinic for TAVR evaluation.  The majority of his care has been through the [facility].  He showed me a lab report from the [facility] recently that showed his hemoglobin A1c was 14.5.  He was recently started on insulin  for that.  His most recent echocardiogram in our system was in March 2025 and showed a moderately reduced left ventricular ejection fraction 40 to 45% with a mean transvalvular gradient of 40 mmHg and a valve area of 0.78 cm with a dimensionless index of 0.24.  There is moderate aortic insufficiency.  He reports exertional fatigue and shortness of breath over the past year as well as episodes of exertional substernal chest pain which are occurring more frequently and more severe.  He denies orthopnea and peripheral edema.  He has had occasional episodes of dizziness.  He has a history of smoking and cocaine use but said that he has not used either in 3 or 4 months.  He has full upper dentures and partial lower dentures.  He is here today with his brother and sister-in-law. Past Medical History:  Diagnosis Date   Aortic atherosclerosis (HCC)    Chronic kidney disease    Chronic systolic heart failure (HCC)    COPD (chronic obstructive pulmonary disease) (HCC)    Diabetes mellitus    Hypertension    Thyroid disease     Past Surgical History:  Procedure Laterality Date   LIVER BIOPSY     ORIF ACETABULAR FRACTURE Right 05/19/2023   Procedure: OPEN  REDUCTION INTERNAL FIXATION (ORIF) ACETABULAR FRACTURE;  Surgeon: Gordon Sharper, MD;  Location: [facility] OR;  Service: Orthopedics;  Laterality: Right;   RIGHT HEART CATH AND CORONARY ANGIOGRAPHY N/A 05/24/2024   Procedure: RIGHT HEART CATH AND CORONARY ANGIOGRAPHY;  Surgeon: Gordon Sharper, MD;  Location: [facility] INVASIVE CV LAB;  Service: Cardiovascular;  Laterality: N/A;    History reviewed. No pertinent family history.  Social History Social History   Tobacco Use   Smoking status: Former    Current packs/day: 0.00    Average packs/day: 0.5 packs/day for 55.3 years (27.7 ttl pk-yrs)    Types: Cigarettes    Start date: 89    Quit date: 03/03/2024    Years since quitting: 0.2   Smokeless tobacco: Never  Vaping Use   Vaping status: Never Used  Substance Use Topics   Alcohol  use: Yes    Comment: occassinally   Drug use: Yes    Types: Cocaine    Current Outpatient Medications  Medication Sig Dispense Refill   aspirin  EC 81 MG tablet Take 1 tablet (81 mg total) by mouth daily. Swallow whole.     Cholecalciferol  (VITAMIN D ) 125 MCG (5000 UT) CAPS Take 1 capsule by mouth daily. 30 capsule 6   diclofenac Sodium (VOLTAREN) 1 % GEL Apply 2 g topically 3 (three) times daily as needed (pain).  empagliflozin  (JARDIANCE ) 25 MG TABS tablet Take 25 mg by mouth daily.     fluticasone (FLONASE) 50 MCG/ACT nasal spray Place 2 sprays into both nostrils daily.     levothyroxine  (SYNTHROID ) 137 MCG tablet Take 137 mcg by mouth daily before breakfast.     metFORMIN (GLUCOPHAGE) 500 MG tablet Take 1 tablet (500 mg total) by mouth daily.     Multiple Vitamin (MULTIVITAMIN) tablet Take 1 tablet by mouth daily.     polyethylene glycol powder (MIRALAX ) 17 GM/SCOOP powder Take 17 g by mouth 2 (two) times daily as needed for moderate constipation.     simvastatin  (ZOCOR ) 10 MG tablet Take 1 tablet (10 mg total) by mouth daily.     tamsulosin (FLOMAX) 0.4 MG CAPS capsule Take 0.4 mg by mouth at bedtime as needed  (Prostate). (Patient taking differently: Take 0.4 mg by mouth 2 (two) times daily.)     No current facility-administered medications for this visit.    No Known Allergies  Review of Systems  Constitutional:  Positive for activity change and fatigue.  HENT: Negative.    Eyes: Negative.   Respiratory:  Positive for shortness of breath.   Cardiovascular:  Positive for chest pain. Negative for leg swelling.  Gastrointestinal: Negative.   Endocrine: Negative.   Genitourinary: Negative.   Musculoskeletal: Negative.   Skin: Negative.   Allergic/Immunologic: Negative.   Neurological:  Positive for dizziness. Negative for syncope.  Hematological: Negative.   Psychiatric/Behavioral: Negative.      BP (!) 150/76 (BP Location: Left Arm)   Pulse 85   Resp 18   Ht 5' 11 (1.803 m)   Wt 178 lb (80.7 kg)   SpO2 94%   BMI 24.83 kg/m  Physical Exam Constitutional:      Appearance: Normal appearance. He is normal weight.  HENT:     Head: Normocephalic and atraumatic.     Mouth/Throat:     Mouth: Mucous membranes are moist.     Pharynx: Oropharynx is clear.  Eyes:     Extraocular Movements: Extraocular movements intact.     Conjunctiva/sclera: Conjunctivae normal.     Pupils: Pupils are equal, round, and reactive to light.  Neck:     Comments: Transmitted murmur to both sides of the neck Cardiovascular:     Rate and Rhythm: Normal rate and regular rhythm.     Pulses: Normal pulses.     Heart sounds: Murmur heard.     Comments: 3/6 systolic murmur along the right sternal border.  There is no diastolic murmur. Pulmonary:     Effort: Pulmonary effort is normal.     Breath sounds: Normal breath sounds.  Abdominal:     General: There is no distension.     Tenderness: There is no abdominal tenderness.  Musculoskeletal:        General: No swelling.     Cervical back: Normal range of motion and neck supple.  Skin:    General: Skin is warm and dry.  Neurological:     General: No  focal deficit present.     Mental Status: He is alert and oriented to person, place, and time.  Psychiatric:        Mood and Affect: Mood normal.        Behavior: Behavior normal.      Diagnostic Tests:  ECHOCARDIOGRAM REPORT       Patient Name:   [facility] [place] Date of Exam: 01/07/2024  Medical Rec #:  [id number]   Height:  71.0 in  Accession #:    [id number]  Weight:       162.0 lb  Date of Birth:  07/25/1951    BSA:          1.928 m  Patient Age:    72 years    BP:           131/79 mmHg  Patient Gender: M           HR:           71 bpm.  Exam Location:  [name]   Procedure: 2D Echo, Cardiac Doppler, Color Doppler, Strain Analysis and 3D  Echo            (Both Spectral and Color Flow Doppler were utilized during             procedure).   Indications:    Cardiomyopathy [facility]) [[id number]]                  Severe aortic stenosis [612435]    History:        Patient has no prior history of Echocardiogram  examinations.                 Cardiomyopathy and CHF, COPD, Aortic Valve Disease,                  Signs/Symptoms:Murmur; Risk Factors:Current Smoker,                  Dyslipidemia, Diabetes and Hypertension.    Sonographer:    [name]  Referring Phys: [id number] VISHNU P MALLIPEDDI     Sonographer Comments: Global longitudinal strain was attempted.  IMPRESSIONS     1. Left ventricular ejection fraction, by estimation, is 40 to 45%. Left  ventricular ejection fraction by 3D volume is 42 %. The left ventricle has  mildly decreased function. The left ventricle demonstrates global  hypokinesis. There is moderate left  ventricular hypertrophy. Left ventricular diastolic parameters are  consistent with Grade I diastolic dysfunction (impaired relaxation).  Elevated left ventricular end-diastolic pressure. The average left  ventricular global longitudinal strain is -11.2 %.  The global longitudinal strain is abnormal.   2. Right ventricular systolic function is  normal. The right ventricular  size is normal. There is normal pulmonary artery systolic pressure.   3. The mitral valve is normal in structure. Trivial mitral valve  regurgitation. No evidence of mitral stenosis.   4. The aortic valve is tricuspid. There is severe calcifcation of the  aortic valve. Aortic valve regurgitation is moderate. Severe aortic valve  stenosis. Aortic regurgitation PHT measures 381 msec. Aortic valve area,  by VTI measures 0.78 cm. Aortic  valve mean gradient measures 40.0 mmHg. Aortic valve Vmax measures 3.87  m/s. DVI is 0.24.   5. The inferior vena cava is normal in size with greater than 50%  respiratory variability, suggesting right atrial pressure of 3 mmHg.   Comparison(s): No prior Echocardiogram.   FINDINGS   Left Ventricle: Left ventricular ejection fraction, by estimation, is 40  to 45%. Left ventricular ejection fraction by 3D volume is 42 %. The left  ventricle has mildly decreased function. The left ventricle demonstrates  global hypokinesis. The average  left ventricular global longitudinal strain is -11.2 %. Strain was  performed and the global longitudinal strain is abnormal. The left  ventricular internal cavity size was normal in size. There is moderate  left ventricular hypertrophy. Left ventricular  diastolic parameters are  consistent with Grade I diastolic dysfunction  (impaired relaxation). Elevated left ventricular end-diastolic pressure.   Right Ventricle: The right ventricular size is normal. No increase in  right ventricular wall thickness. Right ventricular systolic function is  normal. There is normal pulmonary artery systolic pressure. The tricuspid  regurgitant velocity is 2.73 m/s, and   with an assumed right atrial pressure of 3 mmHg, the estimated right  ventricular systolic pressure is 32.8 mmHg.   Left Atrium: Left atrial size was normal in size.   Right Atrium: Right atrial size was normal in size.   Pericardium:  There is no evidence of pericardial effusion.   Mitral Valve: The mitral valve is normal in structure. Trivial mitral  valve regurgitation. No evidence of mitral valve stenosis.   Tricuspid Valve: The tricuspid valve is normal in structure. Tricuspid  valve regurgitation is trivial. No evidence of tricuspid stenosis.   Aortic Valve: The aortic valve is tricuspid. There is severe calcifcation  of the aortic valve. Aortic valve regurgitation is moderate. Aortic  regurgitation PHT measures 381 msec. Severe aortic stenosis is present.  Aortic valve mean gradient measures 40.0   mmHg. Aortic valve peak gradient measures 59.9 mmHg. Aortic valve area,  by VTI measures 0.78 cm.   Pulmonic Valve: The pulmonic valve was normal in structure. Pulmonic valve  regurgitation is not visualized. No evidence of pulmonic stenosis.   Aorta: The aortic root is normal in size and structure.   Venous: The inferior vena cava is normal in size with greater than 50%  respiratory variability, suggesting right atrial pressure of 3 mmHg.   IAS/Shunts: No atrial level shunt detected by color flow Doppler.   Additional Comments: 3D was performed not requiring image post processing  on an independent workstation and was abnormal.     LEFT VENTRICLE  PLAX 2D  LVIDd:         5.20 cm         Diastology  LVIDs:         4.20 cm         LV e' medial:    5.77 cm/s  LV PW:         1.50 cm         LV E/e' medial:  20.0  LV IVS:        1.20 cm         LV e' lateral:   5.82 cm/s  LVOT diam:     2.00 cm         LV E/e' lateral: 19.8  LV SV:         73  LV SV Index:   38              2D Longitudinal  LVOT Area:     3.14 cm        Strain                                 2D Strain GLS   -11.2 %                                 Avg:  LV Volumes (MOD)  LV vol d, MOD    123.0 ml      3D Volume EF  A2C:  LV 3D EF:    Left  LV vol d, MOD    158.0 ml                   ventricul  A4C:                                         ar  LV vol s, MOD    73.9 ml                    ejection  A2C:                                        fraction  LV vol s, MOD    85.9 ml                    by 3D  A4C:                                        volume is  LV SV MOD A2C:   49.1 ml                    42 %.  LV SV MOD A4C:   158.0 ml  LV SV MOD BP:    56.1 ml                                 3D Volume EF:                                 3D EF:        42 %                                 LV EDV:       259 ml                                 LV ESV:       149 ml                                 LV SV:        110 ml   RIGHT VENTRICLE  RV S prime:     11.00 cm/s  TAPSE (M-mode): 2.1 cm   LEFT ATRIUM             Index        RIGHT ATRIUM           Index  LA diam:        4.00 cm 2.07 cm/m   RA Area:     17.60 cm  LA Vol (A2C):   66.6 ml 34.54 ml/m  RA Volume:   50.50 ml  26.19 ml/m  LA Vol (A4C):   51.9 ml 26.92 ml/m  LA Biplane Vol: 63.5 ml 32.94 ml/m   AORTIC  VALVE  AV Area (Vmax):    0.75 cm  AV Area (Vmean):   0.66 cm  AV Area (VTI):     0.78 cm  AV Vmax:           387.00 cm/s  AV Vmean:          298.000 cm/s  AV VTI:            0.931 m  AV Peak Grad:      59.9 mmHg  AV Mean Grad:      40.0 mmHg  LVOT Vmax:         92.40 cm/s  LVOT Vmean:        63.000 cm/s  LVOT VTI:          0.232 m  LVOT/AV VTI ratio: 0.25  AI PHT:            381 msec    AORTA  Ao Root diam: 3.50 cm   MITRAL VALVE                TRICUSPID VALVE  MV Area (PHT): 3.10 cm     TR Peak grad:   29.8 mmHg  MV Decel Time: 245 msec     TR Vmax:        273.00 cm/s  MV E velocity: 115.50 cm/s  MV A velocity: 98.75 cm/s   SHUNTS  MV E/A ratio:  1.17         Systemic VTI:  0.23 m                              Systemic Diam: 2.00 cm   Vishnu Priya Mallipeddi  Electronically signed by Diannah Late Mallipeddi  Signature Date/Time: 01/07/2024/2:34:38 PM        Final      Physicians  Panel Physicians Referring Physician  Case Authorizing Physician  Gordon Sharper, MD (Primary)     Procedures  RIGHT HEART CATH AND CORONARY ANGIOGRAPHY   Conclusion  1.  Known severe aortic stenosis by noninvasive assessment.  Aortic valve is calcified and restricted on plain fluoroscopy. 2.  Three-vessel coronary artery disease with nonobstructive stenosis of the dominant RCA, severe stenosis of the large first OM branch of the circumflex, and severe bifurcation stenosis of the LAD/first diagonal 3.  Right heart hemodynamics: RA a mean 3 mmHg RV 34/5 mmHg PA 28/12 mean 19 mmHg Cardiac output 4.7 L/min Cardiac index 2.35 L/min/m   Recommendations: Cardiac surgical consultation for consideration of aortic valve replacement/CABG   Coronary Findings  Diagnostic Dominance: Right Left Anterior Descending  Prox LAD to Mid LAD lesion is 80% stenosed.    First Diagonal Branch  1st Diag lesion is 70% stenosed.    Left Circumflex  Mid Cx lesion is 50% stenosed.    First Obtuse Marginal Branch  1st Mrg lesion is 75% stenosed.    Right Coronary Artery  There is mild diffuse disease throughout the vessel. Dominant vessel, mild to moderate mid vessel stenosis noted. The distal vessel is widely patent. The PDA is patent with mild diffuse plaquing.  Prox RCA lesion is 50% stenosed.  Mid RCA lesion is 50% stenosed.    Right Posterior Descending Artery  RPDA lesion is 40% stenosed.    Intervention   No interventions have been documented.   Left Heart  Aortic Valve There is severe aortic valve stenosis. The aortic valve is calcified. There is restricted aortic valve  motion.   Coronary Diagrams  Diagnostic Dominance: Right   Hemo Data  Flowsheet Row Most Recent Value  Fick Cardiac Output 4.67 L/min  Fick Cardiac Output Index 2.35 (L/min)/BSA  RA A Wave 6 mmHg  RA V Wave 3 mmHg  RA Mean 3 mmHg  RV Systolic Pressure 34 mmHg  RV Diastolic Pressure -2 mmHg  RV EDP 5 mmHg  PA Systolic Pressure 28 mmHg  PA  Diastolic Pressure 12 mmHg  PA Mean 19 mmHg  AO Systolic Pressure 93 mmHg  AO Diastolic Pressure 61 mmHg  AO Mean 75 mmHg  QP/QS 1  TPVR Index 8.08 HRUI  TSVR Index 33.61 HRUI  TPVR/TSVR Ratio 0.24   Narrative & Impression  EXAM: CTA CHEST AORTA 05/31/2024 01:42:04 PM   TECHNIQUE: CTA of the chest was performed after the administration of intravenous contrast. Multiplanar reformatted images are provided for review. MIP images are provided for review. Automated exposure control, iterative reconstruction, and/or weight based adjustment of the mA/kV was utilized to reduce the radiation dose to as low as reasonably achievable.   COMPARISON: 12/25/2022   CLINICAL HISTORY: Aortic valve replacement (TAVR), pre-op eval.   FINDINGS:   AORTA: Ascending thoracic aorta measures 3.7 cm in diameter, aortic arch 3.1 cm, and descending thoracic aorta 2.8 cm. No thoracic aortic dissection.   MEDIASTINUM: Coarse aortic valve calcifications. Scattered LAD coronary calcifications. Bovine variant brachiocephalic arterial anatomy. No mediastinal lymphadenopathy. The heart and pericardium demonstrate no acute abnormality.   LYMPH NODES: No mediastinal, hilar or axillary lymphadenopathy.   LUNGS AND PLEURA: no evidence of pulmonary embolus. Pulmonary emphysema. Pleural-based 1.3 cm nodule in the superior segment right lower lobe (image 310 sequence 303), stable since PET CT 11/07/2021. No focal consolidation or pulmonary edema. No pleural effusion or pneumothorax.   UPPER ABDOMEN: Limited images of the upper abdomen are unremarkable.   SOFT TISSUES AND BONES: No acute bone or soft tissue abnormality.   IMPRESSION: 1. No acute abnormality of the aorta. 2. Stable pleural-based 1.3 cm nodule in the superior segment right lower lobe since PET CT 11/07/2021.   Electronically signed by: Katheleen Faes MD 06/05/2024 08:30 AM EDT RP Workstation: [id number]        Impression:  This  73 year old gentleman has stage D, severe, symptomatic aortic stenosis and moderate aortic insufficiency with NYHA class II symptoms of exertional fatigue and shortness of breath as well as exertional angina.  His catheterization shows severe multivessel coronary disease.  I agree that open surgical aortic valve replacement and coronary bypass graft surgery is the best treatment for relief of his symptoms and to prevent further myocardial loss and left ventricular dysfunction. I discussed the operative procedure with the patient and family including alternatives, benefits and risks; including but not limited to bleeding, blood transfusion, infection, stroke, myocardial infarction, graft failure, heart block requiring a permanent pacemaker, organ dysfunction, and death.  Fairy JONETTA Buddle understands and agrees to proceed.   He has poorly controlled diabetes with a recent hemoglobin A1c of 14.5 and was subsequently started on insulin .  He is monitoring his glucose at home and has follow-up with his primary physician in a few weeks.  We will tentatively plan to schedule surgery on Thursday, 06/23/2024.  I would not recommend waiting longer than that in this gentleman who is having daily episodes of exertional angina in addition to shortness of breath with severe aortic stenosis and multivessel coronary disease.  Plan:  He will be scheduled for aortic valve replacement using a  bioprosthetic valve and coronary bypass graft surgery on Thursday, 06/23/2024.  I spent 60 minutes performing this consultation and > 50% of this time was spent face to face counseling and coordinating the care of this patient's severe aortic stenosis, moderate aortic insufficiency, and multivessel coronary disease.  Dorise MARLA Fellers, MD [facility] [phone]

## 2024-06-08 ENCOUNTER — Encounter: Payer: Self-pay | Admitting: *Deleted

## 2024-06-08 ENCOUNTER — Encounter: Admitting: Surgery

## 2024-06-20 ENCOUNTER — Encounter (HOSPITAL_COMMUNITY): Payer: Self-pay

## 2024-06-20 NOTE — Progress Notes (Signed)
 Surgical Instructions   Your procedure is scheduled on Thursday June 23, 2024. Report to [facility] Main Entrance A at 5:30 A.M., then check in with the Admitting office. Any questions or running late day of surgery: call [phone]  Questions prior to your surgery date: call [phone], Monday-Friday, 8am-4pm. If you experience any cold or flu symptoms such as cough, fever, chills, shortness of breath, etc. between now and your scheduled surgery, please notify us  at the above number.     Remember:  Do not eat or drink after midnight the night before your surgery  Take these medicines the morning of surgery with A SIP OF WATER  fluticasone  (FLONASE )  levothyroxine  (SYNTHROID )  simvastatin  (ZOCOR )   PER YOUR SURGEON'S INSTRUCTIONS CONTINUE YOUR ASPIRIN  BUT DO NOT TAKE IT THE MORNING OF SURGERY  PER YOUR SURGEON'S INSTRUCTIONS HOLD YOUR empagliflozin  (JARDIANCE ) 3 DAYS PRIOR TO SURGERY WITH THE LAST DOSE BEING 06/19/2024    One week prior to surgery, STOP taking any Aleve, Naproxen, Ibuprofen , Motrin , Advil , Goody's, BC's, all herbal medications, fish oil, and non-prescription vitamins.  This includes your diclofenac Sodium (VOLTAREN) 1 % GEL.   WHAT DO I DO ABOUT MY DIABETES MEDICATION?   Do not take oral diabetes medicines (pills) the morning of surgery.  THE MORNING OF SURGERY TAKE 8-9 UNITS OF YOUR insulin  glargine (LANTUS ) WHICH IS 50% OF YOUR REGULAR DOSE.  The day of surgery, do not take other diabetes injectables, including Byetta (exenatide), Bydureon (exenatide ER), Victoza (liraglutide), or Trulicity (dulaglutide).  If your CBG is greater than 220 mg/dL, you may take  of your sliding scale (correction) dose of insulin .   HOW TO MANAGE YOUR DIABETES BEFORE AND AFTER SURGERY  Why is it important to control my blood sugar before and after surgery? Improving blood sugar levels before and after surgery helps healing and can limit problems. A way of improving  blood sugar control is eating a healthy diet by:  Eating less sugar and carbohydrates  Increasing activity/exercise  Talking with your doctor about reaching your blood sugar goals High blood sugars (greater than 180 mg/dL) can raise your risk of infections and slow your recovery, so you will need to focus on controlling your diabetes during the weeks before surgery. Make sure that the doctor who takes care of your diabetes knows about your planned surgery including the date and location.  How do I manage my blood sugar before surgery? Check your blood sugar at least 4 times a day, starting 2 days before surgery, to make sure that the level is not too high or low.  Check your blood sugar the morning of your surgery when you wake up and every 2 hours until you get to the Short Stay unit.  If your blood sugar is less than 70 mg/dL, you will need to treat for low blood sugar: Do not take insulin . Treat a low blood sugar (less than 70 mg/dL) with  cup of clear juice (cranberry or apple), 4 glucose tablets, OR glucose gel. Recheck blood sugar in 15 minutes after treatment (to make sure it is greater than 70 mg/dL). If your blood sugar is not greater than 70 mg/dL on recheck, call [phone] for further instructions. Report your blood sugar to the short stay nurse when you get to Short Stay.  If you are admitted to the hospital after surgery: Your blood sugar will be checked by the staff and you will probably be given insulin  after surgery (instead of oral diabetes medicines) to  make sure you have good blood sugar levels. The goal for blood sugar control after surgery is 80-180 mg/dL.                      Do NOT Smoke (Tobacco/Vaping) for 24 hours prior to your procedure.  If you use a CPAP at night, you may bring your mask/headgear for your overnight stay.   You will be asked to remove any contacts, glasses, piercing's, hearing aid's, dentures/partials prior to surgery. Please bring cases  for these items if needed.    Patients discharged the day of surgery will not be allowed to drive home, and someone needs to stay with them for 24 hours.  SURGICAL WAITING ROOM VISITATION Patients may have no more than 2 support people in the waiting area - these visitors may rotate.   Pre-op nurse will coordinate an appropriate time for 1 ADULT support person, who may not rotate, to accompany patient in pre-op.  Children under the age of 57 must have an adult with them who is not the patient and must remain in the main waiting area with an adult.  If the patient needs to stay at the hospital during part of their recovery, the visitor guidelines for inpatient rooms apply.  Please refer to the [facility] website for the visitor guidelines for any additional information.   If you received a COVID test during your pre-op visit  it is requested that you wear a mask when out in public, stay away from anyone that may not be feeling well and notify your surgeon if you develop symptoms. If you have been in contact with anyone that has tested positive in the last 10 days please notify you surgeon.      Pre-operative CHG Bathing Instructions   You can play a key role in reducing the risk of infection after surgery. Your skin needs to be as free of germs as possible. You can reduce the number of germs on your skin by washing with CHG (chlorhexidine  gluconate) soap before surgery. CHG is an antiseptic soap that kills germs and continues to kill germs even after washing.   DO NOT use if you have an allergy to chlorhexidine /CHG or antibacterial soaps. If your skin becomes reddened or irritated, stop using the CHG and notify one of our RNs at [phone].              TAKE A SHOWER THE NIGHT BEFORE SURGERY AND THE DAY OF SURGERY    Please keep in mind the following:  You may shave your face before/day of surgery.  Place clean sheets on your bed the night before surgery Use a clean washcloth (not  used since being washed) for each shower. DO NOT sleep with pet's night before surgery.  CHG Shower Instructions:  Wash your face and private area with normal soap. If you choose to wash your hair, wash first with your normal shampoo.  After you use shampoo/soap, rinse your hair and body thoroughly to remove shampoo/soap residue.  Turn the water OFF and apply half the bottle of CHG soap to a CLEAN washcloth.  Apply CHG soap ONLY FROM YOUR NECK DOWN TO YOUR TOES (washing for 3-5 minutes)  DO NOT use CHG soap on face, private areas, open wounds, or sores.  Pay special attention to the area where your surgery is being performed.  If you are having back surgery, having someone wash your back for you may be helpful. Wait 2 minutes  after CHG soap is applied, then you may rinse off the CHG soap.  Pat dry with a clean towel  Put on clean pajamas    Additional instructions for the day of surgery: DO NOT APPLY any lotions, deodorants or cologne.   Do not wear jewelry  Do not bring valuables to the hospital. [facility] is not responsible for valuables/personal belongings. Put on clean/comfortable clothes.  Please brush your teeth.  Ask your nurse before applying any prescription medications to the skin.

## 2024-06-20 NOTE — Progress Notes (Incomplete)
 PCP - Dr Cecille Skiff & [facility] Cardiologist - Dr Vishnu Mallipeddi  Chest x-ray - 06/21/24 EKG - 06/21/24 Stress Test - 03/21/24 CE ECHO - 01/07/24 Cardiac Cath - 05/24/24  ICD Pacemaker/Loop - n/a  Sleep Study -  n/a  Diabetes Type 2 Fasting Blood Sugar -  Checks Blood Sugar _____ times a day  Hold Jardiance  for 3 days prior to procedure per MD.  Last dose was on 06/19/24.   Hold Metformin for 2 days prior to procedure per MD.  Last dose was on 06/20/24.    THE MORNING OF SURGERY, take 1/2 of your regular Lantus  Insulin  dose 8-9 Units   If your blood sugar is less than 70 mg/dL, you will need to treat for low blood sugar: Treat a low blood sugar (less than 70 mg/dL) with  cup of clear juice (cranberry or apple), 4 glucose tablets, OR glucose gel. Recheck blood sugar in 15 minutes after treatment (to make sure it is greater than 70 mg/dL). If your blood sugar is not greater than 70 mg/dL on recheck, call [phone] for further instructions.  Blood Thinner Instructions:  n/a  Aspirin  Instructions: Continue Aspirin  but do not take on DOS. .  NPO  Anesthesia review: Yes  STOP now taking any Aspirin  (unless otherwise instructed by your surgeon), Aleve, Naproxen, Ibuprofen , Voltaren, Motrin , Advil , Goody's, BC's, all herbal medications, fish oil, and all vitamins.   Coronavirus Screening Do you have any of the following symptoms:  Cough yes/no: No Fever (>100.24F)  yes/no: No Runny nose yes/no: No Sore throat yes/no: No Difficulty breathing/shortness of breath  yes/no: No  Have you traveled in the last 14 days and where? yes/no: No  Patient verbalized understanding of instructions that were given to them at the PAT appointment. Patient was also instructed that they will need to review over the PAT instructions again at home before surgery.

## 2024-06-21 ENCOUNTER — Ambulatory Visit (HOSPITAL_COMMUNITY)
Admission: RE | Admit: 2024-06-21 | Discharge: 2024-06-21 | Disposition: A | Discharge status: Home / Self Care | Source: Ambulatory Visit | Attending: Surgery | Admitting: Surgery

## 2024-06-21 ENCOUNTER — Other Ambulatory Visit: Payer: Self-pay

## 2024-06-21 ENCOUNTER — Inpatient Hospital Stay (HOSPITAL_COMMUNITY)
Admission: RE | Admit: 2024-06-21 | Discharge: 2024-06-21 | Disposition: A | Discharge status: Home / Self Care | Source: Ambulatory Visit | Attending: Surgery

## 2024-06-21 ENCOUNTER — Encounter (HOSPITAL_COMMUNITY): Payer: Self-pay

## 2024-06-21 ENCOUNTER — Encounter (HOSPITAL_COMMUNITY)

## 2024-06-21 VITALS — BP 142/83 | HR 75 | Temp 97.7°F | Resp 16 | Ht 71.0 in | Wt 178.0 lb

## 2024-06-21 DIAGNOSIS — Z01818 Encounter for other preprocedural examination: Secondary | ICD-10-CM | POA: Insufficient documentation

## 2024-06-21 DIAGNOSIS — I350 Nonrheumatic aortic (valve) stenosis: Secondary | ICD-10-CM | POA: Insufficient documentation

## 2024-06-21 DIAGNOSIS — I2510 Atherosclerotic heart disease of native coronary artery without angina pectoris: Secondary | ICD-10-CM | POA: Insufficient documentation

## 2024-06-21 HISTORY — DX: Hypothyroidism, unspecified: E03.9

## 2024-06-21 HISTORY — DX: Unspecified osteoarthritis, unspecified site: M19.90

## 2024-06-21 HISTORY — DX: Unspecified hearing loss, unspecified ear: H91.90

## 2024-06-21 HISTORY — DX: Sleep apnea, unspecified: G47.30

## 2024-06-21 HISTORY — DX: Other psychoactive substance abuse, uncomplicated: F19.10

## 2024-06-21 LAB — COMPREHENSIVE METABOLIC PANEL WITH GFR
ALT: 22 U/L (ref 0–44)
AST: 19 U/L (ref 15–41)
Albumin: 3.6 g/dL (ref 3.5–5.0)
Alkaline Phosphatase: 66 U/L (ref 38–126)
Anion gap: 10 (ref 5–15)
BUN: 15 mg/dL (ref 8–23)
CO2: 27 mmol/L (ref 22–32)
Calcium: 9.6 mg/dL (ref 8.9–10.3)
Chloride: 99 mmol/L (ref 98–111)
Creatinine, Ser: 1.01 mg/dL (ref 0.61–1.24)
GFR, Estimated: >60 mL/min (ref >60)
Glucose, Bld: 346 mg/dL — ABNORMAL HIGH (ref 70–99)
Potassium: 4.6 mmol/L (ref 3.5–5.1)
Sodium: 136 mmol/L (ref 135–145)
Total Bilirubin: 0.4 mg/dL (ref 0.0–1.2)
Total Protein: 7.5 g/dL (ref 6.5–8.1)

## 2024-06-21 LAB — URINALYSIS, ROUTINE W REFLEX MICROSCOPIC
Bacteria, UA: NONE SEEN
Bilirubin Urine: NEGATIVE
Glucose, UA: >=500 mg/dL — AB
Hgb urine dipstick: NEGATIVE
Ketones, ur: NEGATIVE mg/dL
Leukocytes,Ua: NEGATIVE
Nitrite: NEGATIVE
Protein, ur: NEGATIVE mg/dL
Specific Gravity, Urine: 1.028 (ref 1.005–1.030)
pH: 6 (ref 5.0–8.0)

## 2024-06-21 LAB — CBC
HCT: 47.8 % (ref 39.0–52.0)
Hemoglobin: 15.7 g/dL (ref 13.0–17.0)
MCH: 28.9 pg (ref 26.0–34.0)
MCHC: 32.8 g/dL (ref 30.0–36.0)
MCV: 87.9 fL (ref 80.0–100.0)
Platelets: 200 K/uL (ref 150–400)
RBC: 5.44 MIL/uL (ref 4.22–5.81)
RDW: 12.4 % (ref 11.5–15.5)
WBC: 6.9 K/uL (ref 4.0–10.5)
nRBC: 0 % (ref 0.0–0.2)

## 2024-06-21 LAB — SURGICAL PCR SCREEN
MRSA, PCR: NEGATIVE
Staphylococcus aureus: NEGATIVE

## 2024-06-21 LAB — GLUCOSE, CAPILLARY: Glucose-Capillary: 347 mg/dL — ABNORMAL HIGH (ref 70–99)

## 2024-06-21 LAB — TYPE AND SCREEN
ABO/RH(D): B POS
Antibody Screen: NEGATIVE

## 2024-06-21 LAB — HEMOGLOBIN A1C
Hgb A1c MFr Bld: 13.9 % — ABNORMAL HIGH (ref 4.8–5.6)
Mean Plasma Glucose: 352.23 mg/dL

## 2024-06-21 LAB — VAS US DOPPLER PRE CABG
Left ABI: 0.99
Right ABI: 1.13

## 2024-06-21 LAB — PROTIME-INR
INR: 1 (ref 0.8–1.2)
Prothrombin Time: 13.3 s (ref 11.4–15.2)

## 2024-06-21 LAB — APTT: aPTT: 29 s (ref 24–36)

## 2024-06-21 NOTE — Progress Notes (Signed)
 [employer] message sent to Dr Lucas to inform him of UA abnormal results.

## 2024-06-22 MED ORDER — CEFAZOLIN SODIUM-DEXTROSE 2-4 GM/100ML-% IV SOLN
2.0000 g | INTRAVENOUS | Status: AC
  Administered 2024-06-23: 2 g via INTRAVENOUS
  Filled 2024-06-22 (×2): qty 100

## 2024-06-22 MED ORDER — POTASSIUM CHLORIDE 2 MEQ/ML IV SOLN
80.0000 meq | INTRAVENOUS | Status: DC
  Filled 2024-06-22: qty 40

## 2024-06-22 MED ORDER — TRANEXAMIC ACID (OHS) PUMP PRIME SOLUTION
2.0000 mg/kg | INTRAVENOUS | Status: DC
  Filled 2024-06-22 (×2): qty 1.61

## 2024-06-22 MED ORDER — TRANEXAMIC ACID 1000 MG/10ML IV SOLN
1.5000 mg/kg/h | INTRAVENOUS | Status: AC
  Administered 2024-06-23: 1.5 mg/kg/h via INTRAVENOUS
  Filled 2024-06-22 (×2): qty 25

## 2024-06-22 MED ORDER — PHENYLEPHRINE HCL-NACL 20-0.9 MG/250ML-% IV SOLN
30.0000 ug/min | INTRAVENOUS | Status: AC
  Administered 2024-06-23: 30 ug/min via INTRAVENOUS
  Filled 2024-06-22 (×2): qty 250

## 2024-06-22 MED ORDER — CEFAZOLIN SODIUM-DEXTROSE 2-4 GM/100ML-% IV SOLN
2.0000 g | INTRAVENOUS | Status: AC
  Administered 2024-06-23: 2 g via INTRAVENOUS
  Filled 2024-06-22 (×3): qty 100

## 2024-06-22 MED ORDER — PLASMA-LYTE A IV SOLN
INTRAVENOUS | Status: DC
  Filled 2024-06-22 (×2): qty 2.5

## 2024-06-22 MED ORDER — NITROGLYCERIN IN D5W 200-5 MCG/ML-% IV SOLN
2.0000 ug/min | INTRAVENOUS | Status: DC
  Filled 2024-06-22 (×2): qty 250

## 2024-06-22 MED ORDER — NOREPINEPHRINE 4 MG/250ML-% IV SOLN
0.0000 ug/min | INTRAVENOUS | Status: AC
  Administered 2024-06-23: 2 ug/min via INTRAVENOUS
  Filled 2024-06-22: qty 250

## 2024-06-22 MED ORDER — INSULIN REGULAR(HUMAN) IN NACL 100-0.9 UT/100ML-% IV SOLN
INTRAVENOUS | Status: AC
  Administered 2024-06-23: 16 [IU]/h via INTRAVENOUS
  Filled 2024-06-22 (×2): qty 100

## 2024-06-22 MED ORDER — MILRINONE LACTATE IN DEXTROSE 20-5 MG/100ML-% IV SOLN
0.3000 ug/kg/min | INTRAVENOUS | Status: DC
  Filled 2024-06-22 (×2): qty 100

## 2024-06-22 MED ORDER — HEPARIN 30,000 UNITS/1000 ML (OHS) CELLSAVER SOLUTION
Status: DC
  Filled 2024-06-22 (×2): qty 1000

## 2024-06-22 MED ORDER — MAGNESIUM SULFATE 50 % IJ SOLN
40.0000 meq | INTRAMUSCULAR | Status: DC
  Filled 2024-06-22 (×2): qty 9.85

## 2024-06-22 MED ORDER — EPINEPHRINE HCL 5 MG/250ML IV SOLN IN NS
0.0000 ug/min | INTRAVENOUS | Status: DC
  Filled 2024-06-22 (×2): qty 250

## 2024-06-22 MED ORDER — DEXMEDETOMIDINE HCL IN NACL 400 MCG/100ML IV SOLN
0.1000 ug/kg/h | INTRAVENOUS | Status: AC
  Administered 2024-06-23: .4 ug/kg/h via INTRAVENOUS
  Filled 2024-06-22 (×2): qty 100

## 2024-06-22 MED ORDER — VANCOMYCIN HCL 1250 MG/250ML IV SOLN
1250.0000 mg | INTRAVENOUS | Status: AC
  Administered 2024-06-23: 1250 mg via INTRAVENOUS
  Filled 2024-06-22 (×2): qty 250

## 2024-06-22 MED ORDER — TRANEXAMIC ACID (OHS) BOLUS VIA INFUSION
15.0000 mg/kg | INTRAVENOUS | Status: AC
  Administered 2024-06-23: 1210.5 mg via INTRAVENOUS
  Filled 2024-06-22 (×3): qty 1211

## 2024-06-22 NOTE — Anesthesia Preprocedure Evaluation (Signed)
 Anesthesia Evaluation  Patient identified by MRN, date of birth, ID band Patient awake    Reviewed: Allergy & Precautions, NPO status , Patient's Chart, lab work & pertinent test results  History of Anesthesia Complications Negative for: history of anesthetic complications  Airway Mallampati: II  TM Distance: >3 FB Neck ROM: Full    Dental  (+) Edentulous Upper, Missing, Dental Advisory Given   Pulmonary sleep apnea (does not use CPAP) , COPD, Patient abstained from smoking., former smoker   breath sounds clear to auscultation       Cardiovascular hypertension, Pt. on medications + angina  + CAD (3v ASCAD)  + Valvular Problems/Murmurs AS  Rhythm:Regular Rate:Normal + Systolic murmurs 04/7973 ECHO: EF 40-45%, with mildly decreased LVF, mild LVH, normal RVF, severe AS with mod AI, mean gradient 40 mmHg, trivial MR   Neuro/Psych negative neurological ROS     GI/Hepatic negative GI ROS,,,(+)     substance abuse  cocaine use  Endo/Other  diabetes (glu 349), Insulin  DependentHypothyroidism    Renal/GU Renal disease     Musculoskeletal   Abdominal   Peds  Hematology Hb 15.7, plt 200k   Anesthesia Other Findings   Reproductive/Obstetrics                              Anesthesia Physical Anesthesia Plan  ASA: 4  Anesthesia Plan: General   Post-op Pain Management:    Induction: Intravenous  PONV Risk Score and Plan: 2 and Treatment may vary due to age or medical condition  Airway Management Planned: Oral ETT  Additional Equipment: Arterial line, PA Cath, TEE and Ultrasound Guidance Line Placement  Intra-op Plan:   Post-operative Plan: Post-operative intubation/ventilation  Informed Consent: I have reviewed the patients History and Physical, chart, labs and discussed the procedure including the risks, benefits and alternatives for the proposed anesthesia with the patient or authorized  representative who has indicated his/her understanding and acceptance.     Dental advisory given  Plan Discussed with: CRNA and Surgeon  Anesthesia Plan Comments:          Anesthesia Quick Evaluation

## 2024-06-22 NOTE — H&P (Signed)
 [address] [place] [address]             [phone]    Cardiothoracic Surgery Consultation     PCP is Jess Llano, MD Referring Provider is Wonda Sharper, MD       Chief Complaint  Patient presents with   Severe aortic stenosis and multi-vessel coronary artery disease          HPI:   The patient is a 73 year old gentleman with a history of type 2 diabetes, hypertension, smoking and COPD, chronic kidney disease, chronic systolic heart failure, aortic stenosis and aortic insufficiency who was therefore to the structural heart valve clinic for TAVR evaluation.  The majority of his care has been through the [facility] system.  He showed me a lab report from the [facility] recently that showed his hemoglobin A1c was 14.5.  He was recently started on insulin  for that.  His most recent echocardiogram in our system was in March 2025 and showed a moderately reduced left ventricular ejection fraction 40 to 45% with a mean transvalvular gradient of 40 mmHg and a valve area of 0.78 cm with a dimensionless index of 0.24.  There is moderate aortic insufficiency.  He reports exertional fatigue and shortness of breath over the past year as well as episodes of exertional substernal chest pain which are occurring more frequently and more severe.  He denies orthopnea and peripheral edema.  He has had occasional episodes of dizziness.   He has a history of smoking and cocaine use but said that he has not used either in 3 or 4 months.  He has full upper dentures and partial lower dentures.  He is here today with his brother and sister-in-law.     Past Medical History:  Diagnosis Date   Aortic atherosclerosis (HCC)     Chronic kidney disease     Chronic systolic heart failure (HCC)     COPD (chronic obstructive pulmonary disease) (HCC)     Diabetes mellitus     Hypertension     Thyroid disease                 Past Surgical History:  Procedure Laterality Date   LIVER BIOPSY        ORIF ACETABULAR FRACTURE Right 05/19/2023    Procedure: OPEN REDUCTION INTERNAL FIXATION (ORIF) ACETABULAR FRACTURE;  Surgeon: Celena Sharper, MD;  Location: [facility] OR;  Service: Orthopedics;  Laterality: Right;   RIGHT HEART CATH AND CORONARY ANGIOGRAPHY N/A 05/24/2024    Procedure: RIGHT HEART CATH AND CORONARY ANGIOGRAPHY;  Surgeon: Wonda Sharper, MD;  Location: [facility] INVASIVE CV LAB;  Service: Cardiovascular;  Laterality: N/A;          History reviewed. No pertinent family history.       Social History Social History  Social History         Tobacco Use   Smoking status: Former      Current packs/day: 0.00      Average packs/day: 0.5 packs/day for 55.3 years (27.7 ttl pk-yrs)      Types: Cigarettes      Start date: 5      Quit date: 03/03/2024      Years since quitting: 0.2   Smokeless tobacco: Never  Vaping Use   Vaping status: Never Used  Substance Use Topics   Alcohol  use: Yes      Comment: occassinally   Drug use: Yes  Types: Cocaine              Current Outpatient Medications  Medication Sig Dispense Refill   aspirin  EC 81 MG tablet Take 1 tablet (81 mg total) by mouth daily. Swallow whole.       Cholecalciferol  (VITAMIN D ) 125 MCG (5000 UT) CAPS Take 1 capsule by mouth daily. 30 capsule 6   diclofenac Sodium (VOLTAREN) 1 % GEL Apply 2 g topically 3 (three) times daily as needed (pain).       empagliflozin  (JARDIANCE ) 25 MG TABS tablet Take 25 mg by mouth daily.       fluticasone  (FLONASE ) 50 MCG/ACT nasal spray Place 2 sprays into both nostrils daily.       levothyroxine  (SYNTHROID ) 137 MCG tablet Take 137 mcg by mouth daily before breakfast.       metFORMIN (GLUCOPHAGE) 500 MG tablet Take 1 tablet (500 mg total) by mouth daily.       Multiple Vitamin (MULTIVITAMIN) tablet Take 1 tablet by mouth daily.       polyethylene glycol powder (MIRALAX ) 17 GM/SCOOP powder Take 17 g by mouth 2 (two) times daily as needed for moderate constipation.       simvastatin   (ZOCOR ) 10 MG tablet Take 1 tablet (10 mg total) by mouth daily.       tamsulosin  (FLOMAX ) 0.4 MG CAPS capsule Take 0.4 mg by mouth at bedtime as needed (Prostate). (Patient taking differently: Take 0.4 mg by mouth 2 (two) times daily.)          No current facility-administered medications for this visit.        Allergies  No Known Allergies     Review of Systems  Constitutional:  Positive for activity change and fatigue.  HENT: Negative.    Eyes: Negative.   Respiratory:  Positive for shortness of breath.   Cardiovascular:  Positive for chest pain. Negative for leg swelling.  Gastrointestinal: Negative.   Endocrine: Negative.   Genitourinary: Negative.   Musculoskeletal: Negative.   Skin: Negative.   Allergic/Immunologic: Negative.   Neurological:  Positive for dizziness. Negative for syncope.  Hematological: Negative.   Psychiatric/Behavioral: Negative.       BP (!) 150/76 (BP Location: Left Arm)   Pulse 85   Resp 18   Ht 5' 11 (1.803 m)   Wt 178 lb (80.7 kg)   SpO2 94%   BMI 24.83 kg/m  Physical Exam Constitutional:      Appearance: Normal appearance. He is normal weight.  HENT:     Head: Normocephalic and atraumatic.     Mouth/Throat:     Mouth: Mucous membranes are moist.     Pharynx: Oropharynx is clear.  Eyes:     Extraocular Movements: Extraocular movements intact.     Conjunctiva/sclera: Conjunctivae normal.     Pupils: Pupils are equal, round, and reactive to light.  Neck:     Comments: Transmitted murmur to both sides of the neck Cardiovascular:     Rate and Rhythm: Normal rate and regular rhythm.     Pulses: Normal pulses.     Heart sounds: Murmur heard.     Comments: 3/6 systolic murmur along the right sternal border.  There is no diastolic murmur. Pulmonary:     Effort: Pulmonary effort is normal.     Breath sounds: Normal breath sounds.  Abdominal:     General: There is no distension.     Tenderness: There is no abdominal tenderness.   Musculoskeletal:  General: No swelling.     Cervical back: Normal range of motion and neck supple.  Skin:    General: Skin is warm and dry.  Neurological:     General: No focal deficit present.     Mental Status: He is alert and oriented to person, place, and time.  Psychiatric:        Mood and Affect: Mood normal.        Behavior: Behavior normal.         Diagnostic Tests:   ECHOCARDIOGRAM REPORT       Patient Name:   Gordon Robinson Date of Exam: 01/07/2024  Medical Rec #:  [id number]   Height:       71.0 in  Accession #:    [id number]  Weight:       162.0 lb  Date of Birth:  02-Jul-1951    BSA:          1.928 m  Patient Age:    72 years    BP:           131/79 mmHg  Patient Gender: M           HR:           71 bpm.  Exam Location:  [name]   Procedure: 2D Echo, Cardiac Doppler, Color Doppler, Strain Analysis and 3D  Echo            (Both Spectral and Color Flow Doppler were utilized during             procedure).   Indications:    Cardiomyopathy [facility]) [[id number]]                  Severe aortic stenosis [[id number]]    History:        Patient has no prior history of Echocardiogram  examinations.                 Cardiomyopathy and CHF, COPD, Aortic Valve Disease,                  Signs/Symptoms:Murmur; Risk Factors:Current Smoker,                  Dyslipidemia, Diabetes and Hypertension.    Sonographer:    [name]  Referring Phys: [id number] VISHNU P MALLIPEDDI     Sonographer Comments: Global longitudinal strain was attempted.  IMPRESSIONS     1. Left ventricular ejection fraction, by estimation, is 40 to 45%. Left  ventricular ejection fraction by 3D volume is 42 %. The left ventricle has  mildly decreased function. The left ventricle demonstrates global  hypokinesis. There is moderate left  ventricular hypertrophy. Left ventricular diastolic parameters are  consistent with Grade I diastolic dysfunction (impaired relaxation).  Elevated left ventricular  end-diastolic pressure. The average left  ventricular global longitudinal strain is -11.2 %.  The global longitudinal strain is abnormal.   2. Right ventricular systolic function is normal. The right ventricular  size is normal. There is normal pulmonary artery systolic pressure.   3. The mitral valve is normal in structure. Trivial mitral valve  regurgitation. No evidence of mitral stenosis.   4. The aortic valve is tricuspid. There is severe calcifcation of the  aortic valve. Aortic valve regurgitation is moderate. Severe aortic valve  stenosis. Aortic regurgitation PHT measures 381 msec. Aortic valve area,  by VTI measures 0.78 cm. Aortic  valve mean gradient measures 40.0 mmHg. Aortic valve Vmax measures 3.87  m/s.  DVI is 0.24.   5. The inferior vena cava is normal in size with greater than 50%  respiratory variability, suggesting right atrial pressure of 3 mmHg.   Comparison(s): No prior Echocardiogram.   FINDINGS   Left Ventricle: Left ventricular ejection fraction, by estimation, is 40  to 45%. Left ventricular ejection fraction by 3D volume is 42 %. The left  ventricle has mildly decreased function. The left ventricle demonstrates  global hypokinesis. The average  left ventricular global longitudinal strain is -11.2 %. Strain was  performed and the global longitudinal strain is abnormal. The left  ventricular internal cavity size was normal in size. There is moderate  left ventricular hypertrophy. Left ventricular  diastolic parameters are consistent with Grade I diastolic dysfunction  (impaired relaxation). Elevated left ventricular end-diastolic pressure.   Right Ventricle: The right ventricular size is normal. No increase in  right ventricular wall thickness. Right ventricular systolic function is  normal. There is normal pulmonary artery systolic pressure. The tricuspid  regurgitant velocity is 2.73 m/s, and   with an assumed right atrial pressure of 3 mmHg, the  estimated right  ventricular systolic pressure is 32.8 mmHg.   Left Atrium: Left atrial size was normal in size.   Right Atrium: Right atrial size was normal in size.   Pericardium: There is no evidence of pericardial effusion.   Mitral Valve: The mitral valve is normal in structure. Trivial mitral  valve regurgitation. No evidence of mitral valve stenosis.   Tricuspid Valve: The tricuspid valve is normal in structure. Tricuspid  valve regurgitation is trivial. No evidence of tricuspid stenosis.   Aortic Valve: The aortic valve is tricuspid. There is severe calcifcation  of the aortic valve. Aortic valve regurgitation is moderate. Aortic  regurgitation PHT measures 381 msec. Severe aortic stenosis is present.  Aortic valve mean gradient measures 40.0   mmHg. Aortic valve peak gradient measures 59.9 mmHg. Aortic valve area,  by VTI measures 0.78 cm.   Pulmonic Valve: The pulmonic valve was normal in structure. Pulmonic valve  regurgitation is not visualized. No evidence of pulmonic stenosis.   Aorta: The aortic root is normal in size and structure.   Venous: The inferior vena cava is normal in size with greater than 50%  respiratory variability, suggesting right atrial pressure of 3 mmHg.   IAS/Shunts: No atrial level shunt detected by color flow Doppler.   Additional Comments: 3D was performed not requiring image post processing  on an independent workstation and was abnormal.     LEFT VENTRICLE  PLAX 2D  LVIDd:         5.20 cm         Diastology  LVIDs:         4.20 cm         LV e' medial:    5.77 cm/s  LV PW:         1.50 cm         LV E/e' medial:  20.0  LV IVS:        1.20 cm         LV e' lateral:   5.82 cm/s  LVOT diam:     2.00 cm         LV E/e' lateral: 19.8  LV SV:         73  LV SV Index:   38              2D Longitudinal  LVOT Area:  3.14 cm        Strain                                 2D Strain GLS   -11.2 %                                 Avg:  LV  Volumes (MOD)  LV vol d, MOD    123.0 ml      3D Volume EF  A2C:                           LV 3D EF:    Left  LV vol d, MOD    158.0 ml                   ventricul  A4C:                                        ar  LV vol s, MOD    73.9 ml                    ejection  A2C:                                        fraction  LV vol s, MOD    85.9 ml                    by 3D  A4C:                                        volume is  LV SV MOD A2C:   49.1 ml                    42 %.  LV SV MOD A4C:   158.0 ml  LV SV MOD BP:    56.1 ml                                 3D Volume EF:                                 3D EF:        42 %                                 LV EDV:       259 ml                                 LV ESV:       149 ml  LV SV:        110 ml   RIGHT VENTRICLE  RV S prime:     11.00 cm/s  TAPSE (M-mode): 2.1 cm   LEFT ATRIUM             Index        RIGHT ATRIUM           Index  LA diam:        4.00 cm 2.07 cm/m   RA Area:     17.60 cm  LA Vol (A2C):   66.6 ml 34.54 ml/m  RA Volume:   50.50 ml  26.19 ml/m  LA Vol (A4C):   51.9 ml 26.92 ml/m  LA Biplane Vol: 63.5 ml 32.94 ml/m   AORTIC VALVE  AV Area (Vmax):    0.75 cm  AV Area (Vmean):   0.66 cm  AV Area (VTI):     0.78 cm  AV Vmax:           387.00 cm/s  AV Vmean:          298.000 cm/s  AV VTI:            0.931 m  AV Peak Grad:      59.9 mmHg  AV Mean Grad:      40.0 mmHg  LVOT Vmax:         92.40 cm/s  LVOT Vmean:        63.000 cm/s  LVOT VTI:          0.232 m  LVOT/AV VTI ratio: 0.25  AI PHT:            381 msec    AORTA  Ao Root diam: 3.50 cm   MITRAL VALVE                TRICUSPID VALVE  MV Area (PHT): 3.10 cm     TR Peak grad:   29.8 mmHg  MV Decel Time: 245 msec     TR Vmax:        273.00 cm/s  MV E velocity: 115.50 cm/s  MV A velocity: 98.75 cm/s   SHUNTS  MV E/A ratio:  1.17         Systemic VTI:  0.23 m                              Systemic Diam: 2.00 cm    Vishnu Priya Mallipeddi  Electronically signed by Diannah Late Mallipeddi  Signature Date/Time: 01/07/2024/2:34:38 PM        Final        Physicians   Panel Physicians Referring Physician Case Authorizing Physician  Wonda Sharper, MD (Primary)        Procedures   RIGHT HEART CATH AND CORONARY ANGIOGRAPHY    Conclusion   1.  Known severe aortic stenosis by noninvasive assessment.  Aortic valve is calcified and restricted on plain fluoroscopy. 2.  Three-vessel coronary artery disease with nonobstructive stenosis of the dominant RCA, severe stenosis of the large first OM branch of the circumflex, and severe bifurcation stenosis of the LAD/first diagonal 3.  Right heart hemodynamics: RA a mean 3 mmHg RV 34/5 mmHg PA 28/12 mean 19 mmHg Cardiac output 4.7 L/min Cardiac index 2.35 L/min/m   Recommendations: Cardiac surgical consultation for consideration of aortic valve replacement/CABG   Coronary Findings   Diagnostic Dominance: Right Left Anterior Descending  Prox LAD to Mid LAD  lesion is 80% stenosed.    First Diagonal Branch  1st Diag lesion is 70% stenosed.    Left Circumflex  Mid Cx lesion is 50% stenosed.    First Obtuse Marginal Branch  1st Mrg lesion is 75% stenosed.    Right Coronary Artery  There is mild diffuse disease throughout the vessel. Dominant vessel, mild to moderate mid vessel stenosis noted. The distal vessel is widely patent. The PDA is patent with mild diffuse plaquing.  Prox RCA lesion is 50% stenosed.  Mid RCA lesion is 50% stenosed.    Right Posterior Descending Artery  RPDA lesion is 40% stenosed.    Intervention    No interventions have been documented.    Left Heart   Aortic Valve There is severe aortic valve stenosis. The aortic valve is calcified. There is restricted aortic valve motion.    Coronary Diagrams   Diagnostic Dominance: Right    Hemo Data   Flowsheet Row Most Recent Value  Fick Cardiac Output 4.67 L/min   Fick Cardiac Output Index 2.35 (L/min)/BSA  RA A Wave 6 mmHg  RA V Wave 3 mmHg  RA Mean 3 mmHg  RV Systolic Pressure 34 mmHg  RV Diastolic Pressure -2 mmHg  RV EDP 5 mmHg  PA Systolic Pressure 28 mmHg  PA Diastolic Pressure 12 mmHg  PA Mean 19 mmHg  AO Systolic Pressure 93 mmHg  AO Diastolic Pressure 61 mmHg  AO Mean 75 mmHg  QP/QS 1  TPVR Index 8.08 HRUI  TSVR Index 33.61 HRUI  TPVR/TSVR Ratio 0.24    Narrative & Impression  EXAM: CTA CHEST AORTA 05/31/2024 01:42:04 PM   TECHNIQUE: CTA of the chest was performed after the administration of intravenous contrast. Multiplanar reformatted images are provided for review. MIP images are provided for review. Automated exposure control, iterative reconstruction, and/or weight based adjustment of the mA/kV was utilized to reduce the radiation dose to as low as reasonably achievable.   COMPARISON: 12/25/2022   CLINICAL HISTORY: Aortic valve replacement (TAVR), pre-op eval.   FINDINGS:   AORTA: Ascending thoracic aorta measures 3.7 cm in diameter, aortic arch 3.1 cm, and descending thoracic aorta 2.8 cm. No thoracic aortic dissection.   MEDIASTINUM: Coarse aortic valve calcifications. Scattered LAD coronary calcifications. Bovine variant brachiocephalic arterial anatomy. No mediastinal lymphadenopathy. The heart and pericardium demonstrate no acute abnormality.   LYMPH NODES: No mediastinal, hilar or axillary lymphadenopathy.   LUNGS AND PLEURA: no evidence of pulmonary embolus. Pulmonary emphysema. Pleural-based 1.3 cm nodule in the superior segment right lower lobe (image 310 sequence 303), stable since PET CT 11/07/2021. No focal consolidation or pulmonary edema. No pleural effusion or pneumothorax.   UPPER ABDOMEN: Limited images of the upper abdomen are unremarkable.   SOFT TISSUES AND BONES: No acute bone or soft tissue abnormality.   IMPRESSION: 1. No acute abnormality of the aorta. 2. Stable  pleural-based 1.3 cm nodule in the superior segment right lower lobe since PET CT 11/07/2021.   Electronically signed by: Katheleen Faes MD 06/05/2024 08:30 AM EDT RP Workstation: [id number]          Impression:   This 73 year old gentleman has stage D, severe, symptomatic aortic stenosis and moderate aortic insufficiency with NYHA class II symptoms of exertional fatigue and shortness of breath as well as exertional angina.  His catheterization shows severe multivessel coronary disease.  I agree that open surgical aortic valve replacement and coronary bypass graft surgery is the best treatment for relief of his symptoms and to  prevent further myocardial loss and left ventricular dysfunction. I discussed the operative procedure with the patient and family including alternatives, benefits and risks; including but not limited to bleeding, blood transfusion, infection, stroke, myocardial infarction, graft failure, heart block requiring a permanent pacemaker, organ dysfunction, and death.  Fairy JONETTA Buddle understands and agrees to proceed.   He has poorly controlled diabetes with a recent hemoglobin A1c of 14.5 and was subsequently started on insulin .  He is monitoring his glucose at home and has follow-up with his primary physician in a few weeks.   Plan:   Aortic valve replacement using a bioprosthetic valve and coronary bypass graft surgery.     Dorise MARLA Fellers, MD [facility] [phone]

## 2024-06-23 ENCOUNTER — Other Ambulatory Visit: Payer: Self-pay

## 2024-06-23 ENCOUNTER — Encounter (HOSPITAL_COMMUNITY): Admitting: Vascular Surgery

## 2024-06-23 ENCOUNTER — Inpatient Hospital Stay (HOSPITAL_COMMUNITY)

## 2024-06-23 ENCOUNTER — Other Ambulatory Visit: Payer: Self-pay | Admitting: Cardiology

## 2024-06-23 ENCOUNTER — Inpatient Hospital Stay (HOSPITAL_COMMUNITY): Admitting: Certified Registered Nurse Anesthetist

## 2024-06-23 ENCOUNTER — Inpatient Hospital Stay (HOSPITAL_COMMUNITY)
Admission: RE | Admit: 2024-06-23 | Discharge: 2024-06-28 | DRG: 220 | Disposition: A | Discharge status: Home Health | Attending: Surgery | Admitting: Surgery

## 2024-06-23 ENCOUNTER — Encounter (HOSPITAL_COMMUNITY): Payer: Self-pay | Admitting: Surgery

## 2024-06-23 ENCOUNTER — Inpatient Hospital Stay (HOSPITAL_COMMUNITY)
Admission: RE | Disposition: A | Discharge status: Home Health | Payer: Self-pay | Source: Home / Self Care | Attending: Surgery

## 2024-06-23 DIAGNOSIS — Z7982 Long term (current) use of aspirin: Secondary | ICD-10-CM

## 2024-06-23 DIAGNOSIS — Z972 Presence of dental prosthetic device (complete) (partial): Secondary | ICD-10-CM

## 2024-06-23 DIAGNOSIS — E782 Mixed hyperlipidemia: Secondary | ICD-10-CM | POA: Diagnosis present

## 2024-06-23 DIAGNOSIS — E039 Hypothyroidism, unspecified: Secondary | ICD-10-CM | POA: Diagnosis present

## 2024-06-23 DIAGNOSIS — F17210 Nicotine dependence, cigarettes, uncomplicated: Secondary | ICD-10-CM | POA: Diagnosis not present

## 2024-06-23 DIAGNOSIS — I2510 Atherosclerotic heart disease of native coronary artery without angina pectoris: Secondary | ICD-10-CM

## 2024-06-23 DIAGNOSIS — I429 Cardiomyopathy, unspecified: Secondary | ICD-10-CM | POA: Diagnosis present

## 2024-06-23 DIAGNOSIS — H9190 Unspecified hearing loss, unspecified ear: Secondary | ICD-10-CM | POA: Diagnosis present

## 2024-06-23 DIAGNOSIS — Z952 Presence of prosthetic heart valve: Secondary | ICD-10-CM

## 2024-06-23 DIAGNOSIS — D62 Acute posthemorrhagic anemia: Secondary | ICD-10-CM | POA: Diagnosis not present

## 2024-06-23 DIAGNOSIS — I25118 Atherosclerotic heart disease of native coronary artery with other forms of angina pectoris: Secondary | ICD-10-CM | POA: Diagnosis present

## 2024-06-23 DIAGNOSIS — E1122 Type 2 diabetes mellitus with diabetic chronic kidney disease: Secondary | ICD-10-CM | POA: Diagnosis present

## 2024-06-23 DIAGNOSIS — E876 Hypokalemia: Secondary | ICD-10-CM | POA: Diagnosis not present

## 2024-06-23 DIAGNOSIS — Z602 Problems related to living alone: Secondary | ICD-10-CM | POA: Diagnosis present

## 2024-06-23 DIAGNOSIS — J449 Chronic obstructive pulmonary disease, unspecified: Secondary | ICD-10-CM | POA: Diagnosis present

## 2024-06-23 DIAGNOSIS — F1490 Cocaine use, unspecified, uncomplicated: Secondary | ICD-10-CM | POA: Diagnosis present

## 2024-06-23 DIAGNOSIS — N189 Chronic kidney disease, unspecified: Secondary | ICD-10-CM | POA: Diagnosis not present

## 2024-06-23 DIAGNOSIS — Z7984 Long term (current) use of oral hypoglycemic drugs: Secondary | ICD-10-CM

## 2024-06-23 DIAGNOSIS — Z794 Long term (current) use of insulin: Secondary | ICD-10-CM | POA: Diagnosis not present

## 2024-06-23 DIAGNOSIS — I352 Nonrheumatic aortic (valve) stenosis with insufficiency: Secondary | ICD-10-CM | POA: Diagnosis present

## 2024-06-23 DIAGNOSIS — I130 Hypertensive heart and chronic kidney disease with heart failure and stage 1 through stage 4 chronic kidney disease, or unspecified chronic kidney disease: Secondary | ICD-10-CM | POA: Diagnosis present

## 2024-06-23 DIAGNOSIS — Z87891 Personal history of nicotine dependence: Secondary | ICD-10-CM

## 2024-06-23 DIAGNOSIS — R011 Cardiac murmur, unspecified: Secondary | ICD-10-CM | POA: Diagnosis present

## 2024-06-23 DIAGNOSIS — I700 Atherosclerosis of aorta: Secondary | ICD-10-CM | POA: Diagnosis present

## 2024-06-23 DIAGNOSIS — T502X5A Adverse effect of carbonic-anhydrase inhibitors, benzothiadiazides and other diuretics, initial encounter: Secondary | ICD-10-CM | POA: Diagnosis not present

## 2024-06-23 DIAGNOSIS — Z9889 Other specified postprocedural states: Secondary | ICD-10-CM

## 2024-06-23 DIAGNOSIS — M1990 Unspecified osteoarthritis, unspecified site: Secondary | ICD-10-CM | POA: Diagnosis present

## 2024-06-23 DIAGNOSIS — I350 Nonrheumatic aortic (valve) stenosis: Secondary | ICD-10-CM

## 2024-06-23 DIAGNOSIS — Z951 Presence of aortocoronary bypass graft: Principal | ICD-10-CM

## 2024-06-23 DIAGNOSIS — E1165 Type 2 diabetes mellitus with hyperglycemia: Secondary | ICD-10-CM | POA: Diagnosis present

## 2024-06-23 DIAGNOSIS — G4730 Sleep apnea, unspecified: Secondary | ICD-10-CM | POA: Diagnosis present

## 2024-06-23 DIAGNOSIS — I4891 Unspecified atrial fibrillation: Secondary | ICD-10-CM | POA: Diagnosis not present

## 2024-06-23 DIAGNOSIS — Z01818 Encounter for other preprocedural examination: Secondary | ICD-10-CM

## 2024-06-23 DIAGNOSIS — Z79890 Hormone replacement therapy (postmenopausal): Secondary | ICD-10-CM | POA: Diagnosis not present

## 2024-06-23 DIAGNOSIS — I5022 Chronic systolic (congestive) heart failure: Secondary | ICD-10-CM | POA: Diagnosis present

## 2024-06-23 DIAGNOSIS — N181 Chronic kidney disease, stage 1: Secondary | ICD-10-CM | POA: Diagnosis present

## 2024-06-23 DIAGNOSIS — E1159 Type 2 diabetes mellitus with other circulatory complications: Secondary | ICD-10-CM | POA: Diagnosis not present

## 2024-06-23 DIAGNOSIS — Z79899 Other long term (current) drug therapy: Secondary | ICD-10-CM

## 2024-06-23 HISTORY — PX: INTRAOPERATIVE TRANSESOPHAGEAL ECHOCARDIOGRAM: SHX5062

## 2024-06-23 HISTORY — PX: AORTIC VALVE REPLACEMENT: SHX41

## 2024-06-23 HISTORY — PX: CORONARY ARTERY BYPASS GRAFT: SHX141

## 2024-06-23 LAB — POCT I-STAT 7, (LYTES, BLD GAS, ICA,H+H)
Acid-base deficit: 2 mmol/L (ref 0.0–2.0)
Acid-base deficit: 5 mmol/L — ABNORMAL HIGH (ref 0.0–2.0)
Acid-base deficit: 2 mmol/L (ref 0.0–2.0)
Acid-base deficit: 5 mmol/L — ABNORMAL HIGH (ref 0.0–2.0)
Acid-base deficit: 3 mmol/L — ABNORMAL HIGH (ref 0.0–2.0)
Acid-base deficit: 4 mmol/L — ABNORMAL HIGH (ref 0.0–2.0)
Bicarbonate: 23.8 mmol/L (ref 20.0–28.0)
Bicarbonate: 19.9 mmol/L — ABNORMAL LOW (ref 20.0–28.0)
Bicarbonate: 23.4 mmol/L (ref 20.0–28.0)
Bicarbonate: 21.5 mmol/L (ref 20.0–28.0)
Bicarbonate: 21.9 mmol/L (ref 20.0–28.0)
Bicarbonate: 21.3 mmol/L (ref 20.0–28.0)
Calcium, Ion: 1.28 mmol/L (ref 1.15–1.40)
Calcium, Ion: 1.05 mmol/L — ABNORMAL LOW (ref 1.15–1.40)
Calcium, Ion: 1.29 mmol/L (ref 1.15–1.40)
Calcium, Ion: 1.26 mmol/L (ref 1.15–1.40)
Calcium, Ion: 1.19 mmol/L (ref 1.15–1.40)
Calcium, Ion: 1.12 mmol/L — ABNORMAL LOW (ref 1.15–1.40)
HCT: 37 % — ABNORMAL LOW (ref 39.0–52.0)
HCT: 27 % — ABNORMAL LOW (ref 39.0–52.0)
HCT: 27 % — ABNORMAL LOW (ref 39.0–52.0)
HCT: 27 % — ABNORMAL LOW (ref 39.0–52.0)
HCT: 26 % — ABNORMAL LOW (ref 39.0–52.0)
HCT: 26 % — ABNORMAL LOW (ref 39.0–52.0)
Hemoglobin: 12.6 g/dL — ABNORMAL LOW (ref 13.0–17.0)
Hemoglobin: 9.2 g/dL — ABNORMAL LOW (ref 13.0–17.0)
Hemoglobin: 9.2 g/dL — ABNORMAL LOW (ref 13.0–17.0)
Hemoglobin: 9.2 g/dL — ABNORMAL LOW (ref 13.0–17.0)
Hemoglobin: 8.8 g/dL — ABNORMAL LOW (ref 13.0–17.0)
Hemoglobin: 8.8 g/dL — ABNORMAL LOW (ref 13.0–17.0)
O2 Saturation: 100 %
O2 Saturation: 100 %
O2 Saturation: 100 %
O2 Saturation: 98 %
O2 Saturation: 99 %
O2 Saturation: 99 %
Patient temperature: 35.9
Patient temperature: 36.5
Patient temperature: 36.8
Potassium: 3.8 mmol/L (ref 3.5–5.1)
Potassium: 3.6 mmol/L (ref 3.5–5.1)
Potassium: 3.4 mmol/L — ABNORMAL LOW (ref 3.5–5.1)
Potassium: 3.7 mmol/L (ref 3.5–5.1)
Potassium: 4.6 mmol/L (ref 3.5–5.1)
Potassium: 4.3 mmol/L (ref 3.5–5.1)
Sodium: 137 mmol/L (ref 135–145)
Sodium: 141 mmol/L (ref 135–145)
Sodium: 141 mmol/L (ref 135–145)
Sodium: 141 mmol/L (ref 135–145)
Sodium: 140 mmol/L (ref 135–145)
Sodium: 140 mmol/L (ref 135–145)
TCO2: 25 mmol/L (ref 22–32)
TCO2: 21 mmol/L — ABNORMAL LOW (ref 22–32)
TCO2: 25 mmol/L (ref 22–32)
TCO2: 23 mmol/L (ref 22–32)
TCO2: 23 mmol/L (ref 22–32)
TCO2: 22 mmol/L (ref 22–32)
pCO2 arterial: 44.6 mmHg (ref 32–48)
pCO2 arterial: 36.8 mmHg (ref 32–48)
pCO2 arterial: 43.6 mmHg (ref 32–48)
pCO2 arterial: 45.3 mmHg (ref 32–48)
pCO2 arterial: 39.1 mmHg (ref 32–48)
pCO2 arterial: 37.9 mmHg (ref 32–48)
pH, Arterial: 7.335 — ABNORMAL LOW (ref 7.35–7.45)
pH, Arterial: 7.341 — ABNORMAL LOW (ref 7.35–7.45)
pH, Arterial: 7.339 — ABNORMAL LOW (ref 7.35–7.45)
pH, Arterial: 7.278 — ABNORMAL LOW (ref 7.35–7.45)
pH, Arterial: 7.354 (ref 7.35–7.45)
pH, Arterial: 7.358 (ref 7.35–7.45)
pO2, Arterial: 550 mmHg — ABNORMAL HIGH (ref 83–108)
pO2, Arterial: 405 mmHg — ABNORMAL HIGH (ref 83–108)
pO2, Arterial: 540 mmHg — ABNORMAL HIGH (ref 83–108)
pO2, Arterial: 108 mmHg (ref 83–108)
pO2, Arterial: 155 mmHg — ABNORMAL HIGH (ref 83–108)
pO2, Arterial: 162 mmHg — ABNORMAL HIGH (ref 83–108)

## 2024-06-23 LAB — ECHO INTRAOPERATIVE TEE
AV Mean grad: 35 mmHg
Height: 71 in
Weight: 2848 [oz_av]

## 2024-06-23 LAB — POCT I-STAT EG7
Acid-base deficit: 5 mmol/L — ABNORMAL HIGH (ref 0.0–2.0)
Bicarbonate: 21.4 mmol/L (ref 20.0–28.0)
Calcium, Ion: 1.09 mmol/L — ABNORMAL LOW (ref 1.15–1.40)
HCT: 26 % — ABNORMAL LOW (ref 39.0–52.0)
Hemoglobin: 8.8 g/dL — ABNORMAL LOW (ref 13.0–17.0)
O2 Saturation: 75 %
Potassium: 3.3 mmol/L — ABNORMAL LOW (ref 3.5–5.1)
Sodium: 142 mmol/L (ref 135–145)
TCO2: 23 mmol/L (ref 22–32)
pCO2, Ven: 43.5 mmHg — ABNORMAL LOW (ref 44–60)
pH, Ven: 7.3 (ref 7.25–7.43)
pO2, Ven: 44 mmHg (ref 32–45)

## 2024-06-23 LAB — CBC
HCT: 27.8 % — ABNORMAL LOW (ref 39.0–52.0)
HCT: 30 % — ABNORMAL LOW (ref 39.0–52.0)
Hemoglobin: 9.2 g/dL — ABNORMAL LOW (ref 13.0–17.0)
Hemoglobin: 9.8 g/dL — ABNORMAL LOW (ref 13.0–17.0)
MCH: 29.2 pg (ref 26.0–34.0)
MCH: 28.7 pg (ref 26.0–34.0)
MCHC: 33.1 g/dL (ref 30.0–36.0)
MCHC: 32.7 g/dL (ref 30.0–36.0)
MCV: 88.3 fL (ref 80.0–100.0)
MCV: 87.7 fL (ref 80.0–100.0)
Platelets: 94 K/uL — ABNORMAL LOW (ref 150–400)
Platelets: 111 K/uL — ABNORMAL LOW (ref 150–400)
RBC: 3.15 MIL/uL — ABNORMAL LOW (ref 4.22–5.81)
RBC: 3.42 MIL/uL — ABNORMAL LOW (ref 4.22–5.81)
RDW: 12.5 % (ref 11.5–15.5)
RDW: 12.6 % (ref 11.5–15.5)
WBC: 9 K/uL (ref 4.0–10.5)
WBC: 8.2 K/uL (ref 4.0–10.5)
nRBC: 0 % (ref 0.0–0.2)
nRBC: 0 % (ref 0.0–0.2)

## 2024-06-23 LAB — POCT I-STAT, CHEM 8
BUN: 21 mg/dL (ref 8–23)
BUN: 18 mg/dL (ref 8–23)
BUN: 17 mg/dL (ref 8–23)
BUN: 14 mg/dL (ref 8–23)
BUN: 17 mg/dL (ref 8–23)
BUN: 19 mg/dL (ref 8–23)
Calcium, Ion: 1.3 mmol/L (ref 1.15–1.40)
Calcium, Ion: 1.28 mmol/L (ref 1.15–1.40)
Calcium, Ion: 1.1 mmol/L — ABNORMAL LOW (ref 1.15–1.40)
Calcium, Ion: 0.96 mmol/L — ABNORMAL LOW (ref 1.15–1.40)
Calcium, Ion: 1.17 mmol/L (ref 1.15–1.40)
Calcium, Ion: 1.2 mmol/L (ref 1.15–1.40)
Chloride: 101 mmol/L (ref 98–111)
Chloride: 107 mmol/L (ref 98–111)
Chloride: 105 mmol/L (ref 98–111)
Chloride: 103 mmol/L (ref 98–111)
Chloride: 105 mmol/L (ref 98–111)
Chloride: 105 mmol/L (ref 98–111)
Creatinine, Ser: 0.8 mg/dL (ref 0.61–1.24)
Creatinine, Ser: 0.8 mg/dL (ref 0.61–1.24)
Creatinine, Ser: 0.7 mg/dL (ref 0.61–1.24)
Creatinine, Ser: 0.6 mg/dL — ABNORMAL LOW (ref 0.61–1.24)
Creatinine, Ser: 0.8 mg/dL (ref 0.61–1.24)
Creatinine, Ser: 0.7 mg/dL (ref 0.61–1.24)
Glucose, Bld: 302 mg/dL — ABNORMAL HIGH (ref 70–99)
Glucose, Bld: 106 mg/dL — ABNORMAL HIGH (ref 70–99)
Glucose, Bld: 71 mg/dL (ref 70–99)
Glucose, Bld: 147 mg/dL — ABNORMAL HIGH (ref 70–99)
Glucose, Bld: 64 mg/dL — ABNORMAL LOW (ref 70–99)
Glucose, Bld: 156 mg/dL — ABNORMAL HIGH (ref 70–99)
HCT: 41 % (ref 39.0–52.0)
HCT: 34 % — ABNORMAL LOW (ref 39.0–52.0)
HCT: 26 % — ABNORMAL LOW (ref 39.0–52.0)
HCT: 24 % — ABNORMAL LOW (ref 39.0–52.0)
HCT: 27 % — ABNORMAL LOW (ref 39.0–52.0)
HCT: 28 % — ABNORMAL LOW (ref 39.0–52.0)
Hemoglobin: 13.9 g/dL (ref 13.0–17.0)
Hemoglobin: 11.6 g/dL — ABNORMAL LOW (ref 13.0–17.0)
Hemoglobin: 8.8 g/dL — ABNORMAL LOW (ref 13.0–17.0)
Hemoglobin: 8.2 g/dL — ABNORMAL LOW (ref 13.0–17.0)
Hemoglobin: 9.2 g/dL — ABNORMAL LOW (ref 13.0–17.0)
Hemoglobin: 9.5 g/dL — ABNORMAL LOW (ref 13.0–17.0)
Potassium: 3.9 mmol/L (ref 3.5–5.1)
Potassium: 3.3 mmol/L — ABNORMAL LOW (ref 3.5–5.1)
Potassium: 3.5 mmol/L (ref 3.5–5.1)
Potassium: 3.2 mmol/L — ABNORMAL LOW (ref 3.5–5.1)
Potassium: 3.5 mmol/L (ref 3.5–5.1)
Potassium: 3.9 mmol/L (ref 3.5–5.1)
Sodium: 138 mmol/L (ref 135–145)
Sodium: 140 mmol/L (ref 135–145)
Sodium: 140 mmol/L (ref 135–145)
Sodium: 138 mmol/L (ref 135–145)
Sodium: 140 mmol/L (ref 135–145)
Sodium: 141 mmol/L (ref 135–145)
TCO2: 25 mmol/L (ref 22–32)
TCO2: 24 mmol/L (ref 22–32)
TCO2: 26 mmol/L (ref 22–32)
TCO2: 22 mmol/L (ref 22–32)
TCO2: 25 mmol/L (ref 22–32)
TCO2: 23 mmol/L (ref 22–32)

## 2024-06-23 LAB — BASIC METABOLIC PANEL WITH GFR
Anion gap: 8 (ref 5–15)
BUN: 13 mg/dL (ref 8–23)
CO2: 21 mmol/L — ABNORMAL LOW (ref 22–32)
Calcium: 7.9 mg/dL — ABNORMAL LOW (ref 8.9–10.3)
Chloride: 110 mmol/L (ref 98–111)
Creatinine, Ser: 0.79 mg/dL (ref 0.61–1.24)
GFR, Estimated: >60 mL/min (ref >60)
Glucose, Bld: 148 mg/dL — ABNORMAL HIGH (ref 70–99)
Potassium: 4.3 mmol/L (ref 3.5–5.1)
Sodium: 139 mmol/L (ref 135–145)

## 2024-06-23 LAB — PROTIME-INR
INR: 1.4 — ABNORMAL HIGH (ref 0.8–1.2)
Prothrombin Time: 17.9 s — ABNORMAL HIGH (ref 11.4–15.2)

## 2024-06-23 LAB — HEMOGLOBIN AND HEMATOCRIT, BLOOD
HCT: 29.2 % — ABNORMAL LOW (ref 39.0–52.0)
Hemoglobin: 9.8 g/dL — ABNORMAL LOW (ref 13.0–17.0)

## 2024-06-23 LAB — APTT: aPTT: 37 s — ABNORMAL HIGH (ref 24–36)

## 2024-06-23 LAB — GLUCOSE, CAPILLARY: Glucose-Capillary: 349 mg/dL — ABNORMAL HIGH (ref 70–99)

## 2024-06-23 LAB — PLATELET COUNT: Platelets: 120 K/uL — ABNORMAL LOW (ref 150–400)

## 2024-06-23 SURGERY — CORONARY ARTERY BYPASS GRAFTING (CABG)
Anesthesia: General | Site: Chest

## 2024-06-23 MED ORDER — SODIUM CHLORIDE 0.9 % IV SOLN: 250.0000 mL | INTRAVENOUS | Status: AC

## 2024-06-23 MED ORDER — LACTATED RINGERS IV SOLN: INTRAVENOUS | Status: DC | PRN

## 2024-06-23 MED ORDER — FENTANYL CITRATE (PF) 250 MCG/5ML IJ SOLN
INTRAMUSCULAR | Status: AC
  Filled 2024-06-23: qty 5

## 2024-06-23 MED ORDER — SODIUM BICARBONATE 8.4 % IV SOLN
100.0000 meq | Freq: Once | INTRAVENOUS | Status: AC
  Administered 2024-06-23: 100 meq via INTRAVENOUS

## 2024-06-23 MED ORDER — 0.9 % SODIUM CHLORIDE (POUR BTL) OPTIME
TOPICAL | Status: DC | PRN
  Administered 2024-06-23: 5000 mL

## 2024-06-23 MED ORDER — ASPIRIN 325 MG PO TBEC
325.0000 mg | DELAYED_RELEASE_TABLET | Freq: Every day | ORAL | Status: DC
  Administered 2024-06-24 – 2024-06-28 (×5): 325 mg via ORAL
  Filled 2024-06-23 (×5): qty 1

## 2024-06-23 MED ORDER — PHENYLEPHRINE 80 MCG/ML (10ML) SYRINGE FOR IV PUSH (FOR BLOOD PRESSURE SUPPORT)
PREFILLED_SYRINGE | INTRAVENOUS | Status: DC | PRN
  Administered 2024-06-23: 80 ug via INTRAVENOUS
  Administered 2024-06-23 (×3): 40 ug via INTRAVENOUS
  Administered 2024-06-23: 80 ug via INTRAVENOUS
  Administered 2024-06-23: 160 ug via INTRAVENOUS
  Administered 2024-06-23: 80 ug via INTRAVENOUS

## 2024-06-23 MED ORDER — CEFAZOLIN SODIUM-DEXTROSE 2-4 GM/100ML-% IV SOLN
2.0000 g | Freq: Three times a day (TID) | INTRAVENOUS | Status: AC
  Administered 2024-06-23 – 2024-06-25 (×6): 2 g via INTRAVENOUS
  Filled 2024-06-23 (×6): qty 100

## 2024-06-23 MED ORDER — PHENYLEPHRINE HCL-NACL 20-0.9 MG/250ML-% IV SOLN: 0.0000 ug/min | INTRAVENOUS | Status: DC

## 2024-06-23 MED ORDER — INSULIN REGULAR(HUMAN) IN NACL 100-0.9 UT/100ML-% IV SOLN: INTRAVENOUS | Status: DC

## 2024-06-23 MED ORDER — MAGNESIUM SULFATE 4 GM/100ML IV SOLN
4.0000 g | Freq: Once | INTRAVENOUS | Status: AC
  Administered 2024-06-23: 4 g via INTRAVENOUS
  Filled 2024-06-23: qty 100

## 2024-06-23 MED ORDER — METOCLOPRAMIDE HCL 5 MG/ML IJ SOLN
10.0000 mg | Freq: Four times a day (QID) | INTRAMUSCULAR | Status: AC
  Administered 2024-06-23 – 2024-06-24 (×6): 10 mg via INTRAVENOUS
  Filled 2024-06-23 (×6): qty 2

## 2024-06-23 MED ORDER — PROTAMINE SULFATE 10 MG/ML IV SOLN
INTRAVENOUS | Status: DC | PRN
  Administered 2024-06-23: 320 mg via INTRAVENOUS

## 2024-06-23 MED ORDER — SIMVASTATIN 5 MG PO TABS
10.0000 mg | ORAL_TABLET | Freq: Every day | ORAL | Status: DC
  Administered 2024-06-23: 10 mg via ORAL
  Filled 2024-06-23 (×2): qty 2

## 2024-06-23 MED ORDER — CHLORHEXIDINE GLUCONATE 0.12 % MT SOLN
15.0000 mL | OROMUCOSAL | Status: AC
  Administered 2024-06-23: 15 mL via OROMUCOSAL
  Filled 2024-06-23: qty 15

## 2024-06-23 MED ORDER — METOPROLOL TARTRATE 12.5 MG HALF TABLET
12.5000 mg | ORAL_TABLET | Freq: Once | ORAL | Status: AC
  Administered 2024-06-23: 12.5 mg via ORAL
  Filled 2024-06-23: qty 1

## 2024-06-23 MED ORDER — ~~LOC~~ CARDIAC SURGERY, PATIENT & FAMILY EDUCATION
Freq: Once | Status: DC
  Filled 2024-06-23: qty 1

## 2024-06-23 MED ORDER — INSULIN ASPART 100 UNIT/ML IJ SOLN
0.0000 [IU] | INTRAMUSCULAR | Status: DC | PRN
  Administered 2024-06-23: 6 [IU] via SUBCUTANEOUS

## 2024-06-23 MED ORDER — DEXTROSE 50 % IV SOLN
INTRAVENOUS | Status: DC | PRN
Start: 2024-06-23 — End: 2024-06-23
  Administered 2024-06-23: 20 mL via INTRAVENOUS

## 2024-06-23 MED ORDER — ALBUMIN HUMAN 5 % IV SOLN: INTRAVENOUS | Status: DC | PRN

## 2024-06-23 MED ORDER — PROTAMINE SULFATE 10 MG/ML IV SOLN
INTRAVENOUS | Status: AC
  Filled 2024-06-23: qty 5

## 2024-06-23 MED ORDER — POLYETHYLENE GLYCOL 3350 17 G PO PACK: 17.0000 g | PACK | Freq: Two times a day (BID) | ORAL | Status: DC | PRN

## 2024-06-23 MED ORDER — METOPROLOL TARTRATE 25 MG/10 ML ORAL SUSPENSION: 12.5000 mg | Freq: Two times a day (BID) | ORAL | Status: DC

## 2024-06-23 MED ORDER — METOPROLOL TARTRATE 12.5 MG HALF TABLET: 12.5000 mg | ORAL_TABLET | Freq: Two times a day (BID) | ORAL | Status: DC

## 2024-06-23 MED ORDER — PLASMA-LYTE A IV SOLN
INTRAVENOUS | Status: DC | PRN
  Administered 2024-06-23: 500 mL via INTRAVASCULAR

## 2024-06-23 MED ORDER — PHENYLEPHRINE 80 MCG/ML (10ML) SYRINGE FOR IV PUSH (FOR BLOOD PRESSURE SUPPORT)
PREFILLED_SYRINGE | INTRAVENOUS | Status: AC
  Filled 2024-06-23: qty 10

## 2024-06-23 MED ORDER — MIDAZOLAM HCL 2 MG/2ML IJ SOLN
INTRAMUSCULAR | Status: AC
  Filled 2024-06-23: qty 2

## 2024-06-23 MED ORDER — ARTIFICIAL TEARS OPHTHALMIC OINT
TOPICAL_OINTMENT | OPHTHALMIC | Status: DC | PRN
  Administered 2024-06-23: 1 via OPHTHALMIC

## 2024-06-23 MED ORDER — ROCURONIUM BROMIDE 10 MG/ML (PF) SYRINGE
PREFILLED_SYRINGE | INTRAVENOUS | Status: AC
  Filled 2024-06-23: qty 10

## 2024-06-23 MED ORDER — FENTANYL CITRATE (PF) 250 MCG/5ML IJ SOLN
INTRAMUSCULAR | Status: DC | PRN
  Administered 2024-06-23 (×2): 50 ug via INTRAVENOUS
  Administered 2024-06-23: 450 ug via INTRAVENOUS
  Administered 2024-06-23: 100 ug via INTRAVENOUS
  Administered 2024-06-23: 50 ug via INTRAVENOUS
  Administered 2024-06-23: 100 ug via INTRAVENOUS
  Administered 2024-06-23 (×2): 50 ug via INTRAVENOUS
  Administered 2024-06-23 (×2): 100 ug via INTRAVENOUS
  Administered 2024-06-23 (×3): 50 ug via INTRAVENOUS

## 2024-06-23 MED ORDER — METOPROLOL TARTRATE 5 MG/5ML IV SOLN: 2.5000 mg | INTRAVENOUS | Status: DC | PRN

## 2024-06-23 MED ORDER — HEPARIN SODIUM (PORCINE) 1000 UNIT/ML IJ SOLN
INTRAMUSCULAR | Status: DC | PRN
  Administered 2024-06-23: 32000 [IU] via INTRAVENOUS

## 2024-06-23 MED ORDER — ACETAMINOPHEN 160 MG/5ML PO SOLN: 1000.0000 mg | Freq: Four times a day (QID) | ORAL | Status: DC

## 2024-06-23 MED ORDER — NOREPINEPHRINE 4 MG/250ML-% IV SOLN: 0.0000 ug/min | INTRAVENOUS | Status: DC

## 2024-06-23 MED ORDER — NITROGLYCERIN 0.2 MG/ML ON CALL CATH LAB
INTRAVENOUS | Status: DC | PRN
Start: 2024-06-23 — End: 2024-06-23
  Administered 2024-06-23 (×6): 50 ug via INTRAVENOUS

## 2024-06-23 MED ORDER — CHLORHEXIDINE GLUCONATE 0.12 % MT SOLN: 15.0000 mL | Freq: Once | OROMUCOSAL | Status: AC

## 2024-06-23 MED ORDER — MIDAZOLAM HCL 2 MG/2ML IJ SOLN
INTRAMUSCULAR | Status: DC | PRN
  Administered 2024-06-23 (×4): 1 mg via INTRAVENOUS

## 2024-06-23 MED ORDER — ONDANSETRON HCL 4 MG/2ML IJ SOLN
INTRAMUSCULAR | Status: DC | PRN
  Administered 2024-06-23: 4 mg via INTRAVENOUS

## 2024-06-23 MED ORDER — TAMSULOSIN HCL 0.4 MG PO CAPS
0.4000 mg | ORAL_CAPSULE | Freq: Every day | ORAL | Status: DC
  Administered 2024-06-23 – 2024-06-27 (×5): 0.4 mg via ORAL
  Filled 2024-06-23 (×5): qty 1

## 2024-06-23 MED ORDER — INSULIN ASPART 100 UNIT/ML IJ SOLN
INTRAMUSCULAR | Status: AC
  Filled 2024-06-23: qty 1

## 2024-06-23 MED ORDER — DEXTROSE 50 % IV SOLN
INTRAVENOUS | Status: AC
  Filled 2024-06-23: qty 50

## 2024-06-23 MED ORDER — CHLORHEXIDINE GLUCONATE CLOTH 2 % EX PADS
6.0000 | MEDICATED_PAD | Freq: Every day | CUTANEOUS | Status: DC
  Administered 2024-06-23 – 2024-06-28 (×6): 6 via TOPICAL

## 2024-06-23 MED ORDER — ONDANSETRON HCL 4 MG/2ML IJ SOLN
4.0000 mg | Freq: Four times a day (QID) | INTRAMUSCULAR | Status: DC | PRN
  Filled 2024-06-23 (×2): qty 2

## 2024-06-23 MED ORDER — TRAMADOL HCL 50 MG PO TABS
50.0000 mg | ORAL_TABLET | ORAL | Status: DC | PRN
  Administered 2024-06-24: 100 mg via ORAL
  Administered 2024-06-25 (×2): 50 mg via ORAL
  Filled 2024-06-23 (×2): qty 1
  Filled 2024-06-23: qty 2

## 2024-06-23 MED ORDER — FLUTICASONE PROPIONATE 50 MCG/ACT NA SUSP
2.0000 | Freq: Every day | NASAL | Status: DC
  Administered 2024-06-24 – 2024-06-27 (×4): 2 via NASAL
  Filled 2024-06-23: qty 16

## 2024-06-23 MED ORDER — PANTOPRAZOLE SODIUM 40 MG IV SOLR
40.0000 mg | Freq: Every day | INTRAVENOUS | Status: AC
  Administered 2024-06-23 – 2024-06-24 (×2): 40 mg via INTRAVENOUS
  Filled 2024-06-23 (×2): qty 10

## 2024-06-23 MED ORDER — THROMBIN (RECOMBINANT) 20000 UNITS EX SOLR
CUTANEOUS | Status: AC
  Filled 2024-06-23: qty 20000

## 2024-06-23 MED ORDER — DEXMEDETOMIDINE HCL IN NACL 400 MCG/100ML IV SOLN: 0.0000 ug/kg/h | INTRAVENOUS | Status: DC

## 2024-06-23 MED ORDER — MIDAZOLAM HCL 2 MG/2ML IJ SOLN: 2.0000 mg | INTRAMUSCULAR | Status: DC | PRN

## 2024-06-23 MED ORDER — SODIUM CHLORIDE 0.9% FLUSH
3.0000 mL | Freq: Two times a day (BID) | INTRAVENOUS | Status: DC
  Administered 2024-06-24 – 2024-06-26 (×6): 3 mL via INTRAVENOUS

## 2024-06-23 MED ORDER — ASPIRIN 81 MG PO CHEW
324.0000 mg | CHEWABLE_TABLET | Freq: Every day | ORAL | Status: DC
  Filled 2024-06-23 (×2): qty 4

## 2024-06-23 MED ORDER — SODIUM CHLORIDE 0.9 % IV SOLN: INTRAVENOUS | Status: DC | PRN

## 2024-06-23 MED ORDER — VANCOMYCIN HCL IN DEXTROSE 1-5 GM/200ML-% IV SOLN
1000.0000 mg | Freq: Once | INTRAVENOUS | Status: AC
  Administered 2024-06-23: 1000 mg via INTRAVENOUS
  Filled 2024-06-23: qty 200

## 2024-06-23 MED ORDER — HEMOSTATIC AGENTS (NO CHARGE) OPTIME
TOPICAL | Status: DC | PRN
  Administered 2024-06-23: 1 via TOPICAL

## 2024-06-23 MED ORDER — PROTAMINE SULFATE 10 MG/ML IV SOLN
INTRAVENOUS | Status: AC
  Filled 2024-06-23: qty 25

## 2024-06-23 MED ORDER — PROPOFOL 10 MG/ML IV BOLUS
INTRAVENOUS | Status: DC | PRN
  Administered 2024-06-23: 20 mg via INTRAVENOUS

## 2024-06-23 MED ORDER — PANTOPRAZOLE SODIUM 40 MG PO TBEC
40.0000 mg | DELAYED_RELEASE_TABLET | Freq: Every day | ORAL | Status: DC
  Administered 2024-06-25 – 2024-06-28 (×4): 40 mg via ORAL
  Filled 2024-06-23 (×4): qty 1

## 2024-06-23 MED ORDER — EPINEPHRINE HCL 5 MG/250ML IV SOLN IN NS: 0.0000 ug/min | INTRAVENOUS | Status: DC

## 2024-06-23 MED ORDER — AMIODARONE HCL IN DEXTROSE 360-4.14 MG/200ML-% IV SOLN
INTRAVENOUS | Status: DC | PRN
  Administered 2024-06-23: 30 mg/h via INTRAVENOUS

## 2024-06-23 MED ORDER — ONDANSETRON HCL 4 MG/2ML IJ SOLN
INTRAMUSCULAR | Status: AC
Start: 2024-06-23 — End: 2024-06-23
  Filled 2024-06-23: qty 2

## 2024-06-23 MED ORDER — ROCURONIUM BROMIDE 10 MG/ML (PF) SYRINGE
PREFILLED_SYRINGE | INTRAVENOUS | Status: DC | PRN
  Administered 2024-06-23: 50 mg via INTRAVENOUS
  Administered 2024-06-23: 30 mg via INTRAVENOUS
  Administered 2024-06-23: 50 mg via INTRAVENOUS
  Administered 2024-06-23: 70 mg via INTRAVENOUS
  Administered 2024-06-23 (×2): 50 mg via INTRAVENOUS

## 2024-06-23 MED ORDER — BISACODYL 5 MG PO TBEC
10.0000 mg | DELAYED_RELEASE_TABLET | Freq: Every day | ORAL | Status: DC
  Administered 2024-06-24 – 2024-06-26 (×3): 10 mg via ORAL
  Filled 2024-06-23 (×5): qty 2

## 2024-06-23 MED ORDER — THROMBIN 20000 UNITS EX SOLR: CUTANEOUS | Status: DC | PRN

## 2024-06-23 MED ORDER — ETOMIDATE 2 MG/ML IV SOLN
INTRAVENOUS | Status: AC
  Filled 2024-06-23: qty 10

## 2024-06-23 MED ORDER — ACETAMINOPHEN 160 MG/5ML PO SOLN
650.0000 mg | Freq: Once | ORAL | Status: AC
  Administered 2024-06-23: 650 mg
  Filled 2024-06-23: qty 20.3

## 2024-06-23 MED ORDER — THROMBIN 5000 UNITS EX SOLR
CUTANEOUS | Status: DC | PRN
  Administered 2024-06-23 (×3): 5 mL via TOPICAL

## 2024-06-23 MED ORDER — NITROGLYCERIN IN D5W 200-5 MCG/ML-% IV SOLN
0.0000 ug/min | INTRAVENOUS | Status: DC
  Administered 2024-06-23: 5 ug/min via INTRAVENOUS

## 2024-06-23 MED ORDER — ETOMIDATE 2 MG/ML IV SOLN
INTRAVENOUS | Status: DC | PRN
  Administered 2024-06-23: 10 mg via INTRAVENOUS

## 2024-06-23 MED ORDER — LACTATED RINGERS IV SOLN: INTRAVENOUS | Status: AC

## 2024-06-23 MED ORDER — SODIUM CHLORIDE 0.9% FLUSH: 3.0000 mL | INTRAVENOUS | Status: DC | PRN

## 2024-06-23 MED ORDER — LIDOCAINE 2% (20 MG/ML) 5 ML SYRINGE
INTRAMUSCULAR | Status: DC | PRN
  Administered 2024-06-23: 40 mg via INTRAVENOUS

## 2024-06-23 MED ORDER — BISACODYL 10 MG RE SUPP: 10.0000 mg | Freq: Every day | RECTAL | Status: DC

## 2024-06-23 MED ORDER — PROPOFOL 10 MG/ML IV BOLUS
INTRAVENOUS | Status: AC
  Filled 2024-06-23: qty 20

## 2024-06-23 MED ORDER — POTASSIUM CHLORIDE 10 MEQ/50ML IV SOLN
10.0000 meq | INTRAVENOUS | Status: AC
  Administered 2024-06-23 (×3): 10 meq via INTRAVENOUS

## 2024-06-23 MED ORDER — VASOPRESSIN 20 UNIT/ML IV SOLN
INTRAVENOUS | Status: AC
Start: 2024-06-23 — End: 2024-06-23
  Filled 2024-06-23: qty 1

## 2024-06-23 MED ORDER — AMIODARONE HCL IN DEXTROSE 360-4.14 MG/200ML-% IV SOLN
30.0000 mg/h | INTRAVENOUS | Status: DC
  Administered 2024-06-24: 30 mg/h via INTRAVENOUS
  Filled 2024-06-23: qty 200

## 2024-06-23 MED ORDER — SODIUM CHLORIDE 0.45 % IV SOLN: INTRAVENOUS | Status: AC | PRN

## 2024-06-23 MED ORDER — POLYETHYLENE GLYCOL 3350 17 GM/SCOOP PO POWD: 17.0000 g | Freq: Two times a day (BID) | ORAL | Status: DC | PRN

## 2024-06-23 MED ORDER — LIDOCAINE 2% (20 MG/ML) 5 ML SYRINGE
INTRAMUSCULAR | Status: AC
Start: 2024-06-23 — End: 2024-06-23
  Filled 2024-06-23: qty 5

## 2024-06-23 MED ORDER — ASPIRIN 81 MG PO CHEW
324.0000 mg | CHEWABLE_TABLET | Freq: Once | ORAL | Status: AC
  Administered 2024-06-23: 324 mg via ORAL
  Filled 2024-06-23: qty 4

## 2024-06-23 MED ORDER — LEVOTHYROXINE SODIUM 25 MCG PO TABS
137.0000 ug | ORAL_TABLET | Freq: Every day | ORAL | Status: DC
  Administered 2024-06-24 – 2024-06-28 (×5): 137 ug via ORAL
  Filled 2024-06-23 (×5): qty 1

## 2024-06-23 MED ORDER — CHLORHEXIDINE GLUCONATE 0.12 % MT SOLN
OROMUCOSAL | Status: AC
  Administered 2024-06-23: 15 mL via OROMUCOSAL
  Filled 2024-06-23: qty 15

## 2024-06-23 MED ORDER — DOCUSATE SODIUM 100 MG PO CAPS
200.0000 mg | ORAL_CAPSULE | Freq: Every day | ORAL | Status: DC
  Administered 2024-06-24 – 2024-06-26 (×3): 200 mg via ORAL
  Filled 2024-06-23 (×5): qty 2

## 2024-06-23 MED ORDER — DEXTROSE 50 % IV SOLN
0.0000 mL | INTRAVENOUS | Status: DC | PRN
  Administered 2024-06-23: 20 mL via INTRAVENOUS
  Filled 2024-06-23: qty 50

## 2024-06-23 MED ORDER — ACETAMINOPHEN 500 MG PO TABS
1000.0000 mg | ORAL_TABLET | Freq: Four times a day (QID) | ORAL | Status: DC
  Administered 2024-06-24 – 2024-06-28 (×16): 1000 mg via ORAL
  Filled 2024-06-23 (×17): qty 2

## 2024-06-23 MED ORDER — MORPHINE SULFATE (PF) 2 MG/ML IV SOLN
1.0000 mg | INTRAVENOUS | Status: DC | PRN
  Administered 2024-06-23 – 2024-06-25 (×5): 2 mg via INTRAVENOUS
  Filled 2024-06-23: qty 1
  Filled 2024-06-23: qty 2
  Filled 2024-06-23 (×2): qty 1

## 2024-06-23 MED ORDER — LACTATED RINGERS IV SOLN
INTRAVENOUS | Status: AC
Start: 2024-06-23 — End: 2024-06-24

## 2024-06-23 MED ORDER — SODIUM CHLORIDE 0.9 % IV SOLN
INTRAVENOUS | Status: AC
Start: 2024-06-23 — End: 2024-06-24

## 2024-06-23 MED ORDER — ALBUMIN HUMAN 5 % IV SOLN
250.0000 mL | INTRAVENOUS | Status: DC | PRN
  Administered 2024-06-23 (×2): 12.5 g via INTRAVENOUS

## 2024-06-23 MED ORDER — CHLORHEXIDINE GLUCONATE 4 % EX SOLN: 30.0000 mL | CUTANEOUS | Status: DC

## 2024-06-23 MED ORDER — MIDAZOLAM HCL (PF) 5 MG/ML IJ SOLN: INTRAMUSCULAR | Status: DC | PRN

## 2024-06-23 MED ORDER — OXYCODONE HCL 5 MG PO TABS
5.0000 mg | ORAL_TABLET | ORAL | Status: DC | PRN
  Administered 2024-06-24 – 2024-06-26 (×2): 5 mg via ORAL
  Administered 2024-06-27: 10 mg via ORAL
  Filled 2024-06-23 (×3): qty 1
  Filled 2024-06-23: qty 2

## 2024-06-23 MED ORDER — HEPARIN SODIUM (PORCINE) 1000 UNIT/ML IJ SOLN
INTRAMUSCULAR | Status: AC
  Filled 2024-06-23: qty 1

## 2024-06-23 SURGICAL SUPPLY — 96 items
ADAPTER CARDIO PERF ANTE/RETRO (ADAPTER) ×2 IMPLANT
BAG DECANTER FOR FLEXI CONT (MISCELLANEOUS) ×2 IMPLANT
BLADE CLIPPER SURG (BLADE) ×2 IMPLANT
BLADE STERNUM SYSTEM 6 (BLADE) ×2 IMPLANT
BLADE SURG 11 STRL SS (BLADE) IMPLANT
BLADE SURG 15 STRL LF DISP TIS (BLADE) ×2 IMPLANT
BNDG ELASTIC 4INX 5YD STR LF (GAUZE/BANDAGES/DRESSINGS) IMPLANT
BNDG ELASTIC 4X5.8 VLCR STR LF (GAUZE/BANDAGES/DRESSINGS) ×2 IMPLANT
BNDG ELASTIC 6INX 5YD STR LF (GAUZE/BANDAGES/DRESSINGS) ×2 IMPLANT
BNDG GAUZE DERMACEA FLUFF 4 (GAUZE/BANDAGES/DRESSINGS) ×2 IMPLANT
CANISTER SUCTION 3000ML PPV (SUCTIONS) ×2 IMPLANT
CANNULA AORTIC ROOT 9FR (CANNULA) ×2 IMPLANT
CANNULA ARTERIAL NVNT 3/8 20FR (MISCELLANEOUS) IMPLANT
CANNULA GUNDRY RCSP 15FR (MISCELLANEOUS) ×2 IMPLANT
CANNULA MC2 2 STG 36/46 CONN (CANNULA) IMPLANT
CATH HEART VENT LEFT (CATHETERS) ×2 IMPLANT
CATH ROBINSON RED A/P 18FR (CATHETERS) ×6 IMPLANT
CATH THORACIC 28FR (CATHETERS) ×2 IMPLANT
CATH THORACIC 36FR (CATHETERS) ×2 IMPLANT
CATH THORACIC 36FR RT ANG (CATHETERS) ×2 IMPLANT
CLIP TI WIDE RED SMALL 24 (CLIP) IMPLANT
CNTNR URN SCR LID CUP LEK RST (MISCELLANEOUS) ×2 IMPLANT
CONTAINER PROTECT SURGISLUSH (MISCELLANEOUS) ×4 IMPLANT
COUNTER NDL 20CT MAGNET RED (NEEDLE) IMPLANT
COVER CAMERA OR STL DISP (MISCELLANEOUS) IMPLANT
DERMABOND ADVANCED .7 DNX12 (GAUZE/BANDAGES/DRESSINGS) IMPLANT
DEVICE SUT CK QUICK LOAD INDV (Prosthesis & Implant Heart) IMPLANT
DEVICE SUT CK QUICK LOAD MINI (Prosthesis & Implant Heart) IMPLANT
DRAPE SRG 135X102X78XABS (DRAPES) ×2 IMPLANT
DRAPE WARM FLUID 44X44 (DRAPES) ×2 IMPLANT
DRSG COVADERM 4X14 (GAUZE/BANDAGES/DRESSINGS) ×2 IMPLANT
ELECT CAUTERY BLADE 6.4 (BLADE) ×2 IMPLANT
ELECTRODE REM PT RTRN 9FT ADLT (ELECTROSURGICAL) ×4 IMPLANT
FELT TEFLON 1X6 (MISCELLANEOUS) ×4 IMPLANT
GAUZE SPONGE 4X4 12PLY STRL (GAUZE/BANDAGES/DRESSINGS) ×4 IMPLANT
GLOVE ECLIPSE 7.0 STRL STRAW (GLOVE) ×4 IMPLANT
GOWN STRL REUS W/ TWL LRG LVL3 (GOWN DISPOSABLE) ×8 IMPLANT
GOWN STRL REUS W/ TWL XL LVL3 (GOWN DISPOSABLE) ×2 IMPLANT
HEMOSTAT POWDER SURGIFOAM 1G (HEMOSTASIS) ×6 IMPLANT
HEMOSTAT SURGICEL 2X14 (HEMOSTASIS) ×2 IMPLANT
IV ADAPTER SYR DOUBLE MALE LL (MISCELLANEOUS) IMPLANT
KIT BASIN OR (CUSTOM PROCEDURE TRAY) ×2 IMPLANT
KIT SUCTION CATH 14FR (SUCTIONS) ×2 IMPLANT
KIT SUT CK MINI COMBO 4X17 (Prosthesis & Implant Heart) IMPLANT
KIT TURNOVER KIT B (KITS) ×2 IMPLANT
KIT VASOVIEW HEMOPRO 2 VH 4000 (KITS) ×2 IMPLANT
KNIFE MICRO-UNI 3.5 30 DEG (BLADE) ×2 IMPLANT
LINE VENT (MISCELLANEOUS) IMPLANT
NDL 27GX1/2 REG BEVEL ECLIP (NEEDLE) IMPLANT
NEEDLE 27GX1/2 REG BEVEL ECLIP (NEEDLE) ×2 IMPLANT
NS IRRIG 1000ML POUR BTL (IV SOLUTION) ×12 IMPLANT
PACK E OPEN HEART (SUTURE) ×2 IMPLANT
PACK OPEN HEART (CUSTOM PROCEDURE TRAY) ×2 IMPLANT
PAD ARMBOARD POSITIONER FOAM (MISCELLANEOUS) ×4 IMPLANT
PAD ELECT DEFIB RADIOL ZOLL (MISCELLANEOUS) ×2 IMPLANT
PENCIL BUTTON HOLSTER BLD 10FT (ELECTRODE) ×2 IMPLANT
POSITIONER HEAD DONUT 9IN (MISCELLANEOUS) ×2 IMPLANT
PUNCH AORTIC ROTATE 4.5MM 8IN (MISCELLANEOUS) ×2 IMPLANT
SENSOR MYOCARDIAL TEMP (MISCELLANEOUS) IMPLANT
SET MPS 3-ND DEL (MISCELLANEOUS) IMPLANT
SOLUTION ANTFG W/FOAM PAD STRL (MISCELLANEOUS) IMPLANT
SPONGE T-LAP 18X18 ~~LOC~~+RFID (SPONGE) IMPLANT
SUPPORT HEART JANKE-BARRON (MISCELLANEOUS) ×2 IMPLANT
SUT BONE WAX W31G (SUTURE) ×2 IMPLANT
SUT EB EXC GRN/WHT 2-0 V-5 (SUTURE) ×4 IMPLANT
SUT ETHIBON EXCEL 2-0 V-5 (SUTURE) IMPLANT
SUT ETHIBOND 2 0 SH 36X2 (SUTURE) IMPLANT
SUT ETHIBOND V-5 VALVE (SUTURE) IMPLANT
SUT MNCRL AB 4-0 PS2 18 (SUTURE) IMPLANT
SUT PROLENE 3 0 SH1 36 (SUTURE) ×2 IMPLANT
SUT PROLENE 4-0 RB1 .5 CRCL 36 (SUTURE) ×6 IMPLANT
SUT PROLENE 5 0 C 1 36 (SUTURE) IMPLANT
SUT PROLENE 6 0 C 1 30 (SUTURE) IMPLANT
SUT PROLENE 7 0 BV 1 (SUTURE) IMPLANT
SUT PROLENE 7 0 BV1 MDA (SUTURE) ×2 IMPLANT
SUT SILK 2 0 SH CR/8 (SUTURE) IMPLANT
SUT STEEL 6MS V (SUTURE) IMPLANT
SUT STEEL SZ 6 DBL 3X14 BALL (SUTURE) IMPLANT
SUT VIC AB 1 CTX36XBRD ANBCTR (SUTURE) ×4 IMPLANT
SUT VIC AB 2-0 CT1 TAPERPNT 27 (SUTURE) IMPLANT
SUT VIC AB 2-0 CTX 36 (SUTURE) IMPLANT
SUT VIC AB 3-0 X1 27 (SUTURE) IMPLANT
SYR 5ML LL (SYRINGE) IMPLANT
SYSTEM SAHARA CHEST DRAIN ATS (WOUND CARE) ×2 IMPLANT
TAPE CLOTH SURG 4X10 WHT LF (GAUZE/BANDAGES/DRESSINGS) IMPLANT
TAPE PAPER 2X10 WHT MICROPORE (GAUZE/BANDAGES/DRESSINGS) IMPLANT
TOWEL GREEN STERILE (TOWEL DISPOSABLE) ×2 IMPLANT
TOWEL GREEN STERILE FF (TOWEL DISPOSABLE) ×2 IMPLANT
TRAY FOLEY SLVR 16FR TEMP STAT (SET/KITS/TRAYS/PACK) ×2 IMPLANT
TUBE CONNECTING 20X1/4 (TUBING) IMPLANT
TUBE SUCT INTRACARD DLP 20F (MISCELLANEOUS) ×2 IMPLANT
TUBE SUCTION CARDIAC 10FR (CANNULA) ×2 IMPLANT
TUBING LAP HI FLOW INSUFFLATIO (TUBING) ×2 IMPLANT
UNDERPAD 30X36 HEAVY ABSORB (UNDERPADS AND DIAPERS) ×2 IMPLANT
VALVE AORTIC SZ25 INSP/RESIL (Prosthesis & Implant Heart) IMPLANT
WATER STERILE IRR 1000ML POUR (IV SOLUTION) ×4 IMPLANT

## 2024-06-23 NOTE — Interval H&P Note (Signed)
 History and Physical Interval Note:  06/23/2024 6:42 AM  Gordon Robinson  has presented today for surgery, with the diagnosis of CAD AS.  The various methods of treatment have been discussed with the patient and family. After consideration of risks, benefits and other options for treatment, the patient has consented to  Procedure(s): CORONARY ARTERY BYPASS GRAFTING (CABG) (N/A) REPLACEMENT, AORTIC VALVE, OPEN (N/A) ECHOCARDIOGRAM, TRANSESOPHAGEAL, INTRAOPERATIVE (N/A) as a surgical intervention.  The patient's history has been reviewed, patient examined, no change in status, stable for surgery.  I have reviewed the patient's chart and labs.  Questions were answered to the patient's satisfaction.     Yahye Siebert K Mariela Rex

## 2024-06-23 NOTE — Transfer of Care (Signed)
 Immediate Anesthesia Transfer of Care Note  Patient: Gordon Robinson  Procedure(s) Performed: CORONARY ARTERY BYPASS GRAFTING (CABG) TIMES FOUR USING LEFT INTERNAL MAMMARY ARTERY AND ENDOSCOPICALLY HARVESTED RIGHT GREATER SAPHENOUS VEIN (Chest) REPLACEMENT, AORTIC VALVE, OPEN USING INSPIRIS RESILIA  AORTIC TISSUE VALVE (Chest) ECHOCARDIOGRAM, TRANSESOPHAGEAL, INTRAOPERATIVE  Patient Location: SICU  Anesthesia Type:General  Level of Consciousness: sedated and Patient remains intubated per anesthesia plan  Airway & Oxygen Therapy: Patient remains intubated per anesthesia plan and Patient placed on Ventilator (see vital sign flow sheet for setting)  Post-op Assessment: Report given to RN and Post -op Vital signs reviewed and stable  Post vital signs: Reviewed and stable  Last Vitals:  Vitals Value Taken Time  BP 111/65 06/23/24 15:27  Temp 35.9 C 06/23/24 15:34  Pulse 89 06/23/24 15:34  Resp 16 06/23/24 15:34  SpO2 100 % 06/23/24 15:34  Vitals shown include unfiled device data.  Last Pain:  Vitals:   06/23/24 0614  TempSrc:   PainSc: 0-No pain         Complications: No notable events documented.

## 2024-06-23 NOTE — Progress Notes (Signed)
 Patient ID: Gordon Robinson, male   DOB: 1951-09-27, 73 y.o.   MRN: [id number]  TCTS Evening Rounds:   Hemodynamically stable  CI = 2.2  Has started to wake up on vent.   Urine output good  CT output low  CBC    Component Value Date/Time   WBC 9.0 06/23/2024 1519   RBC 3.15 (L) 06/23/2024 1519   HGB 9.2 (L) 06/23/2024 1539   HGB 16.0 05/02/2024 0915   HCT 27.0 (L) 06/23/2024 1539   HCT 50.3 05/02/2024 0915   PLT 94 (L) 06/23/2024 1519   PLT 185 05/02/2024 0915   MCV 88.3 06/23/2024 1519   MCV 91 05/02/2024 0915   MCH 29.2 06/23/2024 1519   MCHC 33.1 06/23/2024 1519   RDW 12.5 06/23/2024 1519   RDW 12.2 05/02/2024 0915   LYMPHSABS 1.3 05/15/2023 1944   MONOABS 0.7 05/15/2023 1944   EOSABS 0.1 05/15/2023 1944   BASOSABS 0.1 05/15/2023 1944     BMET    Component Value Date/Time   NA 141 06/23/2024 1539   NA 137 05/02/2024 0915   K 3.7 06/23/2024 1539   CL 105 06/23/2024 1313   CO2 27 06/21/2024 1159   GLUCOSE 156 (H) 06/23/2024 1313   BUN 19 06/23/2024 1313   BUN 25 05/02/2024 0915   CREATININE 0.70 06/23/2024 1313   CALCIUM  9.6 06/21/2024 1159   EGFR 57 (L) 05/02/2024 0915   GFRNONAA >60 06/21/2024 1159     A/P:  Stable postop course. Continue current plans. Wean to extubate.

## 2024-06-23 NOTE — Op Note (Signed)
 CARDIOVASCULAR SURGERY OPERATIVE NOTE  06/23/2024  Surgeon:  Dorise LOIS Fellers, MD  First Assistant: Rocky Shad,  PA-C:   An experienced assistant was required given the complexity of this surgery and the standard of surgical care. The assistant was needed for endoscopic vein harvest, exposure, dissection, suctioning, retraction of delicate tissues and sutures, instrument exchange and for overall help during this procedure.   Preoperative Diagnosis:  Severe multi-vessel coronary artery disease, severe aortic stenosis and moderate to severe aortic insufficiency.   Postoperative Diagnosis:  Same   Procedure:  Median Sternotomy Extracorporeal circulation 3.   Coronary artery bypass grafting x 4  Left internal mammary artery graft to the LAD SVG to diagonal SVG to OM SVG to PDA 4.   Endoscopic vein harvest from the right leg 5.   Aortic valve replacement using a 25 mm Edwards INSPIRIS RESILIA pericardial valve.   Anesthesia:  General Endotracheal   Clinical History/Surgical Indication:  This 73 year old gentleman has stage D, severe, symptomatic aortic stenosis and moderate aortic insufficiency with NYHA class II symptoms of exertional fatigue and shortness of breath as well as exertional angina. His catheterization shows severe multivessel coronary disease. I agree that open surgical aortic valve replacement and coronary bypass graft surgery is the best treatment for relief of his symptoms and to prevent further myocardial loss and left ventricular dysfunction. I discussed the operative procedure with the patient and family including alternatives, benefits and risks; including but not limited to bleeding, blood transfusion, infection, stroke, myocardial infarction, graft failure, heart block requiring a permanent pacemaker, organ dysfunction, and death. Gordon Robinson understands and agrees to  proceed.   Preparation:  The patient was seen in the preoperative holding area and the correct patient, correct operation were confirmed with the patient after reviewing the medical record and catheterization. The consent was signed by me. Preoperative antibiotics were given. A pulmonary arterial line and radial arterial line were placed by the anesthesia team. The patient was taken back to the operating room and positioned supine on the operating room table. After being placed under general endotracheal anesthesia by the anesthesia team a foley catheter was placed. The neck, chest, abdomen, and both legs were prepped with betadine  soap and solution and draped in the usual sterile manner. A surgical time-out was taken and the correct patient and operative procedure were confirmed with the nursing and anesthesia staff.   Cardiopulmonary Bypass:  A median sternotomy was performed. The pericardium was opened in the midline. Right ventricular function appeared normal. The ascending aorta was of normal size and had no palpable plaque. There were no contraindications to aortic cannulation or cross-clamping. The patient was fully systemically heparinized and the ACT was maintained > 400 sec. The proximal aortic arch was cannulated with a 20 F aortic cannula for arterial inflow. Venous cannulation was performed via the right atrial appendage using a two-staged venous cannula. An antegrade cardioplegia/vent cannula was inserted into the mid-ascending aorta.  A left ventricular vent was placed via the right superior pulmonary vein. A retrograde cardioplegia cannula was placed via the right atrium into the coronary sinus. Aortic occlusion was performed with a single cross-clamp. Systemic cooling to 32 degrees Centigrade and topical cooling of the heart with iced saline were used. Cold retrograde KBC cardioplegia was used to induce diastolic arrest and was then given at about 60 minute intervals throughout the period  of arrest to maintain myocardial temperature at or below 10 degrees centigrade. A temperature probe was inserted into the interventricular  septum and an insulating pad was placed in the pericardium.   Left internal mammary artery harvest:  The left side of the sternum was retracted using the Rultract retractor. The left internal mammary artery was harvested as a pedicle graft. All side branches were clipped. It was a large-sized vessel of good quality with excellent blood flow. It was ligated distally and divided. It was sprayed with topical papaverine  solution to prevent vasospasm.   Endoscopic vein harvest: Performed by Rocky Shad, PA-C  The right greater saphenous vein was harvested endoscopically through a 2 cm incision medial to the right knee. It was harvested from the upper thigh to below the knee. It was a medium-sized vein of good quality. The side branches were all ligated with 4-0 silk ties.    Coronary arteries:  The coronary arteries were examined.  LAD:  large vessel with mild segmental distal disease. The diagonal was a large vessel that was heavily diseased proximally but the distal vessel was free of disease. LCX:  Large OM was intramyocardial through mid portion and localized distally after the bifurcation. RCA:  Diffusely diseased extending out to the mid PDA. Distal PDA was graftable.   Grafts: Assisted by Rocky Shad, PA-C  LIMA to the LAD: 3 mm. It was sewn end to side using 8-0 prolene continuous suture. SVG to D1:  2.0 mm. It was sewn end to side using 7-0 prolene continuous suture. SVG to OM:  1.75 mm. It was sewn end to side using 7-0 prolene continuous suture. SVG to PDA:  1.6 mm. It was sewn end to side using 7-0 prolene continuous suture.  The proximal vein graft anastomoses of the PDA and OM grafts were performed to the mid-ascending aorta using continuous 6-0 prolene suture. The proximal anastomosis of the diagonal vein graft was performed to the  proximal portion of the OM vein graft in end to side manner using continuous 7-0 Prolene suture since the diagonal vein was not long enough to reach the aorta. Graft markers were placed around the proximal anastomoses on the aorta.  Aortic Valve Replacement:   A transverse aortotomy was performed 2 cm above the take-off of the right coronary artery. 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 The ostia of the coronary arteries were in normal position and were not obstructed. The native valve leaflets were excised and the annulus was decalcified with rongeurs. Care was taken to remove all particulate debris. The left ventricle was directly inspected for debris and then irrigated with ice saline solution. The annulus was sized and a size 25 mm Edwards INSPIRIS RESILIA pericardial valve was chosen. The model number was [id number] and the serial number was [id number].   2-0 Ethibond pledgeted horizontal mattress sutures were placed around the annulus with the pledgets in a sub-annular position. The sutures were placed through the sewing ring and the valve lowered into place. The sutures were tied using CorKnots. The valve seated nicely and the coronary ostia were not obstructed. The prosthetic valve leaflets moved normally and there was no sub-valvular obstruction. The aortotomy was closed using 4-0 Prolene suture in 2 layers with felt strips to reinforce the closure.   Completion:  The patient was rewarmed to 37 degrees Centigrade. A dose of warm retrograde reanimation cardioplegia was given. Head was placed in Trendelenburg position and de-airing maneuvers performed. The clamp was removed from the LIMA pedicle and there was rapid warming of the  septum and return of ventricular fibrillation. The crossclamp was removed with a time of 156 minutes. There was spontaneous return of sinus rhythm. The distal and  proximal anastomoses were checked for hemostasis. The position of the grafts was satisfactory. Two temporary epicardial pacing wires were placed on the right atrium and two on the right ventricle. The patient was weaned from CPB without difficulty on no inotropes. CPB time was 186 minutes. Cardiac output was 5 LPM. TEE showed unchanged LVEF of 45%, normal prosthetic aortic valve function with a mean gradient of 3 mm Hg. No AI or paravalvular leak. Heparin  was fully reversed with protamine  and the aortic and venous cannulas removed. Hemostasis was achieved. Mediastinal and left pleural drainage tubes were placed. The sternum was closed with  #6 stainless steel wires. The fascia was closed with continuous # 1 vicryl suture. The subcutaneous tissue was closed with 2-0 vicryl continuous suture. The skin was closed with 3-0 vicryl subcuticular suture. All sponge, needle, and instrument counts were reported correct at the end of the case. Dry sterile dressings were placed over the incisions and around the chest tubes which were connected to pleurevac suction. The patient was then transported to the surgical intensive care unit in stable condition.

## 2024-06-23 NOTE — Progress Notes (Signed)
 Ordering outpatient echocardiogram to be completed at [facility], results to Dr. Lucas and Dr. Mallipeddi

## 2024-06-23 NOTE — Anesthesia Procedure Notes (Signed)
 Central Venous Catheter Insertion Performed by: Leonce Athens, MD, anesthesiologist Start/End8/21/2025 6:46 AM, 06/23/2024 7:00 AM Patient location: Pre-op. Preanesthetic checklist: patient identified, IV checked, risks and benefits discussed, surgical consent, monitors and equipment checked, pre-op evaluation, timeout performed and anesthesia consent Position: supine Lidocaine  1% used for infiltration and patient sedated Hand hygiene performed  and maximum sterile barriers used  Catheter size: 8.5 Fr PA cath was placed.Sheath introducer Swan type:thermodilution Procedure performed using ultrasound guided technique. Ultrasound Notes:anatomy identified, needle tip was noted to be adjacent to the nerve/plexus identified, no ultrasound evidence of intravascular and/or intraneural injection and image(s) printed for medical record Attempts: 1 Following insertion, line sutured, dressing applied and Biopatch. Post procedure assessment: blood return through all ports, free fluid flow and no air  Patient tolerated the procedure well with no immediate complications. Additional procedure comments: PA catheter:  Routine monitors. Timeout, sterile prep, drape, FBP R neck.  Supine position.  1% Lido local, finder and trocar RIJ 1st pass with US  guidance.  Cordis placed over J wire. PA catheter in easily.  Sterile dressing applied.  Patient tolerated well, VSS.  JAYSON Leonce, MD.

## 2024-06-23 NOTE — Anesthesia Procedure Notes (Signed)
 Procedure Name: Intubation Date/Time: 06/23/2024 8:09 AM  Performed by: Harrold Macintosh, CRNAPre-anesthesia Checklist: Patient identified, Emergency Drugs available, Suction available and Patient being monitored Patient Re-evaluated:Patient Re-evaluated prior to induction Oxygen Delivery Method: Circle system utilized Preoxygenation: Pre-oxygenation with 100% oxygen Induction Type: IV induction Ventilation: Oral airway inserted - appropriate to patient size and Mask ventilation without difficulty Laryngoscope Size: Miller and 2 Grade View: Grade I Tube type: Oral Tube size: 8.0 mm Number of attempts: 1 Airway Equipment and Method: Stylet Placement Confirmation: ETT inserted through vocal cords under direct vision, positive ETCO2 and breath sounds checked- equal and bilateral Secured at: 22 cm Tube secured with: Tape Dental Injury: Teeth and Oropharynx as per pre-operative assessment

## 2024-06-23 NOTE — Hospital Course (Addendum)
 History of Present Illness:  Gordon Robinson is a 73 yo male with known history of type 2 diabetes, hypertension, smoking and COPD, chronic kidney disease, chronic systolic heart failure, aortic stenosis and aortic insufficiency who was therefore to the structural heart valve clinic for TAVR evaluation.  The majority of his care has been through the [facility].  He showed me a lab report from the [facility] recently that showed his hemoglobin A1c was 14.5.  He was recently started on insulin  for that.  His most recent echocardiogram in our system was in March 2025 and showed a moderately reduced left ventricular ejection fraction 40 to 45% with a mean transvalvular gradient of 40 mmHg and a valve area of 0.78 cm with a dimensionless index of 0.24.  There is moderate aortic insufficiency.  He reports exertional fatigue and shortness of breath over the past year as well as episodes of exertional substernal chest pain which are occurring more frequently and more severe.  He denies orthopnea and peripheral edema.  He has had occasional episodes of dizziness.  Unfortunately, his catheterization revealed multivessel CAD and it was felt he would require coronary bypass grafting and aortic valve replacement.  He was referred to Dr. Lucas for evaluation at which time he admitted to  history of smoking and cocaine use but said that he has not used either in 3 or 4 months.  He has full upper dentures and partial lower dentures.  He was felt to be a candidate for coronary bypass grafting and aortic valve replacement.  The risks and benefits of the procedure were explained to the patient and she was agreeable to proceed.  Hospital Course:   [name] presented to [facility] on 06/23/2024.  He was taken to the operating room and underwent CABG x 4 utilizing LIMA to LAD, SVG to PDA, SVG to OM, SVG to DIAGONAL as T graft off OM , Aortic Valve Replacement with 25 mm Edwards Resilia Inspiris Bioprosthetic Valve, and Endoscopic  harvest greater saphenous vein from his right leg.  He tolerated the procedure without difficulty and was taken to the SICU in stable condition. He was extubated. A line, Swan, and chest tubes were removed on post op day one. EPW were removed on post op day two. He was transitioned from IV Amiodarone  (on for a fib in the OR) to oral Amiodarone . He was above his pre op weight and diuresed accordingly.  He remained in stable sinus rhythm.  He was transferred to 4E Progressive Care on postop day 2.  He quickly regained independence with mobility and transfers.  He had return of appropriate bowel function.  He had no difficulty voiding after removal of the Foley catheter.  He was diuresed for expected perioperative weight gain initially with IV Lasix  and later transition to oral Lasix .  He was weaned from the supplemental oxygen without difficulty.

## 2024-06-23 NOTE — Anesthesia Procedure Notes (Signed)
 Arterial Line Insertion Start/End8/21/2025 6:40 AM, 06/23/2024 6:45 AM Performed by: Harrold Macintosh, CRNA, CRNA  Patient location: Pre-op. Preanesthetic checklist: patient identified, IV checked, site marked, risks and benefits discussed, surgical consent, monitors and equipment checked, pre-op evaluation, timeout performed and anesthesia consent Lidocaine  1% used for infiltration Left, radial was placed Catheter size: 20 G Hand hygiene performed  and maximum sterile barriers used   Attempts: 1 Procedure performed without using ultrasound guided technique. Following insertion, dressing applied and Biopatch. Post procedure assessment: normal and unchanged  Patient tolerated the procedure well with no immediate complications.

## 2024-06-23 NOTE — Progress Notes (Signed)
 Echocardiogram Echocardiogram Transesophageal has been performed.  Gordon Robinson 06/23/2024, 5:49 PM

## 2024-06-23 NOTE — Progress Notes (Signed)
 Anesthesia made aware of CBG 349. Dr. Leonce at beside. Ordered to start DM protocol at this time. Novolog  6 units given.

## 2024-06-23 NOTE — Brief Op Note (Signed)
 06/23/2024  7:29 AM  PATIENT:  Gordon Robinson  73 y.o. male  PRE-OPERATIVE DIAGNOSIS:  Coronary Artery Disease, Aortic Stenosis  POST-OPERATIVE DIAGNOSIS:  * No post-op diagnosis entered *  PROCEDURE:  Procedure(s):  CORONARY ARTERY BYPASS GRAFTING x 4 -LIMA to LAD -SVG to PDA -SVG to OM -SVG to DIAGONAL, T Graft off OM conduit  AORTIC VALVE REPLACEMENT\ -25 mmEdwards Resilia Bioprosthetic Valve  ECHOCARDIOGRAM, TRANSESOPHAGEAL, INTRAOPERATIVE (N/A)  ENDOSCOPIC HARVEST GREATER SAPHENOUS VEIN -Right Leg Vein harvest time: 30 min Vein prep time: 20 min  SURGEON:  Surgeons and Role:    * Lucas Dorise POUR, MD - Primary  PHYSICIAN ASSISTANT: Rocky Shad PA-C  ASSISTANTS: Jerel Fees RNFA   ANESTHESIA:   general  EBL:  500 mL   BLOOD ADMINISTERED:  CC CELLSAVER  DRAINS: Left Pleural Chest Tube, Mediastinal Chest Drains   LOCAL MEDICATIONS USED:  NONE  SPECIMEN:  Source of Specimen:  Aortic Valve Leaflets  DISPOSITION OF SPECIMEN:  PATHOLOGY  COUNTS:  YES  TOURNIQUET:  * No tourniquets in log *  DICTATION: .Dragon Dictation  PLAN OF CARE: Admit to inpatient   PATIENT DISPOSITION:  ICU - intubated and hemodynamically stable.   Delay start of Pharmacological VTE agent (>24hrs) due to surgical blood loss or risk of bleeding: yes

## 2024-06-24 ENCOUNTER — Inpatient Hospital Stay (HOSPITAL_COMMUNITY)

## 2024-06-24 ENCOUNTER — Encounter (HOSPITAL_COMMUNITY): Payer: Self-pay | Admitting: Surgery

## 2024-06-24 DIAGNOSIS — Z951 Presence of aortocoronary bypass graft: Secondary | ICD-10-CM

## 2024-06-24 DIAGNOSIS — Z952 Presence of prosthetic heart valve: Secondary | ICD-10-CM

## 2024-06-24 LAB — BASIC METABOLIC PANEL WITH GFR
Anion gap: 11 (ref 5–15)
Anion gap: 8 (ref 5–15)
BUN: 10 mg/dL (ref 8–23)
BUN: 11 mg/dL (ref 8–23)
CO2: 22 mmol/L (ref 22–32)
CO2: 23 mmol/L (ref 22–32)
Calcium: 8.5 mg/dL — ABNORMAL LOW (ref 8.9–10.3)
Calcium: 8.3 mg/dL — ABNORMAL LOW (ref 8.9–10.3)
Chloride: 113 mmol/L — ABNORMAL HIGH (ref 98–111)
Chloride: 106 mmol/L (ref 98–111)
Creatinine, Ser: 0.81 mg/dL (ref 0.61–1.24)
Creatinine, Ser: 0.98 mg/dL (ref 0.61–1.24)
GFR, Estimated: >60 mL/min (ref >60)
GFR, Estimated: >60 mL/min (ref >60)
Glucose, Bld: 131 mg/dL — ABNORMAL HIGH (ref 70–99)
Glucose, Bld: 109 mg/dL — ABNORMAL HIGH (ref 70–99)
Potassium: 4.6 mmol/L (ref 3.5–5.1)
Potassium: 4 mmol/L (ref 3.5–5.1)
Sodium: 137 mmol/L (ref 135–145)
Sodium: 137 mmol/L (ref 135–145)

## 2024-06-24 LAB — GLUCOSE, CAPILLARY
Glucose-Capillary: 103 mg/dL — ABNORMAL HIGH (ref 70–99)
Glucose-Capillary: 105 mg/dL — ABNORMAL HIGH (ref 70–99)
Glucose-Capillary: 109 mg/dL — ABNORMAL HIGH (ref 70–99)
Glucose-Capillary: 116 mg/dL — ABNORMAL HIGH (ref 70–99)
Glucose-Capillary: 119 mg/dL — ABNORMAL HIGH (ref 70–99)
Glucose-Capillary: 119 mg/dL — ABNORMAL HIGH (ref 70–99)
Glucose-Capillary: 123 mg/dL — ABNORMAL HIGH (ref 70–99)
Glucose-Capillary: 132 mg/dL — ABNORMAL HIGH (ref 70–99)
Glucose-Capillary: 133 mg/dL — ABNORMAL HIGH (ref 70–99)
Glucose-Capillary: 136 mg/dL — ABNORMAL HIGH (ref 70–99)
Glucose-Capillary: 139 mg/dL — ABNORMAL HIGH (ref 70–99)
Glucose-Capillary: 158 mg/dL — ABNORMAL HIGH (ref 70–99)
Glucose-Capillary: 163 mg/dL — ABNORMAL HIGH (ref 70–99)
Glucose-Capillary: 164 mg/dL — ABNORMAL HIGH (ref 70–99)
Glucose-Capillary: 176 mg/dL — ABNORMAL HIGH (ref 70–99)
Glucose-Capillary: 184 mg/dL — ABNORMAL HIGH (ref 70–99)
Glucose-Capillary: 187 mg/dL — ABNORMAL HIGH (ref 70–99)
Glucose-Capillary: 188 mg/dL — ABNORMAL HIGH (ref 70–99)
Glucose-Capillary: 60 mg/dL — ABNORMAL LOW (ref 70–99)
Glucose-Capillary: 72 mg/dL (ref 70–99)
Glucose-Capillary: 99 mg/dL (ref 70–99)

## 2024-06-24 LAB — CBC
HCT: 29.7 % — ABNORMAL LOW (ref 39.0–52.0)
HCT: 32.2 % — ABNORMAL LOW (ref 39.0–52.0)
Hemoglobin: 10 g/dL — ABNORMAL LOW (ref 13.0–17.0)
Hemoglobin: 10.6 g/dL — ABNORMAL LOW (ref 13.0–17.0)
MCH: 29.2 pg (ref 26.0–34.0)
MCH: 29.1 pg (ref 26.0–34.0)
MCHC: 33.7 g/dL (ref 30.0–36.0)
MCHC: 32.9 g/dL (ref 30.0–36.0)
MCV: 86.8 fL (ref 80.0–100.0)
MCV: 88.5 fL (ref 80.0–100.0)
Platelets: 112 K/uL — ABNORMAL LOW (ref 150–400)
Platelets: 110 K/uL — ABNORMAL LOW (ref 150–400)
RBC: 3.42 MIL/uL — ABNORMAL LOW (ref 4.22–5.81)
RBC: 3.64 MIL/uL — ABNORMAL LOW (ref 4.22–5.81)
RDW: 12.7 % (ref 11.5–15.5)
RDW: 13.1 % (ref 11.5–15.5)
WBC: 7.3 K/uL (ref 4.0–10.5)
WBC: 8.6 K/uL (ref 4.0–10.5)
nRBC: 0 % (ref 0.0–0.2)
nRBC: 0 % (ref 0.0–0.2)

## 2024-06-24 LAB — MAGNESIUM
Magnesium: 2.2 mg/dL (ref 1.7–2.4)
Magnesium: 2 mg/dL (ref 1.7–2.4)

## 2024-06-24 LAB — SURGICAL PATHOLOGY

## 2024-06-24 MED ORDER — POTASSIUM CHLORIDE CRYS ER 20 MEQ PO TBCR
20.0000 meq | EXTENDED_RELEASE_TABLET | Freq: Two times a day (BID) | ORAL | Status: AC
Start: 2024-06-24 — End: 2024-06-24
  Administered 2024-06-24 (×2): 20 meq via ORAL
  Filled 2024-06-24 (×2): qty 1

## 2024-06-24 MED ORDER — ROSUVASTATIN CALCIUM 20 MG PO TABS
20.0000 mg | ORAL_TABLET | Freq: Every day | ORAL | Status: DC
  Administered 2024-06-24 – 2024-06-28 (×5): 20 mg via ORAL
  Filled 2024-06-24 (×5): qty 1

## 2024-06-24 MED ORDER — INSULIN GLARGINE 100 UNIT/ML ~~LOC~~ SOLN
16.0000 [IU] | Freq: Every day | SUBCUTANEOUS | Status: DC
  Administered 2024-06-24: 16 [IU] via SUBCUTANEOUS
  Filled 2024-06-24 (×2): qty 0.16

## 2024-06-24 MED ORDER — ENOXAPARIN SODIUM 40 MG/0.4ML IJ SOSY
40.0000 mg | PREFILLED_SYRINGE | Freq: Every day | INTRAMUSCULAR | Status: DC
  Administered 2024-06-24 – 2024-06-27 (×4): 40 mg via SUBCUTANEOUS
  Filled 2024-06-24 (×4): qty 0.4

## 2024-06-24 MED ORDER — FENTANYL CITRATE PF 50 MCG/ML IJ SOSY
25.0000 ug | PREFILLED_SYRINGE | Freq: Once | INTRAMUSCULAR | Status: AC
  Administered 2024-06-24: 25 ug via INTRAVENOUS
  Filled 2024-06-24: qty 1

## 2024-06-24 MED ORDER — INSULIN ASPART 100 UNIT/ML IJ SOLN
0.0000 [IU] | INTRAMUSCULAR | Status: DC
  Administered 2024-06-24: 2 [IU] via SUBCUTANEOUS
  Administered 2024-06-24 (×2): 4 [IU] via SUBCUTANEOUS
  Administered 2024-06-25: 2 [IU] via SUBCUTANEOUS

## 2024-06-24 MED ORDER — AMIODARONE HCL 200 MG PO TABS
200.0000 mg | ORAL_TABLET | Freq: Two times a day (BID) | ORAL | Status: DC
  Administered 2024-06-24 – 2024-06-28 (×9): 200 mg via ORAL
  Filled 2024-06-24 (×9): qty 1

## 2024-06-24 MED ORDER — PHENOL 1.4 % MT LIQD
1.0000 | OROMUCOSAL | Status: DC | PRN
  Filled 2024-06-24: qty 177

## 2024-06-24 MED ORDER — FUROSEMIDE 10 MG/ML IJ SOLN
40.0000 mg | Freq: Two times a day (BID) | INTRAMUSCULAR | Status: AC
  Administered 2024-06-24 (×2): 40 mg via INTRAVENOUS
  Filled 2024-06-24 (×2): qty 4

## 2024-06-24 MED FILL — Thrombin (Recombinant) For Soln 20000 Unit: CUTANEOUS | Qty: 1 | Status: AC

## 2024-06-24 NOTE — Inpatient Diabetes Management (Signed)
 Inpatient Diabetes Program Recommendations  AACE/ADA: New Consensus Statement on Inpatient Glycemic Control (2015)  Target Ranges:  Prepandial:   less than 140 mg/dL      Peak postprandial:   less than 180 mg/dL (1-2 hours)      Critically ill patients:  140 - 180 mg/dL   Lab Results  Component Value Date   GLUCAP 105 (H) 06/24/2024   HGBA1C 13.9 (H) 06/21/2024    Review of Glycemic Control  Diabetes history: DM2 Outpatient Diabetes medications: Lantus  16-18 units daily, Jardiance  25 mg daily, Metformin 500 mg daily Current orders for Inpatient glycemic control: Lantus  16 units daily, Novolog  0-24 units correction scale every 4 hours.  Inpatient Diabetes Program Recommendations:   Spoke with patient at the bedside. Patient states that he just recently started the insulin  about 1 month ago. He takes Lantus  18 units daily at home. He has a blood glucose meter and strips and has been checking his blood sugar X2 per day. His PCP is at the [facility] in [place] whom he saw about 1 month ago.  Discussed HgbA1C of 13.9% and what his average blood sugar has been over the last 2-3 months. He knows that it is high. Hopefully starting the Lantus  will help to improve it. He is determined to do better with his control and take care of himself.   Patient stated that he has lost his appetite and probably could do better with a soft diet. He is missing some teeth, but has dentures he can use. It still is hard for him to chew.   Encouraged him to keep up on his diabetes care and follow up with the PCP.   Marjorie Lunger RN BSN CDE Diabetes Coordinator Pager: [phone]  8am-5pm

## 2024-06-24 NOTE — Progress Notes (Signed)
 1 Day Post-Op Procedure(s) (LRB): CORONARY ARTERY BYPASS GRAFTING (CABG) TIMES FOUR USING LEFT INTERNAL MAMMARY ARTERY AND ENDOSCOPICALLY HARVESTED RIGHT GREATER SAPHENOUS VEIN (N/A) REPLACEMENT, AORTIC VALVE, OPEN USING INSPIRIS RESILIA  AORTIC TISSUE VALVE (N/A) ECHOCARDIOGRAM, TRANSESOPHAGEAL, INTRAOPERATIVE (N/A) Subjective: Some chest wall pain but feels ok overall  Objective: Vital signs in last 24 hours: Temp:  [95.9 F (35.5 C)-99.5 F (37.5 C)] 98.8 F (37.1 C) (08/22 0655) Pulse Rate:  [66-96] 80 (08/22 0655) Cardiac Rhythm: Normal sinus rhythm (08/22 0400) Resp:  [12-31] 21 (08/22 0655) BP: (82-122)/(65-67) 82/65 (08/21 1528) SpO2:  [85 %-100 %] 98 % (08/22 0655) Arterial Line BP: (86-293)/(27-272) 139/58 (08/22 0655) FiO2 (%):  [50 %] 50 % (08/21 1529) Weight:  [86.4 kg] 86.4 kg (08/22 0500)  Hemodynamic parameters for last 24 hours: PAP: (17-74)/(1-33) 33/17 CVP:  [14 mmHg-16 mmHg] 14 mmHg PCWP:  [14 mmHg] 14 mmHg CO:  [3.1 L/min-6.2 L/min] 3.7 L/min CI:  [1.5 L/min/m2-3.1 L/min/m2] 1.9 L/min/m2  Intake/Output from previous day: 08/21 0701 - 08/22 0700 In: 5791.9 [I.V.:3230.2; Blood:225; IV Piggyback:2336.7] Out: 2535 [Urine:1835; Blood:500; Chest Tube:200] Intake/Output this shift: No intake/output data recorded.  General appearance: alert and cooperative Neurologic: intact Heart: regular rate and rhythm Lungs: clear to auscultation bilaterally Extremities: edema mild Wound: dressings dry  Lab Results: Recent Labs    06/23/24 2051 06/24/24 0438  WBC 8.2 7.3  HGB 9.8* 10.0*  HCT 30.0* 29.7*  PLT 111* 112*   BMET:  Recent Labs    06/23/24 2051 06/24/24 0438  NA 139 137  K 4.3 4.6  CL 110 113*  CO2 21* 22  GLUCOSE 148* 131*  BUN 13 10  CREATININE 0.79 0.81  CALCIUM  7.9* 8.5*    PT/INR:  Recent Labs    06/23/24 1519  LABPROT 17.9*  INR 1.4*   ABG    Component Value Date/Time   PHART 7.358 06/23/2024 1938   HCO3 21.3  06/23/2024 1938   TCO2 22 06/23/2024 1938   ACIDBASEDEF 4.0 (H) 06/23/2024 1938   O2SAT 99 06/23/2024 1938   CBG (last 3)  Recent Labs    06/24/24 0126 06/24/24 0230 06/24/24 0433  GLUCAP 119* 116* 136*   CXR: mild interstitial edema  ECG: NSR, no acute changes  Assessment/Plan: S/P Procedure(s) (LRB): CORONARY ARTERY BYPASS GRAFTING (CABG) TIMES FOUR USING LEFT INTERNAL MAMMARY ARTERY AND ENDOSCOPICALLY HARVESTED RIGHT GREATER SAPHENOUS VEIN (N/A) REPLACEMENT, AORTIC VALVE, OPEN USING INSPIRIS RESILIA  AORTIC TISSUE VALVE (N/A) ECHOCARDIOGRAM, TRANSESOPHAGEAL, INTRAOPERATIVE (N/A)  POD 1 Hemodynamically stable in sinus rhythm on IV amio for atrial fib in OR. Will transition to po amio. Hold off on lopressor  today and may resume tomorrow if BP and heart rate ok. EF 45%.  Start diuresis and KCL.  DC chest tubes, swan, arterial line.  Will keep pacing wires in today s/p AVR.  Poorly controlled DM with preop Hgb A1c of 13.9. Started on insulin  preop. Will transition to SEMGLEE  and SSI per Endo tool.  IS, OOB, mobilize.   LOS: 1 day    Gordon Robinson 06/24/2024

## 2024-06-24 NOTE — Anesthesia Postprocedure Evaluation (Signed)
 Anesthesia Post Note  Patient: Gordon Robinson  Procedure(s) Performed: CORONARY ARTERY BYPASS GRAFTING (CABG) TIMES FOUR USING LEFT INTERNAL MAMMARY ARTERY AND ENDOSCOPICALLY HARVESTED RIGHT GREATER SAPHENOUS VEIN (Chest) REPLACEMENT, AORTIC VALVE, OPEN USING INSPIRIS RESILIA  AORTIC TISSUE VALVE (Chest) ECHOCARDIOGRAM, TRANSESOPHAGEAL, INTRAOPERATIVE     Patient location during evaluation: SICU Anesthesia Type: General Level of consciousness: awake and alert, oriented and patient cooperative Pain management: pain level controlled Vital Signs Assessment: post-procedure vital signs reviewed and stable Respiratory status: spontaneous breathing, nonlabored ventilation, respiratory function stable and patient connected to nasal cannula oxygen Cardiovascular status: blood pressure returned to baseline and stable Postop Assessment: no apparent nausea or vomiting and adequate PO intake (sitting in chair, enjoying time with family) Anesthetic complications: no   No notable events documented.  Last Vitals:  Vitals:   06/24/24 1655 06/24/24 1700  BP:  120/78  Pulse: 89 91  Resp: (!) 24 16  Temp: 37.5 C 37.5 C  SpO2: 97% 96%    Last Pain:  Vitals:   06/24/24 1512  TempSrc:   PainSc: 3                  Eraina Winnie,E. Patrica Mendell

## 2024-06-24 NOTE — Plan of Care (Signed)
  Problem: Education: Goal: Knowledge of General Education information will improve Description: Including pain rating scale, medication(s)/side effects and non-pharmacologic comfort measures Outcome: Progressing   Problem: Health Behavior/Discharge Planning: Goal: Ability to manage health-related needs will improve Outcome: Progressing   Problem: Clinical Measurements: Goal: Ability to maintain clinical measurements within normal limits will improve Outcome: Progressing Goal: Will remain free from infection Outcome: Progressing Goal: Diagnostic test results will improve Outcome: Progressing   Problem: Activity: Goal: Risk for activity intolerance will decrease Outcome: Progressing   Problem: Pain Managment: Goal: General experience of comfort will improve and/or be controlled Outcome: Progressing   Problem: Safety: Goal: Ability to remain free from injury will improve Outcome: Progressing   Problem: Skin Integrity: Goal: Risk for impaired skin integrity will decrease Outcome: Progressing

## 2024-06-25 ENCOUNTER — Encounter (HOSPITAL_COMMUNITY): Payer: Self-pay | Admitting: Surgery

## 2024-06-25 ENCOUNTER — Inpatient Hospital Stay (HOSPITAL_COMMUNITY)

## 2024-06-25 LAB — BASIC METABOLIC PANEL WITH GFR
Anion gap: 4 — ABNORMAL LOW (ref 5–15)
BUN: 13 mg/dL (ref 8–23)
CO2: 22 mmol/L (ref 22–32)
Calcium: 7.7 mg/dL — ABNORMAL LOW (ref 8.9–10.3)
Chloride: 108 mmol/L (ref 98–111)
Creatinine, Ser: 0.99 mg/dL (ref 0.61–1.24)
GFR, Estimated: >60 mL/min (ref >60)
Glucose, Bld: 164 mg/dL — ABNORMAL HIGH (ref 70–99)
Potassium: 4 mmol/L (ref 3.5–5.1)
Sodium: 134 mmol/L — ABNORMAL LOW (ref 135–145)

## 2024-06-25 LAB — CBC
HCT: 29.6 % — ABNORMAL LOW (ref 39.0–52.0)
Hemoglobin: 9.8 g/dL — ABNORMAL LOW (ref 13.0–17.0)
MCH: 29.4 pg (ref 26.0–34.0)
MCHC: 33.1 g/dL (ref 30.0–36.0)
MCV: 88.9 fL (ref 80.0–100.0)
Platelets: 91 K/uL — ABNORMAL LOW (ref 150–400)
RBC: 3.33 MIL/uL — ABNORMAL LOW (ref 4.22–5.81)
RDW: 13 % (ref 11.5–15.5)
WBC: 9.7 K/uL (ref 4.0–10.5)
nRBC: 0 % (ref 0.0–0.2)

## 2024-06-25 LAB — GLUCOSE, CAPILLARY
Glucose-Capillary: 155 mg/dL — ABNORMAL HIGH (ref 70–99)
Glucose-Capillary: 183 mg/dL — ABNORMAL HIGH (ref 70–99)
Glucose-Capillary: 194 mg/dL — ABNORMAL HIGH (ref 70–99)
Glucose-Capillary: 189 mg/dL — ABNORMAL HIGH (ref 70–99)
Glucose-Capillary: 196 mg/dL — ABNORMAL HIGH (ref 70–99)
Glucose-Capillary: 163 mg/dL — ABNORMAL HIGH (ref 70–99)

## 2024-06-25 MED ORDER — POTASSIUM CHLORIDE CRYS ER 20 MEQ PO TBCR
20.0000 meq | EXTENDED_RELEASE_TABLET | Freq: Two times a day (BID) | ORAL | Status: AC
Start: 2024-06-25 — End: 2024-06-25
  Administered 2024-06-25 (×2): 20 meq via ORAL
  Filled 2024-06-25 (×2): qty 1

## 2024-06-25 MED ORDER — SODIUM CHLORIDE 0.9% FLUSH: 3.0000 mL | INTRAVENOUS | Status: DC | PRN

## 2024-06-25 MED ORDER — FUROSEMIDE 10 MG/ML IJ SOLN
40.0000 mg | Freq: Two times a day (BID) | INTRAMUSCULAR | Status: AC
  Administered 2024-06-25 (×2): 40 mg via INTRAVENOUS
  Filled 2024-06-25 (×2): qty 4

## 2024-06-25 MED ORDER — FENTANYL CITRATE PF 50 MCG/ML IJ SOSY: 25.0000 ug | PREFILLED_SYRINGE | Freq: Once | INTRAMUSCULAR | Status: DC

## 2024-06-25 MED ORDER — INSULIN ASPART 100 UNIT/ML IJ SOLN: 0.0000 [IU] | Freq: Three times a day (TID) | INTRAMUSCULAR | Status: DC

## 2024-06-25 MED ORDER — INSULIN GLARGINE 100 UNIT/ML ~~LOC~~ SOLN
25.0000 [IU] | Freq: Every day | SUBCUTANEOUS | Status: DC
  Administered 2024-06-25 – 2024-06-28 (×4): 25 [IU] via SUBCUTANEOUS
  Filled 2024-06-25 (×4): qty 0.25

## 2024-06-25 MED ORDER — SODIUM CHLORIDE 0.9 % IV SOLN
250.0000 mL | INTRAVENOUS | Status: AC | PRN
Start: 2024-06-25 — End: 2024-06-26

## 2024-06-25 MED ORDER — SODIUM CHLORIDE 0.9% FLUSH
3.0000 mL | Freq: Two times a day (BID) | INTRAVENOUS | Status: DC
  Administered 2024-06-26 – 2024-06-28 (×5): 3 mL via INTRAVENOUS

## 2024-06-25 MED ORDER — INSULIN ASPART 100 UNIT/ML IJ SOLN
0.0000 [IU] | Freq: Three times a day (TID) | INTRAMUSCULAR | Status: DC
  Administered 2024-06-25 (×2): 4 [IU] via SUBCUTANEOUS
  Administered 2024-06-26: 8 [IU] via SUBCUTANEOUS
  Administered 2024-06-26: 2 [IU] via SUBCUTANEOUS
  Administered 2024-06-26: 4 [IU] via SUBCUTANEOUS
  Administered 2024-06-27 (×2): 2 [IU] via SUBCUTANEOUS
  Administered 2024-06-27: 4 [IU] via SUBCUTANEOUS

## 2024-06-25 MED ORDER — INSULIN ASPART 100 UNIT/ML IJ SOLN
0.0000 [IU] | Freq: Three times a day (TID) | INTRAMUSCULAR | Status: DC
  Administered 2024-06-25 (×2): 4 [IU] via SUBCUTANEOUS

## 2024-06-25 MED ORDER — ~~LOC~~ CARDIAC SURGERY, PATIENT & FAMILY EDUCATION: Freq: Once | Status: AC

## 2024-06-25 NOTE — Progress Notes (Signed)
      [address]       [place] [address]             [phone]                 2 Days Post-Op Procedure(s) (LRB): CORONARY ARTERY BYPASS GRAFTING (CABG) TIMES FOUR USING LEFT INTERNAL MAMMARY ARTERY AND ENDOSCOPICALLY HARVESTED RIGHT GREATER SAPHENOUS VEIN (N/A) REPLACEMENT, AORTIC VALVE, OPEN USING INSPIRIS RESILIA  AORTIC TISSUE VALVE (N/A) ECHOCARDIOGRAM, TRANSESOPHAGEAL, INTRAOPERATIVE (N/A)   Events: No events _______________________________________________________________ Vitals: BP 93/69   Pulse 98   Temp 98.4 F (36.9 C) (Oral)   Resp (!) 0   Ht 5' 11 (1.803 m)   Wt 82.4 kg   SpO2 97%   BMI 25.34 kg/m  Filed Weights   06/23/24 0549 06/24/24 0500 06/25/24 0500  Weight: 80.7 kg 86.4 kg 82.4 kg     - Neuro: alert NAD  - Cardiovascular: sinus  Drips: none.      - Pulm: EWOB    ABG    Component Value Date/Time   PHART 7.358 06/23/2024 1938   PCO2ART 37.9 06/23/2024 1938   PO2ART 162 (H) 06/23/2024 1938   HCO3 21.3 06/23/2024 1938   TCO2 22 06/23/2024 1938   ACIDBASEDEF 4.0 (H) 06/23/2024 1938   O2SAT 99 06/23/2024 1938    - Abd: ND - Extremity: warm  .Intake/Output      08/22 0701 08/23 0700 08/23 0701 08/24 0700   P.O. 240    I.V. (mL/kg) 302.6 (3.7)    Blood     IV Piggyback 215.9 100   Total Intake(mL/kg) 758.6 (9.2) 100 (1.2)   Urine (mL/kg/hr) 1690 (0.9)    Blood     Chest Tube 475    Total Output 2165    Net -1406.5 +100           _______________________________________________________________ Labs:    Latest Ref Rng & Units 06/25/2024    5:30 AM 06/24/2024    5:00 PM 06/24/2024    4:38 AM  CBC  WBC 4.0 - 10.5 K/uL 9.7  8.6  7.3   Hemoglobin 13.0 - 17.0 g/dL 9.8  89.3  89.9   Hematocrit 39.0 - 52.0 % 29.6  32.2  29.7   Platelets 150 - 400 K/uL 91  110  112       Latest Ref Rng & Units 06/25/2024    5:30 AM 06/24/2024    5:00 PM 06/24/2024    4:38 AM  CMP  Glucose 70 - 99 mg/dL 835  890  868    BUN 8 - 23 mg/dL 13  11  10    Creatinine 0.61 - 1.24 mg/dL 9.00  9.01  9.18   Sodium 135 - 145 mmol/L 134  137  137   Potassium 3.5 - 5.1 mmol/L 4.0  4.0  4.6   Chloride 98 - 111 mmol/L 108  106  113   CO2 22 - 32 mmol/L 22  23  22    Calcium  8.9 - 10.3 mg/dL 7.7  8.3  8.5     CXR: stable  _______________________________________________________________  Assessment and Plan: POD 2 s/p AVR/CABG  Neuro: pain controlled. CV: on A/S/BB.  Will remove pacing wires Pulm: IS ambulation Renal: creat stable.  diuresing GI: on diet Heme: stable ID: afebrile Endo: SSI Dispo: floor.   Gordon Robinson 06/25/2024 9:51 AM

## 2024-06-25 NOTE — Plan of Care (Signed)

## 2024-06-26 LAB — GLUCOSE, CAPILLARY
Glucose-Capillary: 99 mg/dL (ref 70–99)
Glucose-Capillary: 144 mg/dL — ABNORMAL HIGH (ref 70–99)
Glucose-Capillary: 175 mg/dL — ABNORMAL HIGH (ref 70–99)
Glucose-Capillary: 217 mg/dL — ABNORMAL HIGH (ref 70–99)

## 2024-06-26 LAB — CBC
HCT: 28.8 % — ABNORMAL LOW (ref 39.0–52.0)
Hemoglobin: 9.3 g/dL — ABNORMAL LOW (ref 13.0–17.0)
MCH: 29.2 pg (ref 26.0–34.0)
MCHC: 32.3 g/dL (ref 30.0–36.0)
MCV: 90.6 fL (ref 80.0–100.0)
Platelets: 99 K/uL — ABNORMAL LOW (ref 150–400)
RBC: 3.18 MIL/uL — ABNORMAL LOW (ref 4.22–5.81)
RDW: 12.8 % (ref 11.5–15.5)
WBC: 7.8 K/uL (ref 4.0–10.5)
nRBC: 0 % (ref 0.0–0.2)

## 2024-06-26 LAB — BASIC METABOLIC PANEL WITH GFR
Anion gap: 6 (ref 5–15)
BUN: 17 mg/dL (ref 8–23)
CO2: 25 mmol/L (ref 22–32)
Calcium: 8 mg/dL — ABNORMAL LOW (ref 8.9–10.3)
Chloride: 106 mmol/L (ref 98–111)
Creatinine, Ser: 1.13 mg/dL (ref 0.61–1.24)
GFR, Estimated: >60 mL/min (ref >60)
Glucose, Bld: 97 mg/dL (ref 70–99)
Potassium: 3.9 mmol/L (ref 3.5–5.1)
Sodium: 137 mmol/L (ref 135–145)

## 2024-06-26 NOTE — Plan of Care (Signed)

## 2024-06-26 NOTE — Progress Notes (Addendum)
 Gordon Robinson [id number]             [phone]      3 Days Post-Op Procedure(s) (LRB): CORONARY ARTERY BYPASS GRAFTING (CABG) TIMES FOUR USING LEFT INTERNAL MAMMARY ARTERY AND ENDOSCOPICALLY HARVESTED RIGHT GREATER SAPHENOUS VEIN (N/A) REPLACEMENT, AORTIC VALVE, OPEN USING INSPIRIS RESILIA  AORTIC TISSUE VALVE (N/A) ECHOCARDIOGRAM, TRANSESOPHAGEAL, INTRAOPERATIVE (N/A) Subjective: Up in the bedside chair eating breakfast.  Has already walked in the hall this morning.  Feels he is progressing, no new concerns.  BM x 2 yesterday   Objective: Vital signs in last 24 hours: Temp:  [97.7 F (36.5 C)-98.5 F (36.9 C)] 97.8 F (36.6 C) (08/24 0728) Pulse Rate:  [94-104] 100 (08/24 0728) Cardiac Rhythm: Sinus tachycardia (08/23 1900) Resp:  [15-25] 23 (08/24 0728) BP: (90-121)/(63-90) 95/66 (08/24 0728) SpO2:  [90 %-100 %] 96 % (08/24 0728) Weight:  [84.8 kg] 84.8 kg (08/24 0500)    Intake/Output from previous day: 08/23 0701 - 08/24 0700 In: 580 [P.O.:480; IV Piggyback:100] Out: 1450 [Urine:1450] Intake/Output this shift: No intake/output data recorded.  General appearance: alert, cooperative, and no distress Neurologic: intact Heart: RRR, no murmur. Stable NSR on monitor, no further atrial fibrillation.  Lungs: breath sounds CTA, normal work of breathing on RA.  Abdomen: soft, no tenderness Extremities: no peripheral edema. The RLE EVH incision is covered with a dry dressing.  Wound: the sternotomy incision is intact and dry  Lab Results: Recent Labs    06/25/24 0530 06/26/24 0350  WBC 9.7 7.8  HGB 9.8* 9.3*  HCT 29.6* 28.8*  PLT 91* 99*   BMET:  Recent Labs    06/25/24 0530 06/26/24 0350  NA 134* 137  K 4.0 3.9  CL 108 106  CO2 22 25  GLUCOSE 164* 97  BUN 13 17  CREATININE 0.99 1.13  CALCIUM  7.7* 8.0*    PT/INR:  Recent Labs    06/23/24 1519  LABPROT 17.9*  INR 1.4*   ABG    Component Value Date/Time   PHART 7.358 06/23/2024 1938    HCO3 21.3 06/23/2024 1938   TCO2 22 06/23/2024 1938   ACIDBASEDEF 4.0 (H) 06/23/2024 1938   O2SAT 99 06/23/2024 1938   CBG (last 3)  Recent Labs    06/25/24 1610 06/25/24 2111 06/26/24 0649  GLUCAP 196* 163* 99    Assessment/Plan: S/P Procedure(s) (LRB): CORONARY ARTERY BYPASS GRAFTING (CABG) TIMES FOUR USING LEFT INTERNAL MAMMARY ARTERY AND ENDOSCOPICALLY HARVESTED RIGHT GREATER SAPHENOUS VEIN (N/A) REPLACEMENT, AORTIC VALVE, OPEN USING INSPIRIS RESILIA  AORTIC TISSUE VALVE (N/A) ECHOCARDIOGRAM, TRANSESOPHAGEAL, INTRAOPERATIVE (N/A)  -POD3 CABG x4 and bioprosthetic AVR for CAD and severe AS, preserved ventricular function. Progressing well. On ASA, rosuvastatin  and PRN metoprolol . Will hold off on scheduled  metoprolol  since SBP in the 90's. Progressing well with mobility.  -Atrial fibrillation: occurred in OR and was loaded with IV amiodarone . Now on oral amio, no further atrial fibrillation.  -PULM- h/o COPD. Doing well with pulm hygiene, well oxygenated on RA.   -RENAL: normal function. Wt is ~8lbs+, Lasix  40mg  BID today.   -ENDO- type 2 DM and hypothyroidism.  CBG control acceptable on 25 units daily and SSI.  Thyroid replacement resumed. Plan to resume his Jardiance  at discharge.   -HEME- expected ABL anemia, Hct stable.   -DVT PPX- enoxaparin , continue ambulation.   -Disposition: nearly independent with mobility and transfers. Anticipate he will be ready for discharge in 1-2 days if progress continues  and rhythm stable.    LOS: 3 days    Myron G. Roddenberry, PA-C 06/26/2024  Agree Doing well Dispo planning  Zebulan Hinshaw O Rico Massar

## 2024-06-27 LAB — GLUCOSE, CAPILLARY
Glucose-Capillary: 87 mg/dL (ref 70–99)
Glucose-Capillary: 139 mg/dL — ABNORMAL HIGH (ref 70–99)
Glucose-Capillary: 188 mg/dL — ABNORMAL HIGH (ref 70–99)
Glucose-Capillary: 159 mg/dL — ABNORMAL HIGH (ref 70–99)

## 2024-06-27 LAB — BASIC METABOLIC PANEL WITH GFR
Anion gap: 6 (ref 5–15)
BUN: 14 mg/dL (ref 8–23)
CO2: 24 mmol/L (ref 22–32)
Calcium: 7.9 mg/dL — ABNORMAL LOW (ref 8.9–10.3)
Chloride: 106 mmol/L (ref 98–111)
Creatinine, Ser: 1 mg/dL (ref 0.61–1.24)
GFR, Estimated: >60 mL/min (ref >60)
Glucose, Bld: 86 mg/dL (ref 70–99)
Potassium: 3.3 mmol/L — ABNORMAL LOW (ref 3.5–5.1)
Sodium: 136 mmol/L (ref 135–145)

## 2024-06-27 MED ORDER — FUROSEMIDE 40 MG PO TABS
40.0000 mg | ORAL_TABLET | Freq: Every day | ORAL | Status: AC
  Administered 2024-06-27 – 2024-06-28 (×2): 40 mg via ORAL
  Filled 2024-06-27 (×2): qty 1

## 2024-06-27 MED ORDER — POTASSIUM CHLORIDE CRYS ER 20 MEQ PO TBCR
40.0000 meq | EXTENDED_RELEASE_TABLET | Freq: Two times a day (BID) | ORAL | Status: AC
  Administered 2024-06-27 (×2): 40 meq via ORAL
  Filled 2024-06-27 (×2): qty 2

## 2024-06-27 MED ORDER — POTASSIUM CHLORIDE CRYS ER 20 MEQ PO TBCR
20.0000 meq | EXTENDED_RELEASE_TABLET | Freq: Once | ORAL | Status: AC
  Administered 2024-06-28: 20 meq via ORAL
  Filled 2024-06-27 (×2): qty 1

## 2024-06-27 NOTE — Progress Notes (Signed)
 Pt sts he ambulated with MT recently. Discussed with pt and family IS, sternal precautions, diet, smoking cessation (quit since May), exercise, and CRPII. Pt receptive and motivated to stay quit from drugs and nicotine. Will refer to [facility] CRPII.  [phone] Aliene Aris BS, ACSM-CEP 06/27/2024 2:24 PM

## 2024-06-27 NOTE — Progress Notes (Signed)
 Mobility Specialist Progress Note:   06/27/24 1425  Mobility  Activity Ambulated with assistance  Level of Assistance Contact guard assist, steadying assist  Assistive Device Front wheel walker  Distance Ambulated (ft) 250 ft  Activity Response Tolerated well  Mobility Referral Yes  Mobility visit 1 Mobility  Mobility Specialist Start Time (ACUTE ONLY) 1425  Mobility Specialist Stop Time (ACUTE ONLY) 1440  Mobility Specialist Time Calculation (min) (ACUTE ONLY) 15 min   Pt agreeable to mobility session. Able to maintain sternal precautions with cues, states he did not follow them earlier today and felt a pull in his chest. No unsteadiness or LOB noted. Back in chair with all needs met.  Therisa Rana Mobility Specialist Please contact via SecureChat or  Rehab office at [phone]

## 2024-06-27 NOTE — Progress Notes (Signed)
 Pt up in recliner, sts he doesn't want to walk now. Set up appt to discuss ed with family at 1330 today. Encouraged ambulation again (walked at 0100) Aliene Aris BS, ACSM-CEP 06/27/2024 10:23 AM

## 2024-06-27 NOTE — Discharge Instructions (Signed)

## 2024-06-27 NOTE — Discharge Summary (Signed)
 [address] [place] [address]             [phone]     Physician Discharge Summary  Patient ID: Gordon Robinson MRN: [id number] DOB/AGE: 09-Apr-1951 73 y.o.  Admit date: 06/23/2024 Discharge date: 06/28/2024  Admission Diagnoses:  Patient Active Problem List   Diagnosis Date Noted   Chronic systolic heart failure (HCC) 12/25/2023   Cardiomyopathy (HCC) 12/25/2023   Severe aortic valve stenosis 12/25/2023   Moderate aortic regurgitation 12/25/2023   ERRONEOUS ENCOUNTER--DISREGARD 10/20/2023   Protein-calorie malnutrition, severe 05/22/2023   Acetabulum fracture, right (HCC) 05/15/2023   Abrasion of right forearm 05/15/2023   Type 2 diabetes mellitus (HCC) 05/15/2023   Acquired hypothyroidism 05/15/2023   Mixed hyperlipidemia 05/15/2023   Tobacco use disorder 05/15/2023   Cigarette smoker 07/29/2022   Solitary pulmonary nodule on lung CT 07/28/2022   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 07/28/2022   Rib fractures 05/24/2022   Discharge Diagnoses:  Patient Active Problem List   Diagnosis Date Noted   S/P CABG x 4 06/23/2024   S/P AVR 06/23/2024   Chronic systolic heart failure (HCC) 12/25/2023   Cardiomyopathy (HCC) 12/25/2023   Severe aortic valve stenosis 12/25/2023   Moderate aortic regurgitation 12/25/2023   ERRONEOUS ENCOUNTER--DISREGARD 10/20/2023   Protein-calorie malnutrition, severe 05/22/2023   Acetabulum fracture, right (HCC) 05/15/2023   Abrasion of right forearm 05/15/2023   Type 2 diabetes mellitus (HCC) 05/15/2023   Acquired hypothyroidism 05/15/2023   Mixed hyperlipidemia 05/15/2023   Tobacco use disorder 05/15/2023   Cigarette smoker 07/29/2022   Solitary pulmonary nodule on lung CT 07/28/2022   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  The majority of his care has been through the [facility] system.  He showed me a lab report from the [facility] recently that showed his hemoglobin A1c was 14.5.  He was recently started on insulin  for that.  His most recent echocardiogram in our system was in March 2025 and showed a moderately reduced left ventricular ejection fraction 40 to 45% with a mean transvalvular gradient of 40 mmHg and a valve area of 0.78 cm with a dimensionless index of 0.24.  There is moderate aortic insufficiency.  He reports exertional fatigue and shortness of breath over the past year as well as episodes of exertional substernal chest pain which are occurring more frequently and more severe.  He denies orthopnea and peripheral edema.  He has had occasional episodes of dizziness.  Unfortunately, his catheterization revealed multivessel CAD and it was felt he would require coronary bypass grafting and aortic valve replacement.  He was referred to Dr. Lucas for evaluation at which time he admitted to  history of smoking and cocaine use but said that he has not used either in 3 or 4 months.  He has full upper dentures and partial lower dentures.  He was felt to be a candidate for coronary bypass grafting and aortic valve replacement.  The risks and benefits of the procedure were explained to the patient and she was agreeable to proceed.  Hospital  Course:   Gordon Robinson presented to [facility] on 06/23/2024.  He was taken to the operating room and underwent CABG x 4 utilizing LIMA to LAD, SVG to PDA, SVG to OM, SVG to DIAGONAL as T graft off OM , Aortic Valve Replacement with 25 mm Edwards Resilia Inspiris Bioprosthetic Valve, and Endoscopic harvest greater saphenous vein from his right leg.  He tolerated the procedure without difficulty  and was taken to the SICU in stable condition. He was extubated. A line, Swan, and chest tubes were removed on post op day one. EPW were removed on post op day two. He was transitioned from IV Amiodarone  (on for a fib in the OR) to oral Amiodarone . He was above his pre op weight and diuresed accordingly.  He remained in stable sinus rhythm.  He was transferred to 4E Progressive [facility] on postop day 2.  He quickly regained independence with mobility and transfers.  He had return of appropriate bowel function.  He had no difficulty voiding after removal of the Foley catheter.  He was diuresed for expected perioperative weight gain initially with IV Lasix  and later transition to oral Lasix .  He was weaned from the supplemental oxygen without difficulty.  The patient developed mild hypokalemia due to diuresis.  He was supplemented as indicated.  He is ambulating without difficulty.  His surgical wounds are healing without difficulty.  The patient is stable for discharge home today.  Consults: None  Significant Diagnostic Studies: angiography:   1.  Known severe aortic stenosis by noninvasive assessment.  Aortic valve is calcified and restricted on plain fluoroscopy. 2.  Three-vessel coronary artery disease with nonobstructive stenosis of the dominant RCA, severe stenosis of the large first OM branch of the circumflex, and severe bifurcation stenosis of the LAD/first diagonal 3.  Right heart hemodynamics: RA a mean 3 mmHg RV 34/5 mmHg PA 28/12 mean 19 mmHg Cardiac output 4.7 L/min Cardiac index 2.35 L/min/m   Recommendations: Cardiac surgical consultation for consideration of aortic valve replacement/CABG  ECHO:  IMPRESSIONS     1. Left ventricular ejection fraction, by estimation, is 40 to 45%. Left  ventricular ejection fraction by 3D volume is 42 %. The left ventricle has  mildly decreased function. The left ventricle demonstrates global  hypokinesis. There is moderate left  ventricular  hypertrophy. Left ventricular diastolic parameters are  consistent with Grade I diastolic dysfunction (impaired relaxation).  Elevated left ventricular end-diastolic pressure. The average left  ventricular global longitudinal strain is -11.2 %.  The global longitudinal strain is abnormal.   2. Right ventricular systolic function is normal. The right ventricular  size is normal. There is normal pulmonary artery systolic pressure.   3. The mitral valve is normal in structure. Trivial mitral valve  regurgitation. No evidence of mitral stenosis.   4. The aortic valve is tricuspid. There is severe calcifcation of the  aortic valve. Aortic valve regurgitation is moderate. Severe aortic valve  stenosis. Aortic regurgitation PHT measures 381 msec. Aortic valve area,  by VTI measures 0.78 cm. Aortic  valve mean gradient measures 40.0 mmHg. Aortic valve Vmax measures 3.87  m/s. DVI is 0.24.   5. The inferior vena cava is normal in size with greater than 50%  respiratory variability, suggesting right atrial pressure of 3 mmHg.   Treatments: surgery:   06/23/2024   Surgeon:  Dorise LOIS Fellers, MD   First Assistant: Rocky Shad,  PA-C:   An experienced assistant was required given the complexity of this  surgery and the standard of surgical care. The assistant was needed for endoscopic vein harvest, exposure, dissection, suctioning, retraction of delicate tissues and sutures, instrument exchange and for overall help during this procedure.     Preoperative Diagnosis:  Severe multi-vessel coronary artery disease, severe aortic stenosis and moderate to severe aortic insufficiency.     Postoperative Diagnosis:  Same     Procedure:   Median Sternotomy Extracorporeal circulation 3.   Coronary artery bypass grafting x 4   Left internal mammary artery graft to the LAD SVG to diagonal SVG to OM SVG to PDA 4.   Endoscopic vein harvest from the right leg 5.   Aortic valve replacement using a 25 mm  Edwards INSPIRIS RESILIA pericardial valve.   Discharge Exam: Blood pressure 122/78, pulse 96, temperature 97.9 F (36.6 C), temperature source Oral, resp. rate 20, height 5' 11 (1.803 m), weight 177 lb 11.1 oz (80.6 kg), SpO2 97%.  General appearance: alert, cooperative, and no distress Heart: regular rate and rhythm Lungs: diminished breath sounds bibasilar Abdomen: soft, non-tender; bowel sounds normal; no masses,  no organomegaly Extremities: edema mild pitting Wound: clean and dry, ecchymosis across sternotomy  Discharge Medications:  The patient has been discharged on:   1.Beta Blocker:  Yes [   ]                              No   [ x  ]                              If No, reason: low BP  2.Ace Inhibitor/ARB: Yes [   ]                                     No  [ x   ]                                     If No, reason: low BP  3.Statin:   Yes [ x  ]                  No  [   ]                  If No, reason:  4.Ecasa:  Yes  [ x  ]                  No   [   ]                  If No, reason:  Patient had ACS upon admission: No  Plavix/P2Y12 inhibitor: Yes [  X ]                                      No  [   ]     Discharge Instructions     Amb Referral to Cardiac Rehabilitation   Complete by: As directed    Diagnosis:  CABG Valve Replacement     Valve: Aortic   CABG X ___: 4   After initial evaluation and assessments completed: Virtual Based Care may be  provided alone or in conjunction with Phase 2 Cardiac Rehab based on patient barriers.: Yes   Intensive Cardiac Rehabilitation (ICR) [facility] location only OR Traditional Cardiac Rehabilitation (TCR) *If criteria for ICR are not met will enroll in TCR ([facility] only): Yes      Allergies as of 06/28/2024   No Known Allergies      Medication List     STOP taking these medications    empagliflozin  25 MG Tabs tablet Commonly known as: JARDIANCE    lisinopril 5 MG tablet Commonly known as: ZESTRIL   simvastatin   10 MG tablet Commonly known as: ZOCOR        TAKE these medications    acetaminophen  500 MG tablet Commonly known as: TYLENOL  Take 1-2 tablets (500-1,000 mg total) by mouth every 6 (six) hours as needed.   amiodarone  200 MG tablet Commonly known as: PACERONE  Take 1 tablet (200 mg total) by mouth 2 (two) times daily for 7 days, then decrease to 200 mg daily   aspirin  EC 81 MG tablet Take 1 tablet (81 mg total) by mouth daily. Swallow whole.   diclofenac Sodium 1 % Gel Commonly known as: VOLTAREN Apply 2 g topically 3 (three) times daily as needed (pain).   fluticasone  50 MCG/ACT nasal spray Commonly known as: FLONASE  Place 2 sprays into both nostrils daily.   insulin  glargine 100 UNIT/ML injection Commonly known as: LANTUS  Inject 16-18 Units into the skin every morning.   levothyroxine  137 MCG tablet Commonly known as: SYNTHROID  Take 137 mcg by mouth daily before breakfast.   multivitamin tablet Take 1 tablet by mouth daily.   oxyCODONE  5 MG immediate release tablet Commonly known as: Oxy IR/ROXICODONE  Take 1 tablet (5 mg total) by mouth every 4 (four) hours as needed for severe pain (pain score 7-10).   polyethylene glycol powder 17 GM/SCOOP powder Commonly known as: MiraLax  Take 17 g by mouth 2 (two) times daily as needed for moderate constipation.   rosuvastatin  20 MG tablet Commonly known as: CRESTOR  Take 1 tablet (20 mg total) by mouth daily.   tamsulosin  0.4 MG Caps capsule Commonly known as: FLOMAX  Take 0.4 mg by mouth at bedtime as needed (Prostate). What changed: when to take this        Follow-up Information     [facility] [employer] [employer] Follow up on 07/05/2024.   Specialty: Cardiothoracic Surgery Why: Appointment is at 10:30, For suture removal Contact information: [address] [place]  [address] [phone]        Johnson Laymon HERO, PA-C Follow up on 07/08/2024.    Specialties: Cardiology, Cardiology Why: Appointment is at 2:00 Contact information: [address] [place] [address] [phone]         Rutha Manuelita HERO, PA-C Follow up on 07/20/2024.   Specialties: Physician Assistant, Thoracic Surgery Why: Appointment is at 1:00, please get CXR at 12:00 on second floor of our office building prior to your appointment Contact information: [address], [place] [address] [phone]         [facility], [facility]  Follow up.   Why: HH referral by [facility] office- they will follow up post discharge for services and scheduling Contact information: [address] [place] [address] [phone]         [facility], [facility] Follow up.   Why: (Adapt) hospital bed and elevated toilet arranged- they will check  coverage and call you regarding delivery (if not covered by your [facility] plan then will arrange with the [facility]) Contact information: [address] [facility] [place] [address] [phone]         [facility] Follow up.   Why: Contact your primary care if you need to arrange for aide services to assist with cooking/cleaning/etc. - they can see if you are eligible for services under your [facility] benefits.                SignedBETHA Rocky Shad, PA-C  06/29/2024, 9:24 AM

## 2024-06-27 NOTE — Care Management Important Message (Signed)
 Important Message  Patient Details  Name: Gordon Robinson MRN: [id number] Date of Birth: 15-Jun-1951   Important Message Given:  Yes - Medicare IM     Gordon Robinson 06/27/2024, 10:47 AM

## 2024-06-27 NOTE — Progress Notes (Signed)
 4 Days Post-Op Procedure(s) (LRB): CORONARY ARTERY BYPASS GRAFTING (CABG) TIMES FOUR USING LEFT INTERNAL MAMMARY ARTERY AND ENDOSCOPICALLY HARVESTED RIGHT GREATER SAPHENOUS VEIN (N/A) REPLACEMENT, AORTIC VALVE, OPEN USING INSPIRIS RESILIA  AORTIC TISSUE VALVE (N/A) ECHOCARDIOGRAM, TRANSESOPHAGEAL, INTRAOPERATIVE (N/A) Subjective: No complaints. Walking, eating and bowels working.  Objective: Vital signs in last 24 hours: Temp:  [97.8 F (36.6 C)-98.7 F (37.1 C)] 98.3 F (36.8 C) (08/25 0216) Pulse Rate:  [93-104] 93 (08/25 0216) Cardiac Rhythm: Sinus tachycardia (08/24 1900) Resp:  [10-23] 10 (08/25 0216) BP: (94-110)/(59-84) 97/59 (08/25 0216) SpO2:  [95 %-97 %] 95 % (08/25 0216) Weight:  [82.2 kg] 82.2 kg (08/25 0216)  Hemodynamic parameters for last 24 hours:    Intake/Output from previous day: No intake/output data recorded. Intake/Output this shift: No intake/output data recorded.  General appearance: alert and cooperative Neurologic: intact Heart: regular rate and rhythm Lungs: clear to auscultation bilaterally Extremities: no edema Wound: incisions healing well  Lab Results: Recent Labs    06/25/24 0530 06/26/24 0350  WBC 9.7 7.8  HGB 9.8* 9.3*  HCT 29.6* 28.8*  PLT 91* 99*   BMET:  Recent Labs    06/26/24 0350 06/27/24 0341  NA 137 136  K 3.9 3.3*  CL 106 106  CO2 25 24  GLUCOSE 97 86  BUN 17 14  CREATININE 1.13 1.00  CALCIUM  8.0* 7.9*    PT/INR: No results for input(s): LABPROT, INR in the last 72 hours. ABG    Component Value Date/Time   PHART 7.358 06/23/2024 1938   HCO3 21.3 06/23/2024 1938   TCO2 22 06/23/2024 1938   ACIDBASEDEF 4.0 (H) 06/23/2024 1938   O2SAT 99 06/23/2024 1938   CBG (last 3)  Recent Labs    06/26/24 1547 06/26/24 2101 06/27/24 0629  GLUCAP 175* 217* 87    Assessment/Plan: S/P Procedure(s) (LRB): CORONARY ARTERY BYPASS GRAFTING (CABG) TIMES FOUR USING LEFT INTERNAL MAMMARY ARTERY AND  ENDOSCOPICALLY HARVESTED RIGHT GREATER SAPHENOUS VEIN (N/A) REPLACEMENT, AORTIC VALVE, OPEN USING INSPIRIS RESILIA  AORTIC TISSUE VALVE (N/A) ECHOCARDIOGRAM, TRANSESOPHAGEAL, INTRAOPERATIVE (N/A)  POD 4 Hemodynamically stable in sinus rhythm on po amio for periop atrial fib. No further atrial fib postop. Will continue until I see him back in the office. Have not put him on beta blocker since BP low 100's and sometimes in the 90's.  Wt is 3 lbs over preop. Will give lasix  for two more days. Replace K+.  DM: glucose under reasonable control. Will resume previous insulin  and Jardiance  at discharge with followup with his PCP.  Continue IS, ambulation today. Plan home tomorrow with his family. They will not be available until tomorrow.  LOS: 4 days    Dorise MARLA Fellers 06/27/2024

## 2024-06-28 ENCOUNTER — Other Ambulatory Visit (HOSPITAL_COMMUNITY): Payer: Self-pay

## 2024-06-28 LAB — GLUCOSE, CAPILLARY: Glucose-Capillary: 91 mg/dL (ref 70–99)

## 2024-06-28 MED ORDER — ACETAMINOPHEN 500 MG PO TABS: 500.0000 mg | ORAL_TABLET | Freq: Four times a day (QID) | ORAL | Status: AC | PRN

## 2024-06-28 MED ORDER — AMIODARONE HCL 200 MG PO TABS
200.0000 mg | ORAL_TABLET | Freq: Two times a day (BID) | ORAL | 1 refills | Status: DC
  Filled 2024-06-28: qty 60, 53d supply, fill #0

## 2024-06-28 MED ORDER — OXYCODONE HCL 5 MG PO TABS
5.0000 mg | ORAL_TABLET | ORAL | 0 refills | Status: DC | PRN
  Filled 2024-06-28: qty 30, 5d supply, fill #0

## 2024-06-28 MED ORDER — ROSUVASTATIN CALCIUM 20 MG PO TABS
20.0000 mg | ORAL_TABLET | Freq: Every day | ORAL | 2 refills | Status: AC
  Filled 2024-06-28: qty 30, 30d supply, fill #0

## 2024-06-28 NOTE — TOC CM/SW Note (Addendum)
 The patient cannot safely or independently use standard toilet facilities because of s/p CABG, AVR. The patient's limitations include weakness, reduced mobility, sternal precautions, and/or pain]. These limitations make transferring to and from a low toilet seat a fall risk and physically unsafe and an elevated toilet seat would help reduce the risk and/or eliminate the risk.    Due to pt s/p CABG, AVR patient requires frequent body changes in body position and/or has an immediate need for change in body position. The patient requires positioning of the body in ways not feasible with an ordianry bed to alleviate pain post surgery. Patient can make adjustments with bed controls independently.

## 2024-06-28 NOTE — Progress Notes (Addendum)
      [address]       [place] [address]             [phone]      5 Days Post-Op Procedure(s) (LRB): CORONARY ARTERY BYPASS GRAFTING (CABG) TIMES FOUR USING LEFT INTERNAL MAMMARY ARTERY AND ENDOSCOPICALLY HARVESTED RIGHT GREATER SAPHENOUS VEIN (N/A) REPLACEMENT, AORTIC VALVE, OPEN USING INSPIRIS RESILIA  AORTIC TISSUE VALVE (N/A) ECHOCARDIOGRAM, TRANSESOPHAGEAL, INTRAOPERATIVE (N/A)  Subjective:  Patient sitting up in chair. He is doing well.  His pain is well controlled.  He is ambulating and has moved his bowels.  He is requesting a hospital bed, elevated commode seat, and a home health aide.  Objective: Vital signs in last 24 hours: Temp:  [97.5 F (36.4 C)-98.8 F (37.1 C)] 98.5 F (36.9 C) (08/25 2256) Pulse Rate:  [88-101] 88 (08/26 0320) Cardiac Rhythm: Normal sinus rhythm (08/25 1900) Resp:  [15-22] 15 (08/26 0320) BP: (97-122)/(65-81) 97/65 (08/25 2256) SpO2:  [93 %-100 %] 95 % (08/26 0320) Weight:  [80.6 kg] 80.6 kg (08/26 0500)  Intake/Output from previous day: 08/25 0701 - 08/26 0700 In: 240 [P.O.:240] Out: 1100 [Urine:1100]  General appearance: alert, cooperative, and no distress Heart: regular rate and rhythm Lungs: diminished breath sounds bibasilar Abdomen: soft, non-tender; bowel sounds normal; no masses,  no organomegaly Extremities: edema mild pitting Wound: clean and dry, ecchymosis across sternotomy  Lab Results: Recent Labs    06/26/24 0350  WBC 7.8  HGB 9.3*  HCT 28.8*  PLT 99*   BMET:  Recent Labs    06/26/24 0350 06/27/24 0341  NA 137 136  K 3.9 3.3*  CL 106 106  CO2 25 24  GLUCOSE 97 86  BUN 17 14  CREATININE 1.13 1.00  CALCIUM  8.0* 7.9*    PT/INR: No results for input(s): LABPROT, INR in the last 72 hours. ABG    Component Value Date/Time   PHART 7.358 06/23/2024 1938   HCO3 21.3 06/23/2024 1938   TCO2 22 06/23/2024 1938   ACIDBASEDEF 4.0 (H) 06/23/2024 1938   O2SAT 99 06/23/2024 1938    CBG (last 3)  Recent Labs    06/27/24 1731 06/27/24 2105 06/28/24 0549  GLUCAP 188* 159* 91    Assessment/Plan: S/P Procedure(s) (LRB): CORONARY ARTERY BYPASS GRAFTING (CABG) TIMES FOUR USING LEFT INTERNAL MAMMARY ARTERY AND ENDOSCOPICALLY HARVESTED RIGHT GREATER SAPHENOUS VEIN (N/A) REPLACEMENT, AORTIC VALVE, OPEN USING INSPIRIS RESILIA  AORTIC TISSUE VALVE (N/A) ECHOCARDIOGRAM, TRANSESOPHAGEAL, INTRAOPERATIVE (N/A)  CV-NSR, BP is labile 90-110- continue Amiodarone , no BB until BP improves Pulm- no acute issues, continue IS Renal- weight is trending down, mild edema on exam, continue Lasix , potassium for now DM- sugars mostly controlled, continue current insulin  regimen Dispo- patient stable, will plan to discharge home today.. patient requesting Hospital Bed, elevated commode, and home health aide as he lives alone.. some of these services may be offered through [facility].. however, I explained that it may be difficulty to get assistance with meal preparation, bathing etc.   LOS: 5 days    Rocky Shad, PA-C 06/28/2024   Chart reviewed, patient examined, agree with above.  Doing well. Plan home today.

## 2024-06-28 NOTE — TOC Transition Note (Signed)
 Transition of Care (TOC) - Discharge Note Rayfield Gobble RN, BSN Inpatient Care Management Unit 4E- RN Case Manager See Treatment Team for direct phone #   Patient Details  Name: Gordon Robinson MRN: [id number] Date of Birth: 1951-05-22  Transition of Care [facility]) CM/SW Contact:  Gobble Rayfield Hurst, RN Phone Number: 06/28/2024, 10:04 AM   Clinical Narrative:    Pt stable for transition home today, Orders placed for DME and HH needs. Note that pt asking about aide assistance for home.   CM spoke with pt at bedside to discuss HH/DME needs.  Per pt he has King sized bed at home and feels he needs hospital bed to safely get in/out of bed and position himself s/p CABG. He also is requesting elevated toilet seat. Pt reports he has RW at home. Discussed insurance coverage and pt also states he has [facility] benefits. Pt would like to see if his [facility] plan will cover DME as he will get DME quicker, if not then he is agreeable to send request to the [facility] to process DME for delivery.   TCTS office made referral to [facility] for any HH needs- liaison to follow up with pt post discharge- discussed this with pt who is agreeable to [facility] with [facility]. He also asked about aide assistance, explained that his [facility] plan did not cover aide for cooking/cleaning/etc however he could f/u with the [facility] to see if he could get this type of assistance. Pt voiced his son has arranged some meals on wheels and he will see how it goes- He will need to follow up with his Primary care at the [facility] should he decide he wants home care/aide services.   Address, phone # confirmed.  Pt goes to [facility].   Voiced he has a friend coming to transport him home.   Call made to [employer] liaison for DME referral- [employer] to process for coverage and delivery- liaison will let this CM know if [facility] plan does not cover so referral can be sent to [facility] instead.   No further IP CM needs noted. [facility] liaison updated on discharge for start of care.     Final next level of care: Home w Home Health Services Barriers to Discharge: No Barriers Identified   Patient Goals and CMS Choice Patient states their goals for this hospitalization and ongoing recovery are:: return home   Choice offered to / list presented to : Patient      Discharge Placement                 Home w/ [facility]      Discharge Plan and Services Additional resources added to the After Visit Summary for     Discharge Planning Services: CM Consult Post Acute Care Choice: Durable Medical Equipment, Home Health          DME Arranged: Eelevated commode seat, Hospital bed DME Agency: [facility] Date DME Agency Contacted: 06/28/24 Time DME Agency Contacted: 1004 Representative spoke with at DME Agency: Zack HH Arranged: RN, PT HH Agency: [facility]) Date HH Agency Contacted: 06/27/24 Time HH Agency Contacted: 1135 Representative spoke with at [facility] Agency: Zeb[facility]ee  Social Drivers of Health (SDOH) Interventions SDOH Screenings   Food Insecurity: No Food Insecurity (06/26/2024)  Housing: Low Risk  (06/27/2024)  Transportation Needs: No Transportation Needs (06/26/2024)  Utilities: Not At Risk (06/26/2024)  Social Connections: Unknown (06/27/2024)  Tobacco Use: Medium Risk (06/23/2024)     Readmission Risk Interventions  06/28/2024   10:04 AM  Readmission Risk Prevention Plan  Post Dischage Appt Complete  Medication Screening Complete  Transportation Screening Complete

## 2024-06-28 NOTE — Plan of Care (Signed)

## 2024-06-29 DIAGNOSIS — Z48812 Encounter for surgical aftercare following surgery on the circulatory system: Secondary | ICD-10-CM | POA: Diagnosis not present

## 2024-06-29 MED FILL — Potassium Chloride Inj 2 mEq/ML: INTRAVENOUS | Qty: 40 | Status: AC

## 2024-06-29 MED FILL — Magnesium Sulfate Inj 50%: INTRAMUSCULAR | Qty: 10 | Status: AC

## 2024-06-29 MED FILL — Heparin Sodium (Porcine) Inj 1000 Unit/ML: Qty: 1000 | Status: AC

## 2024-07-01 MED FILL — Electrolyte-R (PH 7.4) Solution: INTRAVENOUS | Qty: 4000 | Status: AC

## 2024-07-01 MED FILL — Sodium Bicarbonate IV Soln 8.4%: INTRAVENOUS | Qty: 50 | Status: AC

## 2024-07-01 MED FILL — Heparin Sodium (Porcine) Inj 1000 Unit/ML: INTRAMUSCULAR | Qty: 20 | Status: AC

## 2024-07-01 MED FILL — Sodium Chloride Irrigation Soln 0.9%: Qty: 6000 | Status: AC

## 2024-07-01 MED FILL — Calcium Chloride Inj 10%: INTRAVENOUS | Qty: 10 | Status: AC

## 2024-07-01 MED FILL — Mannitol IV Soln 20%: INTRAVENOUS | Qty: 500 | Status: AC

## 2024-07-05 ENCOUNTER — Ambulatory Visit: Attending: Surgery

## 2024-07-05 DIAGNOSIS — Z4802 Encounter for removal of sutures: Secondary | ICD-10-CM

## 2024-07-05 NOTE — Progress Notes (Unsigned)
 Patient arrived for nurse visit to remove sutures post- procedure CABG/ AVR 8/21 with Dr. Lucas.  Sutures removed with no signs/ symptoms of infection noted.  Patient tolerated procedure well.  Patient/ family instructed to keep the incision sites clean and dry.  Patient/ family acknowledged instructions given.

## 2024-07-07 NOTE — Progress Notes (Unsigned)
 Cardiology Office Note    Date:  07/08/2024  ID:  Gordon Robinson, DOB 26-Apr-1951, MRN [id number] Cardiologist: Diannah SHAUNNA Maywood, MD { :  History of Present Illness:    Gordon Robinson is a 73 y.o. 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  He was examined by Dr. Mallipeddi in 04/2024 and echocardiogram had shown his EF was reduced at 40 to 45% with severe aortic valve stenosis and R/LHC had been recommended but he preferred to have this with the [facility]. The [facility] had performed an [facility] Myoview which showed no evidence of ischemia but repeat echocardiogram showed his aortic stenosis remained in a severe range. Was referred to the Structural Heart team for further assessment. He did meet with Dr. Wonda later that month and ultimately underwent cardiac catheterization in 05/2024 which showed known severe AS and three-vessel CAD with nonobstructive stenosis of the dominant RCA, severe stenosis of the large OM branch of the LCx and severe bifurcation stenosis of the LAD/D1. He was referred to CT Surgery for consideration of AVR and CABG. He ultimately underwent CABG and AVR on 06/23/2024 by Dr. Lucas with LIMA-LAD, SVG-PDA, SVG-OM and SVG-Diagonal and AVR was performed with a 25 mm Edwards Resilia Bioprosthetic Valve. He did have post-operative atrial fibrillation and initially required IV Amiodarone  but this was transitioned to PO Amiodarone  200 mg twice daily for 7 days then 200 mg daily at discharge. He overall progressed well in the postoperative setting and was discharged home on 06/28/2024. Medications at that time included his Amiodarone  taper, ASA 81 mg daily and Crestor  20 mg daily. Was not started on a beta-blocker or ARB given his soft blood pressure.  In talking the patient, his brother and sister-in-law today, he reports things have been going well since his recent  hospitalization and surgery. He has been trying to walk down his road for exercise and is doing exercises in his home. Still having some incisional pain but no exertional chest pain. Taking Oxycodone  less than once a day.  Still having some issues with constipation and has been using MiraLAX  and we reviewed he could add Senokot as well. Breathing has been stable and no orthopnea, PND or pitting edema. No recent palpitations.   Studies Reviewed:   EKG: EKG is ordered today and demonstrates:   EKG Interpretation Date/Time:  Friday July 08 2024 13:51:31 EDT Ventricular Rate:  83 PR Interval:  162 QRS Duration:  120 QT Interval:  416 QTC Calculation: 488 R Axis:   65  Text Interpretation: Normal sinus rhythm Incomplete left bundle branch block TWI along inferior and lateral leads. Confirmed by Johnson Grate ([id number]) on 07/08/2024 2:09:19 PM       Echocardiogram: 01/2024 IMPRESSIONS     1. Left ventricular ejection fraction, by estimation, is 40 to 45%. Left  ventricular ejection fraction by 3D volume is 42 %. The left ventricle has  mildly decreased function. The left ventricle demonstrates global  hypokinesis. There is moderate left  ventricular hypertrophy. Left ventricular diastolic parameters are  consistent with Grade I diastolic dysfunction (impaired relaxation).  Elevated left ventricular end-diastolic pressure. The average left  ventricular global longitudinal strain is -11.2 %.  The global longitudinal strain is abnormal.   2. Right ventricular systolic function is normal. The right ventricular  size is normal. There is normal pulmonary artery systolic pressure.  3. The mitral valve is normal in structure. Trivial mitral valve  regurgitation. No evidence of mitral stenosis.   4. The aortic valve is tricuspid. There is severe calcifcation of the  aortic valve. Aortic valve regurgitation is moderate. Severe aortic valve  stenosis. Aortic regurgitation PHT measures  381 msec. Aortic valve area,  by VTI measures 0.78 cm. Aortic  valve mean gradient measures 40.0 mmHg. Aortic valve Vmax measures 3.87  m/s. DVI is 0.24.   5. The inferior vena cava is normal in size with greater than 50%  respiratory variability, suggesting right atrial pressure of 3 mmHg.   Comparison(s): No prior Echocardiogram.    Cardiac Catheterization: 05/2024 1.  Known severe aortic stenosis by noninvasive assessment.  Aortic valve is calcified and restricted on plain fluoroscopy. 2.  Three-vessel coronary artery disease with nonobstructive stenosis of the dominant RCA, severe stenosis of the large first OM branch of the circumflex, and severe bifurcation stenosis of the LAD/first diagonal 3.  Right heart hemodynamics: RA a mean 3 mmHg RV 34/5 mmHg PA 28/12 mean 19 mmHg Cardiac output 4.7 L/min Cardiac index 2.35 L/min/m   Recommendations: Cardiac surgical consultation for consideration of aortic valve replacement/CABG  Risk Assessment/Calculations:   CHA2DS2-VASc Score = 4  This indicates a 4.8% annual risk of stroke. The patient's score is based upon: CHF History: 1 HTN History: 0 Diabetes History: 1 Stroke History: 0 Vascular Disease History: 1 Age Score: 1 Gender Score: 0    Physical Exam:   VS:  BP 118/82 (BP Location: Left Arm, Cuff Size: Normal)   Pulse 83   Ht 5' 11 (1.803 m)   Wt 178 lb (80.7 kg)   BMI 24.83 kg/m    Wt Readings from Last 3 Encounters:  07/08/24 178 lb (80.7 kg)  06/28/24 177 lb 11.1 oz (80.6 kg)  06/21/24 178 lb (80.7 kg)     GEN: Well nourished, well developed male appearing in no acute distress NECK: No JVD; No carotid bruits CARDIAC: RRR, no murmurs, rubs, gallops. Sternal incision appears to be healing well with no erythema or drainage.  RESPIRATORY:  Clear to auscultation without rales, wheezing or rhonchi  ABDOMEN: Appears non-distended. No obvious abdominal masses. EXTREMITIES: No clubbing or cyanosis. No pitting  edema.  Distal pedal pulses are 2+ bilaterally.   Assessment and Plan:   1. Coronary artery disease involving native coronary artery of native heart without angina pectoris - He is s/p CABG on 06/23/2024 with LIMA-LAD, SVG-PDA, SVG-OM and SVG-Diagonal. He has been gradually increasing his activity level and denies any specific anginal symptoms. Continue ASA 81 mg daily and Crestor  20 mg daily. Encouraged him to keep scheduled follow-up with CT Surgery later this month.   2. S/P AVR - He did have severe aortic stenosis by recent echocardiogram and cardiac catheterization. Underwent AVR with a 25 mm Edwards Resilia bioprosthetic valve as outlined above. He is scheduled for a follow-up echocardiogram on 07/27/2024.  3. Chronic HFmrEF - His EF was at 40 to 45% by echocardiogram in 01/2024 with similar results by TEE at the time of CABG and AVR. He is scheduled for a follow-up echocardiogram later this month. If EF remains reduced, would gradually add GDMT. Previously limited by BP but this has improved and is at 118/82 today. Could likely restart SGLT2i therapy and possibly add low-dose BB. Will await results of echo.   4. Postoperative atrial fibrillation  - He did experience atrial fibrillation following CABG/AVR and converted back to normal sinus  rhythm with IV Amiodarone . Maintaining normal sinus rhythm by repeat EKG today. Will continue Amiodarone  for a total of 2 months and then discontinue. Will anticipate stopping on 08/23/2024 unless recurrent symptoms in the interim.  5. Hyperlipidemia LDL goal <55 - Will request a copy of most recent labs from his PCP as I am unable to locate a recent LDL in the system. Goal LDL should be less than 55. Remains on Crestor  20 mg daily for now. If LDL is above goal, would titrate to 40 mg daily.  6. IDDM - Hgb A1c was significantly elevated at 13.9 when checked in 06/2024. Followed by PCP and he remains on Lantus  at this time.  Cardiac Rehabilitation  Eligibility Assessment  The patient is ready to start cardiac rehabilitation pending clearance from the cardiac surgeon.    Signed, Laymon CHRISTELLA Qua, PA-C

## 2024-07-08 ENCOUNTER — Encounter: Payer: Self-pay | Admitting: Student

## 2024-07-08 ENCOUNTER — Ambulatory Visit: Attending: Student | Admitting: Student

## 2024-07-08 ENCOUNTER — Encounter: Payer: Self-pay | Admitting: *Deleted

## 2024-07-08 VITALS — BP 118/82 | HR 83 | Ht 71.0 in | Wt 178.0 lb

## 2024-07-08 DIAGNOSIS — I9789 Other postprocedural complications and disorders of the circulatory system, not elsewhere classified: Secondary | ICD-10-CM

## 2024-07-08 DIAGNOSIS — I5022 Chronic systolic (congestive) heart failure: Secondary | ICD-10-CM | POA: Diagnosis not present

## 2024-07-08 DIAGNOSIS — I2510 Atherosclerotic heart disease of native coronary artery without angina pectoris: Secondary | ICD-10-CM | POA: Diagnosis not present

## 2024-07-08 DIAGNOSIS — Z952 Presence of prosthetic heart valve: Secondary | ICD-10-CM | POA: Diagnosis not present

## 2024-07-08 DIAGNOSIS — Z794 Long term (current) use of insulin: Secondary | ICD-10-CM

## 2024-07-08 DIAGNOSIS — E785 Hyperlipidemia, unspecified: Secondary | ICD-10-CM

## 2024-07-08 DIAGNOSIS — I4891 Unspecified atrial fibrillation: Secondary | ICD-10-CM

## 2024-07-08 DIAGNOSIS — E119 Type 2 diabetes mellitus without complications: Secondary | ICD-10-CM

## 2024-07-08 MED ORDER — AMIODARONE HCL 200 MG PO TABS: 200.0000 mg | ORAL_TABLET | Freq: Every day | ORAL | 0 refills | Status: DC

## 2024-07-08 NOTE — Patient Instructions (Signed)
 Medication Instructions:  Your physician has recommended you make the following change in your medication:   Stop Taking Amiodarone  on August 23, 2024  *If you need a refill on your cardiac medications before your next appointment, please call your pharmacy*  Lab Work: NONE   If you have labs (blood work) drawn today and your tests are completely normal, you will receive your results only by: MyChart Message (if you have MyChart) OR A paper copy in the mail If you have any lab test that is abnormal or we need to change your treatment, we will call you to review the results.  Testing/Procedures: NONE    Follow-Up: At [facility], you and your health needs are our priority.  As part of our continuing mission to provide you with exceptional heart care, our providers are all part of one team.  This team includes your primary Cardiologist (physician) and Advanced Practice Providers or APPs (Physician Assistants and Nurse Practitioners) who all work together to provide you with the care you need, when you need it.  Your next appointment:   3 month(s)  Provider:   Vishnu Mallipeddi, MD or Laymon Qua, PA-C    We recommend signing up for the patient portal called MyChart.  Sign up information is provided on this After Visit Summary.  MyChart is used to connect with patients for Virtual Visits (Telemedicine).  Patients are able to view lab/test results, encounter notes, upcoming appointments, etc.  Non-urgent messages can be sent to your provider as well.   To learn more about what you can do with MyChart, go to [email or web address].   Other Instructions Thank you for choosing [facility]!

## 2024-07-11 ENCOUNTER — Telehealth (HOSPITAL_COMMUNITY): Payer: Self-pay | Admitting: Licensed Clinical Social Worker

## 2024-07-11 ENCOUNTER — Telehealth (HOSPITAL_COMMUNITY): Payer: Self-pay

## 2024-07-11 NOTE — Telephone Encounter (Signed)
 Called patient regarding cardiac rehab referral for CABG/AV replacement. He says he would like to get a referral through the [facility]. He stated he would contact his social worker at the [facility] to request a referral for cardiac rehab to be sent to [facility].

## 2024-07-11 NOTE — Telephone Encounter (Signed)
 H&V Care Navigation CSW Progress Note  Clinical Social Worker consulted to speak with pt regarding transportation concerns.  Pt reports he normally gets to appts through [facility] [employer] transportation but that he has run out of rides for the year.  Pt is a Veteran so encouraged to call them to discuss transportation options.  Also mailing patient information about [facility] which he can utilize for [employer] and has a shuttle to the [facility] on Mondays.  Pt reports no other concerns at this time.  SDOH Screenings   Food Insecurity: No Food Insecurity (06/26/2024)  Housing: Low Risk  (06/27/2024)  Transportation Needs: No Transportation Needs (07/11/2024)  Utilities: Not At Risk (06/26/2024)  Social Connections: Unknown (06/27/2024)  Tobacco Use: Medium Risk (07/08/2024)   Andriette HILARIO Leech, LCSW Clinical Social Worker [facility] Desk#: [phone] Cell#: [phone]

## 2024-07-13 NOTE — Progress Notes (Signed)
 Medical record documentation: He has a history of chronic kidney disease documented in his medical record multiple times but his GFR has been >60 recently and he probably has Stage 1 CKD at most.

## 2024-07-14 ENCOUNTER — Other Ambulatory Visit: Payer: Self-pay

## 2024-07-14 ENCOUNTER — Telehealth: Payer: Self-pay

## 2024-07-14 MED ORDER — TRAMADOL HCL 50 MG PO TABS: 50.0000 mg | ORAL_TABLET | Freq: Four times a day (QID) | ORAL | 0 refills | Status: AC | PRN

## 2024-07-14 NOTE — Telephone Encounter (Signed)
-----   Message from Manuelita HERO [facility] sent at 07/14/2024  1:04 PM EDT ----- Regarding: RE: pain med refill request I sent in 3 days worth of tramadol  for patient at this time. ----- Message ----- From: Brien Devere SAUNDERS, LPN Sent: [id number]  10:53 AM EDT To: Dorise MARLA Fellers, MD; Manuelita HERO Rough, PA-C Subject: pain med refill request                        Gordon Robinson is calling for a refill on Oxycodone  RX. His pharm is [facility] /noted in chart./ He is sch'ed to see LMF on 9/17 for post op visit. S/p CABG x4 on 06/23/24 Please advise SW

## 2024-07-14 NOTE — Progress Notes (Signed)
-  Sent in tramadol  50 mg every 6 hours for severe pain for patient -RX sent to [id number] - [place], [place]  -Continue to use tylenol  as needed for pain

## 2024-07-19 ENCOUNTER — Other Ambulatory Visit: Payer: Self-pay | Admitting: Surgery

## 2024-07-19 DIAGNOSIS — I350 Nonrheumatic aortic (valve) stenosis: Secondary | ICD-10-CM

## 2024-07-20 ENCOUNTER — Ambulatory Visit (INDEPENDENT_AMBULATORY_CARE_PROVIDER_SITE_OTHER): Payer: Self-pay

## 2024-07-20 ENCOUNTER — Ambulatory Visit: Payer: Self-pay | Admitting: Student

## 2024-07-20 ENCOUNTER — Ambulatory Visit
Admission: RE | Admit: 2024-07-20 | Discharge: 2024-07-20 | Disposition: A | Discharge status: Home / Self Care | Source: Ambulatory Visit | Attending: Cardiovascular Disease | Admitting: Cardiovascular Disease

## 2024-07-20 ENCOUNTER — Encounter: Payer: Self-pay | Admitting: Family Medicine

## 2024-07-20 VITALS — BP 146/89 | HR 85 | Resp 20 | Ht 71.0 in | Wt 181.0 lb

## 2024-07-20 DIAGNOSIS — Z952 Presence of prosthetic heart valve: Secondary | ICD-10-CM | POA: Diagnosis present

## 2024-07-20 DIAGNOSIS — Z951 Presence of aortocoronary bypass graft: Secondary | ICD-10-CM

## 2024-07-20 DIAGNOSIS — I350 Nonrheumatic aortic (valve) stenosis: Secondary | ICD-10-CM | POA: Diagnosis present

## 2024-07-20 NOTE — Progress Notes (Signed)
 [address] [place] [address]             [phone]       HPI:  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 Lucas.  The patient's early postoperative recovery while in the hospital was notable for developing atrial fibrillation.  He was transitioned from IV to oral amiodarone . He was diuresed with IV lasix  which was transitioned to oral.  He developed mild hypokalemia and was supplemented appropriately. He was stable for discharge home on 06/28/2024.   Since hospital discharge the patient reports that he has been doing well.  He does still experience some incisional chest pain that has started to improve.  He has been using tylenol  and takes tramadol  only when the pain is extreme.  He has been walking 2 miles everyday for exercise.  He denies chest pain, shortness of breath and lower leg swelling.    Allergies as of 07/20/2024   No Known Allergies      Medication List        Accurate as of July 20, 2024  1:16 PM. If you have any questions, ask your nurse or doctor.          acetaminophen  500 MG tablet Commonly known as: TYLENOL  Take 1-2 tablets (500-1,000 mg total) by mouth every 6 (six) hours as needed.   amiodarone  200 MG tablet Commonly known as: PACERONE  Take 1 tablet (200 mg total) by mouth daily.   aspirin  EC 81 MG tablet Take 1 tablet (81 mg total) by mouth daily. Swallow whole.   diclofenac Sodium 1 % Gel Commonly known as: VOLTAREN Apply 2 g topically 3  (three) times daily as needed (pain).   fluticasone  50 MCG/ACT nasal spray Commonly known as: FLONASE  Place 2 sprays into both nostrils daily.   insulin  glargine 100 UNIT/ML injection Commonly known as: LANTUS  Inject 16-18 Units into the skin every morning.   levothyroxine  137 MCG tablet Commonly known as: SYNTHROID  Take 137 mcg by mouth daily before breakfast.   multivitamin tablet Take 1 tablet by mouth daily.   polyethylene glycol powder 17 GM/SCOOP powder Commonly known as: MiraLax  Take 17 g by mouth 2 (two) times daily as needed for moderate constipation.   rosuvastatin  20 MG tablet Commonly known as: CRESTOR  Take 1 tablet (20 mg total) by mouth daily.   tamsulosin  0.4 MG Caps capsule Commonly known as: FLOMAX  Take 0.4 mg by mouth at bedtime as needed (Prostate). What changed: when to take this         ROS  Review of Systems  Constitutional: Negative.  Negative for fever and malaise/fatigue.  Respiratory: Negative.  Negative for cough, shortness of breath and wheezing.   Cardiovascular:  Negative for chest pain, palpitations and leg swelling.  Neurological: Negative.  Negative for headaches.      BP (!) 146/89   Pulse 85   Resp 20   Ht 5' 11 (1.803 m)   Wt 181 lb (82.1 kg)   SpO2 93% Comment:  RA  BMI 25.24 kg/m    Physical Exam Constitutional:      Appearance: Normal appearance.  HENT:     Head: Normocephalic and atraumatic.  Cardiovascular:     Rate and Rhythm: Normal rate and regular rhythm.     Heart sounds: Normal heart sounds, S1 normal and S2 normal.  Pulmonary:     Effort: Pulmonary effort is normal.     Breath sounds: Normal breath sounds.  Skin:    General: Skin is warm and dry.      Neurological:     General: No focal deficit present.     Mental Status: He is alert and oriented to person, place, and time.      Imaging: CLINICAL DATA:  Status post CABG.   EXAM: CHEST - 2 VIEW   COMPARISON:  Chest radiograph dated  06/25/2024.   FINDINGS: There is diffuse chronic intra coarsening and bronchitic changes. Improved aeration of the lung bases. No focal consolidation, pleural effusion or pneumothorax. Mild cardiomegaly. Median sternotomy wires and mechanical cardiac valve. No acute osseous pathology.   IMPRESSION: 1. No acute cardiopulmonary process. 2. Mild cardiomegaly.     Electronically Signed   By: Vanetta Chou M.D.   On: 07/20/2024 13:02   Assessment/Plan:  S/P CABG x 4 We reviewed today's chest x ray. We discussed driving and he is able to start at this time since he is not using narcotics for pain daily.  Discussed that if he does take tramadol  then he should not drive that day. First time driving should be a short distance in the day time and he can increase from there.  He would like to participate in cardiac rehab and he is cleared to do so at this time.  Discussed the continuance of sternal precautions until full 6 weeks from surgery.    He has been seen by cardiology and is to continue aspirin  81 mg and Crestor  20 mg. He will remain on amiodarone  for 2 months.  If EF on echo is reduced then GDMT would be added gradually.   Diabetes is not well controlled at this time and he is followed by his PCP.   S/P AVR -Echocardiogram scheduled for 07/27/2024  -Discussed need for prophylactic antibiotics for dental procedures, cleaning and minor procedures   -Follow up scheduled for after echocardiogram   Gordon CHRISTELLA Rough, PA-C 1:16 PM 07/20/24

## 2024-07-20 NOTE — Patient Instructions (Signed)
 You may return to driving an automobile as long as you are no longer requiring oral narcotic pain relievers during the daytime.  It would be wise to start driving only short distances during the daylight and gradually increase from there as you feel comfortable.   You may continue to gradually increase your physical activity as tolerated.  Refrain from any heavy lifting or strenuous use of your arms and shoulders until at least 6 weeks from the time of your surgery, and avoid activities that cause increased pain in your chest on the side of your surgical incision.  Otherwise you may continue to increase activities without any particular limitations.  Increase the intensity and duration of physical activity gradually.   Endocarditis is a potentially serious infection of heart valves or inside lining of the heart.  It occurs more commonly in patients with diseased heart valves (such as patient's with aortic or mitral valve disease) and in patients who have undergone heart valve repair or replacement.  Certain surgical and dental procedures may put you at risk, such as dental cleaning, other dental procedures, or any surgery involving the respiratory, urinary, gastrointestinal tract, gallbladder or prostate gland.   To minimize your chances for develooping endocarditis, maintain good oral health and seek prompt medical attention for any infections involving the mouth, teeth, gums, skin or urinary tract.    Always notify your doctor or dentist about your underlying heart valve condition before having any invasive procedures. You will need to take antibiotics before certain procedures, including all routine dental cleanings or other dental procedures.  Your cardiologist or dentist should prescribe these antibiotics for you to be taken ahead of time.

## 2024-07-26 ENCOUNTER — Other Ambulatory Visit (HOSPITAL_COMMUNITY): Payer: Self-pay | Admitting: *Deleted

## 2024-07-27 ENCOUNTER — Ambulatory Visit: Payer: Self-pay | Admitting: Surgery

## 2024-07-27 ENCOUNTER — Ambulatory Visit (HOSPITAL_COMMUNITY)
Admission: RE | Admit: 2024-07-27 | Discharge: 2024-07-27 | Disposition: A | Discharge status: Home / Self Care | Source: Ambulatory Visit | Attending: Family Medicine | Admitting: Family Medicine

## 2024-07-27 DIAGNOSIS — Z952 Presence of prosthetic heart valve: Secondary | ICD-10-CM | POA: Diagnosis present

## 2024-07-27 LAB — ECHOCARDIOGRAM COMPLETE
AR max vel: 1.36 cm2
AV Area VTI: 1.63 cm2
AV Area mean vel: 1.64 cm2
AV Mean grad: 5 mmHg
AV Peak grad: 10.8 mmHg
Ao pk vel: 1.64 m/s
Area-P 1/2: 2.99 cm2
Calc EF: 39.7 %
S' Lateral: 3.8 cm
Single Plane A2C EF: 46.3 %
Single Plane A4C EF: 30.5 %

## 2024-07-27 NOTE — Progress Notes (Signed)
*  PRELIMINARY RESULTS* Echocardiogram 2D Echocardiogram has been performed.  Gordon Robinson 07/27/2024, 11:20 AM

## 2024-07-28 ENCOUNTER — Telehealth: Payer: Self-pay | Admitting: Student

## 2024-07-28 NOTE — Telephone Encounter (Signed)
 Called pt. No answer. Left msg to call back.

## 2024-07-28 NOTE — Telephone Encounter (Signed)
    Please let the patient know I reviewed his echocardiogram results. The official report went to Dr. Lucas and they may reach out to him as well. Unfortunately, the pumping function of his heart remains reduced at 30 to 35%. Right ventricular function is normal and his aortic valve is functioning normally.  Given that the pumping function of his heart is reduced, I would recommend restarting Jardiance  at 10 mg daily. I can see that he is listed as being on insulin  and would encourage him to keep track of his blood sugar as dosing may need to be reduced with restarting Jardiance  because while this helps with the heart pumping function, it also impacts his blood sugar.  Has he been able to check his blood pressure at home? If so and SBP allows, would like to add a beta-blocker as well.   Signed, Laymon CHRISTELLA Qua, PA-C 07/28/2024, 7:35 AM Pager: [phone]

## 2024-07-29 MED ORDER — EMPAGLIFLOZIN 10 MG PO TABS: 10.0000 mg | ORAL_TABLET | Freq: Every day | ORAL | 11 refills | Status: AC

## 2024-07-29 NOTE — Telephone Encounter (Signed)
 Spoke to pt/pt's brother who verbalized understanding. Pt stated that he had not checked bp at home- encouraged pt to track bp and call office with readings.   Pt agreeable to restart Jardiance  10 mg once daily. RX sent to [facility] per pt's request. Pt will also track glucose while taking Jardiance .   Pt/Pt brother had no questions or concerns at this time.

## 2024-08-03 ENCOUNTER — Ambulatory Visit: Payer: Self-pay | Admitting: Internal Medicine

## 2024-08-03 ENCOUNTER — Ambulatory Visit: Payer: Self-pay | Attending: Surgery | Admitting: Surgery

## 2024-08-03 ENCOUNTER — Encounter: Payer: Self-pay | Admitting: Surgery

## 2024-08-03 VITALS — BP 127/87 | HR 100 | Resp 18 | Ht 71.0 in | Wt 177.0 lb

## 2024-08-03 DIAGNOSIS — Z951 Presence of aortocoronary bypass graft: Secondary | ICD-10-CM

## 2024-08-03 DIAGNOSIS — Z952 Presence of prosthetic heart valve: Secondary | ICD-10-CM

## 2024-08-03 NOTE — Progress Notes (Signed)
 error

## 2024-08-09 ENCOUNTER — Encounter

## 2024-08-09 NOTE — Progress Notes (Signed)
 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 The patient's early postoperative recovery while in the hospital was notable for development of postoperative atrial fibrillation converted with amiodarone . Since hospital discharge the patient reports that he has been feeling well.  He is walking daily without chest pain or shortness of breath and is planning on participating in cardiac rehab.   Current Outpatient Medications  Medication Sig Dispense Refill   acetaminophen  (TYLENOL ) 500 MG tablet Take 1-2 tablets (500-1,000 mg total) by mouth every 6 (six) hours as needed.     amiodarone  (PACERONE ) 200 MG tablet Take 1 tablet (200 mg total) by mouth daily. 46 tablet 0   aspirin  EC 81 MG tablet Take 1 tablet (81 mg total) by mouth daily. Swallow whole.     diclofenac Sodium (VOLTAREN) 1 % GEL Apply 2 g topically 3 (three) times daily as needed (pain).     empagliflozin  (JARDIANCE ) 10 MG TABS tablet Take 1 tablet (10 mg total) by mouth daily before breakfast. 30 tablet 11   fluticasone  (FLONASE ) 50 MCG/ACT nasal spray Place 2 sprays into both nostrils daily.     insulin  glargine (LANTUS ) 100 UNIT/ML injection Inject 16-18 Units into the skin every morning.     levothyroxine  (SYNTHROID ) 137 MCG tablet Take 137 mcg by mouth daily before breakfast.     Multiple Vitamin (MULTIVITAMIN) tablet Take 1 tablet by mouth daily.     polyethylene glycol powder (MIRALAX ) 17 GM/SCOOP powder Take 17 g by mouth 2 (two) times daily as needed for moderate constipation.     rosuvastatin  (CRESTOR ) 20 MG tablet Take 1 tablet (20 mg total) by mouth daily. 30 tablet 2   tamsulosin  (FLOMAX ) 0.4 MG CAPS capsule Take 0.4 mg by mouth at bedtime as needed (Prostate). (Patient  taking differently: Take 0.4 mg by mouth at bedtime.)     No current facility-administered medications for this visit.    Physical Exam: BP 127/87   Pulse 100   Resp 18   Ht 5' 11 (1.803 m)   Wt 177 lb (80.3 kg)   SpO2 95%   BMI 24.69 kg/m  He looks well. Cardiac exam shows a regular rate and rhythm with normal heart sounds.  There is no murmur. Lungs are clear. The chest incision is healing well and the sternum is stable. His leg incision is healing well and there is no lower extremity edema.  Diagnostic Tests:  ECHOCARDIOGRAM REPORT       Patient Name:   Gordon Robinson Date of Exam: 07/27/2024  Medical Rec #:  [id number]     Height:       71.0 in  Accession #:    [id number]    Weight:       181.0 lb  Date of Birth:  14-Aug-1951      BSA:          2.021 m  Patient Age:    73 years      BP:           126/86 mmHg  Patient Gender: M             HR:  99 bpm.  Exam Location:  [name]   Procedure: 2D Echo, 3D Echo, Cardiac Doppler, Color Doppler and Strain  Analysis            (Both Spectral and Color Flow Doppler were utilized during             procedure).   Indications:    S/P Aortic Valve replacement Z95.2    History:        Patient has prior history of Echocardiogram examinations,  most                 recent 01/07/2024. Cardiomyopathy and CHF, Prior CABG, COPD,                  Aortic Valve Disease; Risk Factors:Tobacco Abuse,  Hypertension,                 Diabetes and Dyslipidemia. S/P Aortic Valve replacement &  CABG                 06/23/24.                  Aortic Valve: 25 mm Edwards Resilia valve is present in  the                 aortic position.    Sonographer:    [name]  Referring Phys: [id number] DORISE MARLA FELLERS     Sonographer Comments: Global longitudinal strain was attempted.  IMPRESSIONS     1. Left ventricular ejection fraction, by estimation, is 30 to 35%. Left  ventricular ejection fraction by 3D volume is 36 %. The left  ventricle has  moderately decreased function. The left ventricle demonstrates regional  wall motion abnormalities (see  scoring diagram/findings for description). There is mild concentric left  ventricular hypertrophy. Left ventricular diastolic parameters are  indeterminate.   2. Right ventricular systolic function is normal. The right ventricular  size is normal. There is normal pulmonary artery systolic pressure. The  estimated right ventricular systolic pressure is 26.2 mmHg.   3. The mitral valve is degenerative. Trivial mitral valve regurgitation.   4. The aortic valve has been repaired/replaced. Aortic valve  regurgitation is not visualized. There is a 25 mm Edwards Resilia valve  present in the aortic position. Aortic valve mean gradient measures 5.0  mmHg.   5. The inferior vena cava is normal in size with greater than 50%  respiratory variability, suggesting right atrial pressure of 3 mmHg.   Comparison(s): Prior images reviewed side by side. LVEF has decreased to  the range of 30-35%. Interval placement of bioprosthetic AVR with normal  function, mean AV gradient 5 mmHg.   FINDINGS   Left Ventricle: Left ventricular ejection fraction, by estimation, is 30  to 35%. Left ventricular ejection fraction by 3D volume is 36 %. The left  ventricle has moderately decreased function. The left ventricle  demonstrates regional wall motion  abnormalities. The left ventricular internal cavity size was normal in  size. There is mild concentric left ventricular hypertrophy. Left  ventricular diastolic parameters are indeterminate.     LV Wall Scoring:  The anterior septum, mid inferoseptal segment, basal inferior segment, and  basal inferoseptal segment are akinetic. The entire anterior wall,  antero-lateral wall, entire apex, and mid and distal inferior wall are  hypokinetic. The posterior wall is normal.   Right Ventricle: The right ventricular size is normal. No increase in  right  ventricular wall thickness. Right ventricular  systolic function is  normal. There is normal pulmonary artery systolic pressure. The tricuspid  regurgitant velocity is 2.41 m/s, and   with an assumed right atrial pressure of 3 mmHg, the estimated right  ventricular systolic pressure is 26.2 mmHg.   Left Atrium: Left atrial size was normal in size.   Right Atrium: Right atrial size was normal in size.   Pericardium: There is no evidence of pericardial effusion.   Mitral Valve: The mitral valve is degenerative in appearance. There is  mild calcification of the mitral valve leaflet(s). Trivial mitral valve  regurgitation.   Tricuspid Valve: The tricuspid valve is grossly normal. Tricuspid valve  regurgitation is mild.   Aortic Valve: The aortic valve has been repaired/replaced. Aortic valve  regurgitation is not visualized. Aortic valve mean gradient measures 5.0  mmHg. Aortic valve peak gradient measures 10.8 mmHg. Aortic valve area, by  VTI measures 1.63 cm. There is a  25 mm Edwards Resilia valve present in the aortic position.   Pulmonic Valve: The pulmonic valve was grossly normal. Pulmonic valve  regurgitation is trivial.   Aorta: The aortic root is normal in size and structure.   Venous: The inferior vena cava is normal in size with greater than 50%  respiratory variability, suggesting right atrial pressure of 3 mmHg.   IAS/Shunts: No atrial level shunt detected by color flow Doppler.   Additional Comments: 3D was performed not requiring image post processing  on an independent workstation and was abnormal.     LEFT VENTRICLE  PLAX 2D  LVIDd:         5.00 cm         Diastology  LVIDs:         3.80 cm         LV e' medial:    7.21 cm/s  LV PW:         1.30 cm         LV E/e' medial:  9.7  LV IVS:        1.10 cm         LV e' lateral:   10.30 cm/s  LVOT diam:     1.80 cm         LV E/e' lateral: 6.8  LV SV:         43  LV SV Index:   21  LVOT Area:     2.54 cm         3D Volume EF                                 LV 3D EF:    Left                                              ventricul  LV Volumes (MOD)                            ar  LV vol d, MOD    101.0 ml                   ejection  A2C:  fraction  LV vol d, MOD    164.0 ml                   by 3D  A4C:                                        volume is  LV vol s, MOD    54.2 ml                    36 %.  A2C:  LV vol s, MOD    114.0 ml  A4C:                           3D Volume EF:  LV SV MOD A2C:   46.8 ml       3D EF:        36 %  LV SV MOD A4C:   164.0 ml      LV EDV:       208 ml  LV SV MOD BP:    51.5 ml       LV ESV:       134 ml                                 LV SV:        74 ml   RIGHT VENTRICLE  RV S prime:     7.34 cm/s  TAPSE (M-mode): 1.2 cm   LEFT ATRIUM             Index        RIGHT ATRIUM           Index  LA diam:        3.70 cm 1.83 cm/m   RA Area:     14.10 cm  LA Vol (A2C):   41.7 ml 20.63 ml/m  RA Volume:   35.60 ml  17.62 ml/m  LA Vol (A4C):   56.2 ml 27.81 ml/m  LA Biplane Vol: 53.1 ml 26.27 ml/m   AORTIC VALVE  AV Area (Vmax):    1.36 cm  AV Area (Vmean):   1.64 cm  AV Area (VTI):     1.63 cm  AV Vmax:           164.00 cm/s  AV Vmean:          102.000 cm/s  AV VTI:            0.265 m  AV Peak Grad:      10.8 mmHg  AV Mean Grad:      5.0 mmHg  LVOT Vmax:         87.80 cm/s  LVOT Vmean:        65.600 cm/s  LVOT VTI:          0.170 m  LVOT/AV VTI ratio: 0.64    AORTA  Ao Root diam: 3.40 cm   MITRAL VALVE                TRICUSPID VALVE  MV Area (PHT): 2.99 cm     TR Peak grad:   23.2 mmHg  MV Decel Time: 254 msec     TR Vmax:        241.00 cm/s  MV E velocity:  70.20 cm/s  MV A velocity: 112.00 cm/s  SHUNTS  MV E/A ratio:  0.63         Systemic VTI:  0.17 m                              Systemic Diam: 1.80 cm   Jayson Sierras MD  Electronically signed by Jayson Sierras MD  Signature Date/Time:  07/27/2024/12:15:33 PM        Final     Impression:  Overall he is doing very well 6 weeks following his surgery.  Echocardiogram shows a normally functioning prosthetic aortic valve with a mean gradient of 5 mmHg.  His left ventricular ejection fraction was measured at 30 to 35% compared to his intraoperative echo when his ejection fraction was noted to be 45%.  Hopefully this will improve with GDMT.  Plan:  He will continue to follow-up with cardiology and will return to see me if he has any problems with his incisions.   Dorise MARLA Fellers, MD [facility] [phone]

## 2024-08-12 ENCOUNTER — Other Ambulatory Visit: Payer: Self-pay | Admitting: Family Medicine

## 2024-08-12 DIAGNOSIS — Z8619 Personal history of other infectious and parasitic diseases: Secondary | ICD-10-CM

## 2024-08-17 ENCOUNTER — Ambulatory Visit: Attending: Student | Admitting: Student

## 2024-08-17 NOTE — Progress Notes (Deleted)
 Cardiology Office Note    Date:  08/17/2024  ID:  Gordon Robinson, DOB 1951/10/03, MRN [id number] Cardiologist: Diannah SHAUNNA Maywood, MD { :  History of Present Illness:    Gordon Robinson is a 73 y.o. 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  He was last examined by myself in 07/2024 following his recent admission for CABG and AVR.  He did develop postoperative atrial fibrillation but had converted back to normal sinus rhythm.  At the time of follow-up, he reported overall doing well and was walking down his road for exercise.  BP had previously limited further titration of GDMT and he was scheduled for a follow-up echocardiogram later that month for reassessment of his EF with plans to restart SGLT2 inhibitor therapy and possibly add a low-dose beta-blocker if EF remained reduced.  It had been recommended continue amiodarone  for total of 2 months and then discontinue, therefore was recommended to stop on 08/23/2024 unless recurrent symptoms in the interim.  Repeat echocardiogram showed that his EF was reduced at 30 to 35% and right ventricular function was normal and his aortic valve was functioning normally.  Was recommend to restart Jardiance  10 mg daily and he was encouraged to follow blood sugars at home with this adjustment.  Most recent visit was with Dr. Lucas on 08/03/2024 and he reported overall feeling well at that time.  Was encouraged to follow-up with CT Surgery as needed.  - beta blocker?  ROS: ***  Studies Reviewed:   EKG: EKG is*** ordered today and demonstrates ***   EKG Interpretation Date/Time:    Ventricular Rate:    PR Interval:    QRS Duration:    QT Interval:    QTC  Calculation:   R Axis:      Text Interpretation:         Cardiac Catheterization: 05/2024 1.  Known severe aortic stenosis by noninvasive assessment.  Aortic valve is calcified and restricted on plain fluoroscopy. 2.  Three-vessel coronary artery disease with nonobstructive stenosis of the dominant RCA, severe stenosis of the large first OM branch of the circumflex, and severe bifurcation stenosis of the LAD/first diagonal 3.  Right heart hemodynamics: RA a mean 3 mmHg RV 34/5 mmHg PA 28/12 mean 19 mmHg Cardiac output 4.7 L/min Cardiac index 2.35 L/min/m   Recommendations: Cardiac surgical consultation for consideration of aortic valve replacement/CABG  Echocardiogram: 07/2024 IMPRESSIONS     1. Left ventricular ejection fraction, by estimation, is 30 to 35%. Left  ventricular ejection fraction by 3D volume is 36 %. The left ventricle has  moderately decreased function. The left ventricle demonstrates regional  wall motion abnormalities (see  scoring diagram/findings for description). There is mild concentric left  ventricular hypertrophy. Left ventricular diastolic parameters are  indeterminate.   2. Right ventricular systolic function is normal. The right ventricular  size is normal. There is normal pulmonary artery systolic pressure. The  estimated right ventricular systolic pressure is 26.2 mmHg.   3. The mitral valve is degenerative. Trivial mitral valve regurgitation.   4. The aortic valve has been repaired/replaced. Aortic valve  regurgitation is not visualized. There is a 25 mm Edwards Resilia valve  present in the aortic position.  Aortic valve mean gradient measures 5.0  mmHg.   5. The inferior vena cava is normal in size with greater than 50%  respiratory variability, suggesting right atrial pressure of 3 mmHg.   Comparison(s): Prior images reviewed side by side. LVEF has decreased to  the range of 30-35%. Interval placement of bioprosthetic AVR with normal   function, mean AV gradient 5 mmHg.   Risk Assessment/Calculations:   {Does this patient have ATRIAL FIBRILLATION?[phone]} No BP recorded.  {Refresh Note OR Click here to enter BP  :1}***         Physical Exam:   VS:  There were no vitals taken for this visit.   Wt Readings from Last 3 Encounters:  08/03/24 177 lb (80.3 kg)  07/20/24 181 lb (82.1 kg)  07/08/24 178 lb (80.7 kg)     GEN: Well nourished, well developed in no acute distress NECK: No JVD; No carotid bruits CARDIAC: ***RRR, no murmurs, rubs, gallops RESPIRATORY:  Clear to auscultation without rales, wheezing or rhonchi  ABDOMEN: Appears non-distended. No obvious abdominal masses. EXTREMITIES: No clubbing or cyanosis. No edema.  Distal pedal pulses are 2+ bilaterally.   Assessment and Plan:   1. Coronary artery disease involving native coronary artery of native heart without angina pectoris - He recently underwent CABG with LIMA-LAD, SVG-PDA, SVG-OM and SVG-Diagonal in 06/2024 as discussed above.  2. S/P AVR -He underwent AVR with a 25 mm Edwards Resilia bioprosthetic valve in 06/2024 and this was functioning normally by recent echocardiogram. ***  3. HFrEF (heart failure with reduced ejection fraction) (HCC) -Most recent echocardiogram showed his EF was further reduced at 30 to 35%. ***  4. Postoperative atrial fibrillation (HCC) - He did have postoperative atrial fibrillation following CABG and AVR with no known recurrence. Plans are to stop Amiodarone  later this month. Not currently on anticoagulation.  5. Hyperlipidemia LDL goal <55 - LDL was at 36 when checked in 07/2024. Continue current medical therapy with Crestor  20 mg daily.   Signed, Laymon CHRISTELLA Qua, PA-C

## 2024-08-23 ENCOUNTER — Ambulatory Visit
Admission: RE | Admit: 2024-08-23 | Discharge: 2024-08-23 | Disposition: A | Discharge status: Home / Self Care | Source: Ambulatory Visit | Attending: Family Medicine | Admitting: Family Medicine

## 2024-08-23 DIAGNOSIS — Z8619 Personal history of other infectious and parasitic diseases: Secondary | ICD-10-CM

## 2024-09-07 ENCOUNTER — Encounter: Payer: Self-pay | Admitting: Internal Medicine

## 2024-10-11 ENCOUNTER — Ambulatory Visit: Attending: Internal Medicine | Admitting: Internal Medicine

## 2024-10-11 ENCOUNTER — Encounter: Payer: Self-pay | Admitting: Internal Medicine

## 2024-10-11 VITALS — BP 112/82 | HR 103 | Ht 71.0 in | Wt 180.0 lb

## 2024-10-11 DIAGNOSIS — I429 Cardiomyopathy, unspecified: Secondary | ICD-10-CM

## 2024-10-11 DIAGNOSIS — I25810 Atherosclerosis of coronary artery bypass graft(s) without angina pectoris: Secondary | ICD-10-CM | POA: Insufficient documentation

## 2024-10-11 NOTE — Patient Instructions (Signed)
 Medication Instructions:  Your physician has recommended you make the following change in your medication:   -Stop Amiodarone    *If you need a refill on your cardiac medications before your next appointment, please call your pharmacy*  Lab Work: None If you have labs (blood work) drawn today and your tests are completely normal, you will receive your results only by: MyChart Message (if you have MyChart) OR A paper copy in the mail If you have any lab test that is abnormal or we need to change your treatment, we will call you to review the results.  Testing/Procedures: Your physician has requested that you have an echocardiogram. Echocardiography is a painless test that uses sound waves to create images of your heart. It provides your doctor with information about the size and shape of your heart and how well your heart's chambers and valves are working. This procedure takes approximately one hour. There are no restrictions for this procedure. Please do NOT wear cologne, perfume, aftershave, or lotions (deodorant is allowed). Please arrive 15 minutes prior to your appointment time.  Please note: We ask at that you not bring children with you during ultrasound (echo/ vascular) testing. Due to room size and safety concerns, children are not allowed in the ultrasound rooms during exams. Our front office staff cannot provide observation of children in our lobby area while testing is being conducted. An adult accompanying a patient to their appointment will only be allowed in the ultrasound room at the discretion of the ultrasound technician under special circumstances. We apologize for any inconvenience.   Follow-Up: At [facility], you and your health needs are our priority.  As part of our continuing mission to provide you with exceptional heart care, our providers are all part of one team.  This team includes your primary Cardiologist (physician) and Advanced Practice Providers or  APPs (Physician Assistants and Nurse Practitioners) who all work together to provide you with the care you need, when you need it.  Your next appointment:   6 month(s)  Provider:   You may see Vishnu P Mallipeddi, MD or one of the following Advanced Practice Providers on your designated Care Team:   Brittany Strader, PA-C  [place], [place] Olivia [place], [place]     We recommend signing up for the patient portal called MyChart.  Sign up information is provided on this After Visit Summary.  MyChart is used to connect with patients for Virtual Visits (Telemedicine).  Patients are able to view lab/test results, encounter notes, upcoming appointments, etc.  Non-urgent messages can be sent to your provider as well.   To learn more about what you can do with MyChart, go to [email or web address].   Other Instructions

## 2024-10-11 NOTE — Progress Notes (Signed)
 Cardiology Office Note  Date: 10/11/2024   ID: Gordon Robinson, DOB November 06, 1950, MRN [id number]  PCP:  Jess Llano, MD  Cardiologist:  Diannah SHAUNNA Maywood, MD Electrophysiologist:  None   History of Present Illness: Gordon Robinson is a 73 y.o. 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  Does not have any symptoms after his procedures.  No angina or DOE.  No dizziness, syncope, palpitations, leg swelling.  Echocardiogram after TAVR showed LVEF 30 to 35% (prior to TAVR it was 40%) and normal aortic prosthesis.  Past Medical History:  Diagnosis Date   Aortic atherosclerosis    Aortic valve replaced    Arthritis    CAD (coronary artery disease)    a. 06/2024: CABG and AVR on 06/23/2024 by Dr. Lucas with LIMA-LAD, SVG-PDA, SVG-OM and SVG-Diagonal and AVR with a 25 mm Edwards Resilia Bioprosthetic Valve   Chronic kidney disease    Chronic systolic heart failure (HCC)    COPD (chronic obstructive pulmonary disease) (HCC)    Coronary artery disease 06/2024   Diabetes mellitus    type 2, does not check blood sugar   Hearing loss    bilateral hearing aids   Hypertension    Hypothyroidism    Sleep apnea    does not use CPAP - tested at [facility] yrs ago   Substance abuse (HCC)    hx cocaine    Past Surgical History:  Procedure Laterality Date   AORTIC VALVE REPLACEMENT N/A 06/23/2024   Procedure: REPLACEMENT, AORTIC VALVE, OPEN USING INSPIRIS RESILIA  AORTIC TISSUE VALVE;  Surgeon: Lucas Dorise POUR, MD;  Location: [facility] OR;  Service: Open Heart Surgery;  Laterality: N/A;   COLONOSCOPY     CORONARY ARTERY BYPASS GRAFT N/A 06/23/2024   Procedure: CORONARY ARTERY BYPASS GRAFTING (CABG) TIMES FOUR USING LEFT INTERNAL MAMMARY ARTERY AND ENDOSCOPICALLY HARVESTED RIGHT GREATER  SAPHENOUS VEIN;  Surgeon: Lucas Dorise POUR, MD;  Location: [facility] OR;  Service: Open Heart Surgery;  Laterality: N/A;   INTRAOPERATIVE TRANSESOPHAGEAL ECHOCARDIOGRAM N/A 06/23/2024   Procedure: ECHOCARDIOGRAM, TRANSESOPHAGEAL, INTRAOPERATIVE;  Surgeon: Lucas Dorise POUR, MD;  Location: [facility] OR;  Service: Open Heart Surgery;  Laterality: N/A;   LIVER BIOPSY     ORIF ACETABULAR FRACTURE Right 05/19/2023   Procedure: OPEN REDUCTION INTERNAL FIXATION (ORIF) ACETABULAR FRACTURE;  Surgeon: Celena Sharper, MD;  Location: [facility] OR;  Service: Orthopedics;  Laterality: Right;   RIGHT HEART CATH AND CORONARY ANGIOGRAPHY N/A 05/24/2024   Procedure: RIGHT HEART CATH AND CORONARY ANGIOGRAPHY;  Surgeon: Wonda Sharper, MD;  Location: [facility] INVASIVE CV LAB;  Service: Cardiovascular;  Laterality: N/A;    Current Outpatient Medications  Medication Sig Dispense Refill   acetaminophen  (TYLENOL ) 500 MG tablet Take 1-2 tablets (500-1,000 mg total) by mouth every 6 (six) hours as needed.     amiodarone  (PACERONE ) 200 MG tablet Take 1 tablet (200 mg total) by mouth daily. 46 tablet 0   aspirin  EC 81 MG tablet Take 1 tablet (81 mg total) by mouth daily. Swallow whole.     diclofenac Sodium (VOLTAREN) 1 % GEL Apply 2 g topically 3 (three) times daily as needed (pain).     empagliflozin  (JARDIANCE ) 10 MG TABS tablet Take 1 tablet (10 mg total) by mouth daily  before breakfast. 30 tablet 11   fluticasone  (FLONASE ) 50 MCG/ACT nasal spray Place 2 sprays into both nostrils daily.     insulin  glargine (LANTUS ) 100 UNIT/ML injection Inject 16-18 Units into the skin every morning.     levothyroxine  (SYNTHROID ) 137 MCG tablet Take 137 mcg by mouth daily before breakfast.     Multiple Vitamin (MULTIVITAMIN) tablet Take 1 tablet by mouth daily.     polyethylene glycol powder (MIRALAX ) 17 GM/SCOOP powder Take 17 g by mouth 2 (two) times daily as needed for moderate constipation.     rosuvastatin  (CRESTOR ) 20 MG tablet Take 1 tablet (20 mg total)  by mouth daily. 30 tablet 2   tamsulosin  (FLOMAX ) 0.4 MG CAPS capsule Take 0.4 mg by mouth at bedtime as needed (Prostate). (Patient taking differently: Take 0.4 mg by mouth at bedtime.)     No current facility-administered medications for this visit.   Allergies:  Patient has no known allergies.   Social History: The patient  reports that he quit smoking about 7 months ago. His smoking use included cigarettes. He started smoking about 55 years ago. He has a 27.7 pack-year smoking history. He has never used smokeless tobacco. He reports current alcohol  use. He reports current drug use. Drug: Cocaine.   Family History: The patient's family history is not on file.   ROS:  Please see the history of present illness. Otherwise, complete review of systems is positive for none.  All other systems are reviewed and negative.   Physical Exam: VS:  BP 112/82   Pulse (!) 103   Ht 5' 11 (1.803 m)   Wt 180 lb (81.6 kg)   SpO2 99%   BMI 25.10 kg/m , BMI Body mass index is 25.1 kg/m.  Wt Readings from Last 3 Encounters:  10/11/24 180 lb (81.6 kg)  08/03/24 177 lb (80.3 kg)  07/20/24 181 lb (82.1 kg)    General: Patient appears comfortable at rest. HEENT: Conjunctiva and lids normal, oropharynx clear with moist mucosa. Neck: Supple, no elevated JVP or carotid bruits, no thyromegaly. Lungs: Clear to auscultation, nonlabored breathing at rest. Cardiac: Regular rate and rhythm, ESM, S2 present Abdomen: Soft, nontender, no hepatomegaly, bowel sounds present, no guarding or rebound. Extremities: No pitting edema, distal pulses 2+. Skin: Warm and dry. Musculoskeletal: No kyphosis. Neuropsychiatric: Alert and oriented x3, affect grossly appropriate.  Recent Labwork: 06/21/2024: ALT 22; AST 19 06/24/2024: Magnesium  2.0 06/26/2024: Hemoglobin 9.3; Platelets 99 06/27/2024: BUN 14; Creatinine, Ser 1.00; Potassium 3.3; Sodium 136  No results found for: CHOL, TRIG, HDL, CHOLHDL, VLDL, LDLCALC,  LDLDIRECT   Assessment and Plan:  Chronic systolic heart failure, compensated: Post TAVR, LVEF was 30 to 35% in September 2025 (pre-TAVR, it was 40 to 45%).  Will repeat limited echocardiogram.  If limited echo confirms reduced LV systolic function, he is agreeable to start GDMT.  Will start with metoprolol  succinate 12.5 mg once daily and losartan 12.5 mg once daily.  GDMT uptitration will be limited due to soft blood pressures.  CAD s/p CABG (LIMA to LAD, SVG to diagonal, SVG to OM, SVG to PDA) in August 2025: Incision healing well.  No angina.  Continue aspirin  81 mg once daily, rosuvastatin  20 mg at bedtime.  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  SBE prophylaxis/antibiotics prior to dental procedures and dental cleanings twice per year.  HLD, unknown values: Continue rosuvastatin  20 mg nightly.  Goal LDL less than 70.  [facility]  Type II DM 2: Follows with PCP.  On Jardiance  25 mg once daily.   20 minutes spent in reviewing the medical records and speaking with the patient.  Answered all the questions.  8 minutes spent in documentation.  Medication Adjustments/Labs and Tests Ordered: Current medicines are reviewed at length with the patient today.  Concerns regarding medicines are outlined above.    Disposition:  Follow up 6 months  Signed, Jency Schnieders Arleta Maywood, MD, 10/11/2024 1:22 PM    [facility] [address] [place], [place] [address]

## 2024-11-15 ENCOUNTER — Ambulatory Visit (HOSPITAL_COMMUNITY)
Admission: RE | Admit: 2024-11-15 | Discharge: 2024-11-15 | Disposition: A | Discharge status: Home / Self Care | Source: Ambulatory Visit | Attending: Internal Medicine | Admitting: Internal Medicine

## 2024-11-15 DIAGNOSIS — I429 Cardiomyopathy, unspecified: Secondary | ICD-10-CM | POA: Diagnosis present

## 2024-11-15 MED ORDER — PERFLUTREN LIPID MICROSPHERE
1.0000 mL | INTRAVENOUS | Status: AC | PRN
  Administered 2024-11-15: 3 mL via INTRAVENOUS

## 2024-11-15 NOTE — Progress Notes (Signed)
*  PRELIMINARY RESULTS* Echocardiogram Limited 2-D Echocardiogram has been performed with Definity .  Gordon Robinson 11/15/2024, 11:42 AM

## 2024-11-16 LAB — ECHOCARDIOGRAM LIMITED
Calc EF: 37.7 %
S' Lateral: 4.2 cm
Single Plane A2C EF: 33.8 %
Single Plane A4C EF: 38.6 %

## 2024-11-18 NOTE — Discharge Summary (Signed)
 [facility] Gordon Robinson2026 Yes   Type 2 diabetes mellitus without complication, with long-term current use of insulin  (*) Robinson2026 Not Applicable   Primary hypertension [id number] Yes   Chronic heart failure with mildly reduced ejection fraction (HFmrEF) (*) Robinson2026 Yes   S/P CABG x 4 [id number] Not Applicable   Coronary artery disease involving native coronary artery of native heart 10/11/2024 Yes   S/P TAVR (transcatheter aortic valve replacement) 06/23/2024 Not Applicable   Severe aortic valve stenosis 12/25/2023 Yes   Mixed hyperlipidemia 05/15/2023 Yes   Benign prostatic hyperplasia with urinary obstruction 08/28/2020 Yes    Resolved Hospital Problems  No resolved problems to display.      Current Discharge Medication List     START taking these medications      Details  aspirin  81 mg chewable tablet Start taking on: November 19, 2024  Chew one tablet (81 mg dose) by mouth daily. Quantity: 30 tablet   clopidogrel bisulfate 75 mg tablet Commonly known as: PLAVIX Start taking on: November 19, 2024  Take one tablet (75 mg dose) by mouth daily for 21 days. Quantity: 21 tablet       CONTINUE these medications which have NOT CHANGED      Details  empagliflozin  10 mg Tabs tablet Commonly known as: JARDIANCE   Take one tablet (10 mg dose) by mouth every morning.   LANTUS  SOLOSTAR 100 UNIT/ML injection Generic drug: insulin  glargine  Inject ten Units to thirty six Units into the skin every morning. ** SLIDING SCALE **   lisinopril 5 mg tablet Commonly known as: PRINIVIL,ZESTRIL  Take one tablet (5 mg  dose) by mouth every morning.   multivitamin tablet  Take one tablet by mouth every morning.   rosuvastatin  calcium  20 mg tablet Commonly known as: CRESTOR   Take one tablet (20 mg dose) by mouth every morning.   tamsulosin  0.4 mg Caps Commonly known as: FLOMAX   Take one capsule (0.4 mg dose) by mouth every morning.   tiZANidine 4 mg tablet Commonly known as: ZANAFLEX  Take one tablet (4 mg dose) by mouth 2 (two) times daily.   zolpidem tartrate 10 mg tablet Commonly known as: AMBIEN  Take one tablet (10 mg dose) by mouth at bedtime as needed.      * You might also be taking other medications not listed above. If you have questions about any of your other medications, talk to the person who prescribed them or your Primary Care Provider.          STOP taking these medications    levothyroxine  sodium 150 mcg tablet Commonly known as: SYNTHROID ,LEVOTHROID,LEVOXYL        Reason for medication changes:See Below  Hospital Course   Indication for Admission/chief complaint: Left-sided weakness and numbness   HPI: Gordon Robinson is a 74 y.o. male with history of HFmrEF, CAD and severe arctic stenosis status post CABG x 4 and TAVR 06/2024, hypothyroidism, type 2 diabetes presented to the hospital due to left-sided weakness and numbness.  He states 2 days ago he has had left hand numbness and tingling with  associated weakness which moved later into his leg which lasted most of the day and then resolved.  He had no facial droop, slurred speech, aphasia.  Initially went to the [facility] who directed him to the ER.  He states his symptoms are completely resolved.   Hospital Course:       #  TIA (transient ischemic attack) vs tiny subacute stroke.  MRI brain as below, however his symptom course is more suggestive of TIA.  Discussed with neurology, even if it is a tiny stroke, management will not change.   Neuro recommending 21 days of dual antiplatelet therapy followed by aspirin  alone. CTA H/N:  Minimal atherosclerosis in the neck and carotid siphons. No stenosis, aneurysm or major vascular occlusion. TTE w/ known reduced EF 40-45% and no interatrial shunt.  Discussed with Dr. Gwenette and pt is ok to discharge and will f/u with neurology after discharge.     #   Chronic heart failure with mildly reduced ejection fraction (HFmrEF).   Echo as above.  Euvolemic on exam, continue home Jardiance  and lisinopril   #  Type 2 diabetes mellitus without complication, with long-term current use of insulin .  Weight-based Lantus , lispro, SSI in house and will continue home regimen at discharge   #  Severe aortic valve stenosis #  S/P TAVR (transcatheter aortic valve replacement).  Noted, last echo showed no issues with the valve   #  Primary hypertension.  Home lisinopril 5 mg daily   #  Mixed hyperlipidemia.  Continue statin   # Coronary artery disease involving native coronary artery of native heart s/p CABG x 4 (LIMA to LAD, SVG graft to diagonal OM and PDA) 06/2024.  Cont ASA + statin as above   #  Benign prostatic hyperplasia with urinary obstruction.  Cont home Flomax    #  Hypothyroidism.  TSH is suppressed and free T4 slightly elevated but free T3 is normal.  He does not complain of symptoms suggestive of hyperthyroidism.  I am going to have him hold his synthroid  and f/u with his PCP after discharge  Recommendations to physicians/followup needed: F/u PCP, monitor TFT's, f/u neurology  Physical Exam: Vitals:   11/18/24 0826  BP: 121/78  Pulse: 76  Resp: 16  Temp: 97.5 F (36.4 C)  SpO2: 99%    Constitutional - resting comfortably, no acute distress HEENT: Moist mucous membranes, icteric sclera     CV -regular rate and rhythm (+)S1S2, no murmurs; no JVD or peripheral edema  Resp - CTA bilaterally, no wheezing or crackles; no clubbing or cyanosis  GI - (+)BS, soft, non-tender, non-distended Skin - no rashes, sternotomy scar noted Neuro - alert, aware, oriented to  person/place/time, cranials 2 through 12 intact (taste hearing gag not tested) strength bilateral upper and lower extremities 5 out of 5, no loss of sensation to light touch, no pronator drift, dysmetria    Labs on Discharge:  Recent Labs    Units 11/18/24 0432 Robinson26 1415  WBC thou/mcL 6.6 7.2  HGB gm/dL 85.7 84.7  HCT % 55.9 52.2  PLT thou/mcL 98* 102*   Recent Labs    Units 11/18/24 0432 Robinson26 1415  NA mmol/L 139 138  K mmol/L 4.1 4.3  CL mmol/L 105 103  CO2 mmol/L 23 20  BUN mg/dL 19 23  CREATININE mg/dL 9.09 8.89  CALCIUM  mg/dL 9.3 9.3   Recent Labs    Units Robinson26 1415  BILITOT mg/dL 0.3  AST U/L 23  ALT U/L 22  ALKPHOS U/L 91  ALBUMIN  gm/dL 4.1   Recent Labs    Units 11/18/24 0432 Robinson26 1415  TSH mcIU/mL  --  0.02*  HGBA1C % 8.5*  --    Recent Labs    Units Robinson26 1415  INR  1.2   Recent Labs    Units Robinson26 1415  CHOL mg/dL 888  LDL mg/dL 51  HDL mg/dL 42  TRIG mg/dL 91   No results for input(s): TROPONIN, CK in the last 168 hours.  Invalid input(s): CK-MB  Diagnostics:  Echocardiogram Complete W Enhancing Agent  Final Result  Left Ventricle: EF: 40-45%.    Left Atrium: Injection of agitated saline documents no interatrial   shunt.      MRI Head WO Contrast  Final Result  IMPRESSION:  1. Tiny subacute infarct versus T2 shine through in the right centrum semiovale. No acute infarct.  2. Additional chronic findings as above.        Electronically Signed by: Harrie Copes on 11/17/2024 6:38 PM    CT Head WO Contrast  Final Result  IMPRESSION:  1. Minimal atherosclerosis in the neck and carotid siphons. No stenosis, aneurysm or major vascular occlusion.  2. Brain is normal for age.  3. Cervical spondylosis.  4. Left maxillary be issued sebaceous cyst or polyp with expansion of the medial wall. Ethmoid sinus disease as well.  5. Emphysema.    Degree of stenosis is determined using NASCET measurement technique:   Severe:  70-90%  Moderate:  50-69%  Mild:  Less than 50%            Electronically Signed by: Franky Cheshire MD, PHD on 11/17/2024 4:15 PM    CT Angio Head Neck  Final Result  IMPRESSION:  1. Minimal atherosclerosis in the neck and carotid siphons. No stenosis, aneurysm or major vascular occlusion.  2. Brain is normal for age.  3. Cervical spondylosis.  4. Left maxillary be issued sebaceous cyst or polyp with expansion of the medial wall. Ethmoid sinus disease as well.  5. Emphysema.    Degree of stenosis is determined using NASCET measurement technique:  Severe:  70-90%  Moderate:  50-69%  Mild:  Less than 50%            Electronically Signed by: Franky Cheshire MD, PHD on 11/17/2024 4:15 PM     Post Hospital Care   Discharge Procedure Orders  Ambulatory referral to Neurology  Standing Status: Future  Referral Priority: Routine Referral Type: Consultation  Referral Reason: Evaluate and Return  Number of Visits Requested: 1 Expiration Date: 05/16/25   Follow-up with Primary Care Physician  Standing Status: Future  Referral Priority: Routine Referral Type: Consultation  Referral Reason: Evaluate and Return  Number of Visits Requested: 1 Expiration Date: 05/16/25   Ambulatory referral to Neurology  Standing Status: Future  Referral Priority: Routine Referral Type: Consultation  Referral Reason: Evaluate and Return  Number of Visits Requested: 1 Expiration Date: 05/16/25   Consistent Carbohydrate Diet (diabetic)   Cardiac diet (heart healthy)   Notify physician - Temp  Order Comments: Call MD if Temperature above 100.3  Degrees F   Notify Physician for trouble breathing or symptoms that are worse   Notify Physician and Call 911 if you experience any of the following: Sudden numbness or weakness, sudden trouble speaking, vision problems, trouble walking, sudden severe headache with no known cause.   Notify Physician and CALL 911 if you experience Chest Pain  or discomfort, especially if associated  with tiredness, sick on stomach or sweaty feeling.   Notify Physician for dizziness when trying to stand   Notify Physician for chest pain / heart pain / chest heaviness   Notify Physician for palpitations (Fluttering in your chest)   Notify Physician for increase shortness of breath with activity or at rest   Notify Physician for confusion or disorientation   Notify Physician for difficulty swallowing   Activity as tolerated   Discharge instructions  Order Comments: Please see your primary care doctor in 1 week.  Please see neurology in 1-2 weeks.    Diet: Cardiac diet Cardiac Consistent Carbohydrate Diet (diabetic) Cardiac diet (heart healthy)  Potential for Rehab:        Good Code Status:   Full Code Disposition: Home Consults: Neurology  Followup appointments:  Future Appointments  Date Time Provider Department Center  12/06/2024  2:30 PM [facility] PROVIDER [facility] None     Time spent in discharge process:  total time spent 45 minutes   Electronically signed: Arthea CHRISTELLA Hoe, MD 11/18/2024 / 1:04 PM   *Some images could not be shown.

## 2024-11-21 ENCOUNTER — Ambulatory Visit: Payer: Self-pay | Admitting: Internal Medicine

## 2024-11-30 ENCOUNTER — Telehealth (HOSPITAL_COMMUNITY): Payer: Self-pay

## 2024-11-30 NOTE — Telephone Encounter (Signed)
 Called patient regarding a referral we received from [facility] for cardiac rehab. We discussed the cost of using his Medicare insurance verses the [facility] which would be no cost for the patient. Patient prefers to come through the [facility]. I notified him that he would have to request a referral and he stated he was seeing his PCP at the [facility] 2/9 and he would request a referral be sent to [facility] cardiac rehab.

## 2024-12-20 ENCOUNTER — Ambulatory Visit: Admitting: Physician Assistant
